# Patient Record
Sex: Female | Born: 1995 | Race: Black or African American | Hispanic: No | Marital: Married | Smoking: Never smoker
Health system: Southern US, Community
[De-identification: ages and names within clinical notes are randomized; demographics above are authoritative.]

## PROBLEM LIST (undated history)

## (undated) ENCOUNTER — Inpatient Hospital Stay (HOSPITAL_COMMUNITY): Payer: Self-pay

## (undated) ENCOUNTER — Inpatient Hospital Stay: Admission: EM | Payer: Self-pay | Source: Home / Self Care

## (undated) DIAGNOSIS — T7840XA Allergy, unspecified, initial encounter: Secondary | ICD-10-CM

## (undated) DIAGNOSIS — Z8613 Personal history of malaria: Secondary | ICD-10-CM

## (undated) HISTORY — DX: Allergy, unspecified, initial encounter: T78.40XA

## (undated) HISTORY — PX: NO PAST SURGERIES: SHX2092

## (undated) HISTORY — DX: Personal history of malaria: Z86.13

---
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
## 2015-04-29 ENCOUNTER — Encounter (HOSPITAL_COMMUNITY): Payer: Self-pay | Admitting: Emergency Medicine

## 2015-04-29 ENCOUNTER — Emergency Department (INDEPENDENT_AMBULATORY_CARE_PROVIDER_SITE_OTHER)
Admission: EM | Admit: 2015-04-29 | Discharge: 2015-04-29 | Disposition: A | Discharge status: Home / Self Care | Payer: Self-pay | Source: Home / Self Care | Attending: Emergency Medicine | Admitting: Emergency Medicine

## 2015-04-29 DIAGNOSIS — R110 Nausea: Secondary | ICD-10-CM

## 2015-04-29 DIAGNOSIS — Z3201 Encounter for pregnancy test, result positive: Secondary | ICD-10-CM

## 2015-04-29 DIAGNOSIS — M545 Low back pain, unspecified: Secondary | ICD-10-CM

## 2015-04-29 DIAGNOSIS — G44209 Tension-type headache, unspecified, not intractable: Secondary | ICD-10-CM

## 2015-04-29 LAB — POCT URINALYSIS DIP (DEVICE)
Bilirubin Urine: NEGATIVE
GLUCOSE, UA: NEGATIVE mg/dL
HGB URINE DIPSTICK: NEGATIVE
Ketones, ur: NEGATIVE mg/dL
LEUKOCYTES UA: NEGATIVE
NITRITE: NEGATIVE
Protein, ur: NEGATIVE mg/dL
Specific Gravity, Urine: 1.025 (ref 1.005–1.030)
UROBILINOGEN UA: 0.2 mg/dL (ref 0.0–1.0)
pH: 7 (ref 5.0–8.0)

## 2015-04-29 LAB — POCT PREGNANCY, URINE: PREG TEST UR: POSITIVE — AB

## 2015-04-29 MED ORDER — ONDANSETRON HCL 4 MG PO TABS: 4.0000 mg | ORAL_TABLET | Freq: Four times a day (QID) | ORAL | Status: DC

## 2015-04-29 NOTE — ED Notes (Signed)
Via [place] phone interpreter C/o HA onset 5 days associated w/chills, BA, weakness and intermittent fevers LMP = 12/2014 Arrived to [place] 5 days ago from [place] from [place]  Alert and oriented x4... No acute distress.

## 2015-04-29 NOTE — Discharge Instructions (Signed)
Back Pain, Adult Low back pain is very common. About 1 in 5 people have back pain.The cause of low back pain is rarely dangerous. The pain often gets better over time.About half of people with a sudden onset of back pain feel better in just 2 weeks. About 8 in 10 people feel better by 6 weeks.  CAUSES Some common causes of back pain include:  Strain of the muscles or ligaments supporting the spine.  Wear and tear (degeneration) of the spinal discs.  Arthritis.  Direct injury to the back. DIAGNOSIS Most of the time, the direct cause of low back pain is not known.However, back pain can be treated effectively even when the exact cause of the pain is unknown.Answering your caregiver's questions about your overall health and symptoms is one of the most accurate ways to make sure the cause of your pain is not dangerous. If your caregiver needs more information, he or she may order lab work or imaging tests (X-rays or MRIs).However, even if imaging tests show changes in your back, this usually does not require surgery. HOME CARE INSTRUCTIONS For many people, back pain returns.Since low back pain is rarely dangerous, it is often a condition that people can learn to [facility] their own.   Remain active. It is stressful on the back to sit or stand in one place. Do not sit, drive, or stand in one place for more than 30 minutes at a time. Take short walks on level surfaces as soon as pain allows.Try to increase the length of time you walk each day.  Do not stay in bed.Resting more than 1 or 2 days can delay your recovery.  Do not avoid exercise or work.Your body is made to move.It is not dangerous to be active, even though your back may hurt.Your back will likely heal faster if you return to being active before your pain is gone.  Pay attention to your body when you bend and lift. Many people have less discomfortwhen lifting if they bend their knees, keep the load close to their bodies,and  avoid twisting. Often, the most comfortable positions are those that put less stress on your recovering back.  Find a comfortable position to sleep. Use a firm mattress and lie on your side with your knees slightly bent. If you lie on your back, put a pillow under your knees.  Only take over-the-counter or prescription medicines as directed by your caregiver. Over-the-counter medicines to reduce pain and inflammation are often the most helpful.Your caregiver may prescribe muscle relaxant drugs.These medicines help dull your pain so you can more quickly return to your normal activities and healthy exercise.  Put ice on the injured area.  Put ice in a plastic bag.  Place a towel between your skin and the bag.  Leave the ice on for 15-20 minutes, 03-04 times a day for the first 2 to 3 days. After that, ice and heat may be alternated to reduce pain and spasms.  Ask your caregiver about trying back exercises and gentle massage. This may be of some benefit.  Avoid feeling anxious or stressed.Stress increases muscle tension and can worsen back pain.It is important to recognize when you are anxious or stressed and learn ways to manage it.Exercise is a great option. SEEK MEDICAL CARE IF:  You have pain that is not relieved with rest or medicine.  You have pain that does not improve in 1 week.  You have new symptoms.  You are generally not feeling well. SEEK  IMMEDIATE MEDICAL CARE IF:   You have pain that radiates from your back into your legs.  You develop new bowel or bladder control problems.  You have unusual weakness or numbness in your arms or legs.  You develop nausea or vomiting.  You develop abdominal pain.  You feel faint. Document Released: 07/19/2005 Document Revised: 01/18/2012 Document Reviewed: 11/20/2013 [facility] Patient Information 2015 [place], [place]. This information is not intended to replace advice given to you by your health care provider. Make sure you  discuss any questions you have with your health care provider.  Nausea, Adult zofran 4 mg every 6 h prn nausea Nausea means you feel sick to your stomach or need to throw up (vomit). It may be a sign of a more serious problem. If nausea gets worse, you may throw up. If you throw up a lot, you may lose too much body fluid (dehydration). HOME CARE   Get plenty of rest.  Ask your doctor how to replace body fluid losses (rehydrate).  Eat small amounts of food. Sip liquids more often.  Take all medicines as told by your doctor. GET HELP RIGHT AWAY IF:  You have a fever.  You pass out (faint).  You keep throwing up or have blood in your throw up.  You are very weak, have dry lips or a dry mouth, or you are very thirsty (dehydrated).  You have dark or bloody poop (stool).  You have very bad chest or belly (abdominal) pain.  You do not get better after 2 days, or you get worse.  You have a headache. MAKE SURE YOU:  Understand these instructions.  Will watch your condition.  Will get help right away if you are not doing well or get worse. Document Released: 07/08/2011 Document Revised: 10/11/2011 Document Reviewed: 07/08/2011 [facility] Patient Information 2015 [place], [place]. This information is not intended to replace advice given to you by your health care provider. Make sure you discuss any questions you have with your health care provider.  Prenatal Care  WHAT IS PRENATAL CARE?  Prenatal care means health care during your pregnancy, before your baby is born. It is very important to take care of yourself and your baby during your pregnancy by:   Getting early prenatal care. If you know you are pregnant, or think you might be pregnant, call your health care provider as soon as possible. Schedule a visit for a prenatal exam.  Getting regular prenatal care. Follow your health care provider's schedule for blood and other necessary tests. Do not miss appointments.  Doing  everything you can to keep yourself and your baby healthy during your pregnancy.  Getting complete care. Prenatal care should include evaluation of the medical, dietary, educational, psychological, and social needs of you and your significant other. The medical and genetic history of your family and the family of your baby's father should be discussed with your health care provider.  Discussing with your health care provider:  Prescription, over-the-counter, and herbal medicines that you take.  Any history of substance abuse, alcohol use, smoking, and illegal drug use.  Any history of domestic abuse and violence.  Immunizations you have received.  Your nutrition and diet.  The amount of exercise you do.  Any environmental and occupational hazards to which you are exposed.  History of sexually transmitted infections for both you and your partner.  Previous pregnancies you have had. WHY IS PRENATAL CARE SO IMPORTANT?  By regularly seeing your health care provider, you help  ensure that problems can be identified early so that they can be treated as soon as possible. Other problems might be prevented. Many studies have shown that early and regular prenatal care is important for the health of mothers and their babies.  HOW CAN I TAKE CARE OF MYSELF WHILE I AM PREGNANT?  Here are ways to take care of yourself and your baby:   Start or continue taking your multivitamin with 400 micrograms (mcg) of folic acid every day.  Get early and regular prenatal care. It is very important to see a health care provider during your pregnancy. Your health care provider will check at each visit to make sure that you and your baby are healthy. If there are any problems, action can be taken right away to help you and your baby.  Eat a healthy diet that includes:  Fruits.  Vegetables.  Foods low in saturated fat.  Whole grains.  Calcium-rich foods, such as milk, yogurt, and hard cheeses.  Drink 6-8  glasses of liquids a day.  Unless your health care provider tells you not to, try to be physically active for 30 minutes, most days of the week. If you are pressed for time, you can get your activity in through 10-minute segments, three times a day.  Do not smoke, drink alcohol, or use drugs. These can cause long-term damage to your baby. Talk with your health care provider about steps to take to stop smoking. Talk with a member of your faith community, a counselor, a trusted friend, or your health care provider if you are concerned about your alcohol or drug use.  Ask your health care provider before taking any medicine, even over-the-counter medicines. Some medicines are not safe to take during pregnancy.  Get plenty of rest and sleep.  Avoid hot tubs and saunas during pregnancy.  Do not have X-rays taken unless absolutely necessary and with the recommendation of your health care provider. A lead shield can be placed on your abdomen to protect your baby when X-rays are taken in other parts of your body.  Do not empty the cat litter when you are pregnant. It may contain a parasite that causes an infection called toxoplasmosis, which can cause birth defects. Also, use gloves when working in garden areas used by cats.  Do not eat uncooked or undercooked meats or fish.  Do not eat soft, mold-ripened cheeses (Brie, Camembert, and chevre) or soft, blue-veined cheese (Danish blue and Roquefort).  Stay away from toxic chemicals like:  Insecticides.  Solvents (some cleaners or paint thinners).  Lead.  Mercury.  Sexual intercourse may continue until the end of the pregnancy, unless you have a medical problem or there is a problem with the pregnancy and your health care provider tells you not to.  Do not wear high-heel shoes, especially during the second half of the pregnancy. You can lose your balance and fall.  Do not take long trips, unless absolutely necessary. Be sure to see your health  care provider before going on the trip.  Do not sit in one position for more than 2 hours when on a trip.  Take a copy of your medical records when going on a trip. Know where a hospital is located in the city you are visiting, in case of an emergency.  Most dangerous household products will have pregnancy warnings on their labels. Ask your health care provider about products if you are unsure.  Limit or eliminate your caffeine intake from coffee, tea,  sodas, medicines, and chocolate.  Many women continue working through pregnancy. Staying active might help you stay healthier. If you have a question about the safety or the hours you work at your particular job, talk with your health care provider.  Get informed:  Read books.  Watch videos.  Go to childbirth classes for you and your significant other.  Talk with experienced moms.  Ask your health care provider about childbirth education classes for you and your partner. Classes can help you and your partner prepare for the birth of your baby.  Ask about a baby doctor (pediatrician) and methods and pain medicine for labor, delivery, and possible cesarean delivery. HOW OFTEN SHOULD I SEE MY HEALTH CARE PROVIDER DURING PREGNANCY?  Your health care provider will give you a schedule for your prenatal visits. You will have visits more often as you get closer to the end of your pregnancy. An average pregnancy lasts about 40 weeks.  A typical schedule includes visiting your health care provider:   About once each month during your first 6 months of pregnancy.  Every 2 weeks during the next 2 months.  Weekly in the last month, until the delivery date. Your health care provider will probably want to see you more often if:  You are older than 35 years.  Your pregnancy is high risk because you have certain health problems or problems with the pregnancy, such as:  Diabetes.  High blood pressure.  The baby is not growing on schedule,  according to the dates of the pregnancy. Your health care provider will do special tests to make sure you and your baby are not having any serious problems. WHAT HAPPENS DURING PRENATAL VISITS?   At your first prenatal visit, your health care provider will do a physical exam and talk to you about your health history and the health history of your partner and your family. Your health care provider will be able to tell you what date to expect your baby to be born on.  Your first physical exam will include checks of your blood pressure, measurements of your height and weight, and an exam of your pelvic organs. Your health care provider will do a Pap test if you have not had one recently and will do cultures of your cervix to make sure there is no infection.  At each prenatal visit, there will be tests of your blood, urine, blood pressure, weight, and the progress of the baby will be checked.  At your later prenatal visits, your health care provider will check how you are doing and how your baby is developing. You may have a number of tests done as your pregnancy progresses.  Ultrasound exams are often used to check on your baby's growth and health.  You may have more urine and blood tests, as well as special tests, if needed. These may include amniocentesis to examine fluid in the pregnancy sac, stress tests to check how the baby responds to contractions, or a biophysical profile to measure your baby's well-being. Your health care provider will explain the tests and why they are necessary.  You should be tested for high blood sugar (gestational diabetes) between the 24th and 28th weeks of your pregnancy.  You should discuss with your health care provider your plans to breastfeed or bottle-feed your baby.  Each visit is also a chance for you to learn about staying healthy during pregnancy and to ask questions. Document Released: 07/22/2003 Document Revised: 07/24/2013 Document Reviewed:  10/03/2013 [employer]  Patient Information 2015 [place], [place]. This information is not intended to replace advice given to you by your health care provider. Make sure you discuss any questions you have with your health care provider.  Tension Headache Tylenol only for headache or pain A tension headache is a feeling of pain, pressure, or aching often felt over the front and sides of the head. The pain can be dull or can feel tight (constricting). It is the most common type of headache. Tension headaches are not normally associated with nausea or vomiting and do not get worse with physical activity. Tension headaches can last 30 minutes to several days.  CAUSES  The exact cause is not known, but it may be caused by chemicals and hormones in the brain that lead to pain. Tension headaches often begin after stress, anxiety, or depression. Other triggers may include:  Alcohol.  Caffeine (too much or withdrawal).  Respiratory infections (colds, flu, sinus infections).  Dental problems or teeth clenching.  Fatigue.  Holding your head and neck in one position too long while using a computer. SYMPTOMS   Pressure around the head.   Dull, aching head pain.   Pain felt over the front and sides of the head.   Tenderness in the muscles of the head, neck, and shoulders. DIAGNOSIS  A tension headache is often diagnosed based on:   Symptoms.   Physical examination.   A CT scan or MRI of your head. These tests may be ordered if symptoms are severe or unusual. TREATMENT  Medicines may be given to help relieve symptoms.  HOME CARE INSTRUCTIONS   Only take over-the-counter or prescription medicines for pain or discomfort as directed by your caregiver.   Lie down in a dark, quiet room when you have a headache.   Keep a journal to find out what may be triggering your headaches. For example, write down:  What you eat and drink.  How much sleep you get.  Any change to your diet or  medicines.  Try massage or other relaxation techniques.   Ice packs or heat applied to the head and neck can be used. Use these 3 to 4 times per day for 15 to 20 minutes each time, or as needed.   Limit stress.   Sit up straight, and do not tense your muscles.   Quit smoking if you smoke.  Limit alcohol use.  Decrease the amount of caffeine you drink, or stop drinking caffeine.  Eat and exercise regularly.  Get 7 to 9 hours of sleep, or as recommended by your caregiver.  Avoid excessive use of pain medicine as recurrent headaches can occur.  SEEK MEDICAL CARE IF:   You have problems with the medicines you were prescribed.  Your medicines do not work.  You have a change from the usual headache.  You have nausea or vomiting. SEEK IMMEDIATE MEDICAL CARE IF:   Your headache becomes severe.  You have a fever.  You have a stiff neck.  You have loss of vision.  You have muscular weakness or loss of muscle control.  You lose your balance or have trouble walking.  You feel faint or pass out.  You have severe symptoms that are different from your first symptoms. MAKE SURE YOU:   Understand these instructions.  Will watch your condition.  Will get help right away if you are not doing well or get worse. Document Released: 07/19/2005 Document Revised: 10/11/2011 Document Reviewed: 07/09/2011 [facility] Patient Information 2015 [place], [place]. This  information is not intended to replace advice given to you by your health care provider. Make sure you discuss any questions you have with your health care provider. ° °

## 2015-04-29 NOTE — ED Provider Notes (Signed)
CSN: [id number]     [id number] & time 04/29/15  1609 History   First MD Initiated Contact with Patient 04/29/15 1639     Chief Complaint  Patient presents with  . Headache   (Consider location/radiation/quality/duration/timing/severity/associated sxs/prior Treatment) HPI Comments: 19 year old female rolled [place] is brought to the urgent care by her caseworker for complaints of headache, chills and back pain for 5 days. She states the headache is located over her eyes and forehead. It is not associated with problems with vision speech, hearing or swallowing. Denies focal paresthesias or weakness. She states that she thinks she may have had fevers off and on for a while but does not have a thermometer. She is also complaining of back pain in the left para lumbar area. This pain is described as sharp and intermittent. It may come at any time at rest, while sitting or standing. It is not exacerbated or elicited  by specific movements. She also notes that her last menstrual period was 4 months ago. She is having early morning nausea. She denies vaginal bleeding or pelvic pain or discharge. She denies seeing vaginal bleeding or rectal bleeding. The history is provided by the patient. The history is limited by a language barrier. A language interpreter was used.    History reviewed. No pertinent past medical history. History reviewed. No pertinent past surgical history. No family history on file. Social History  Substance Use Topics  . Smoking status: Never Smoker   . Smokeless tobacco: None  . Alcohol Use: No   OB History    No data available     Review of Systems  Constitutional: Positive for fever. Negative for activity change and fatigue.  Eyes: Negative for visual disturbance.  Respiratory: Negative for cough, chest tightness and shortness of breath.   Cardiovascular: Negative for chest pain and leg swelling.  Gastrointestinal: Negative.    Genitourinary: Positive for menstrual problem. Negative for dysuria, urgency, frequency, vaginal bleeding, vaginal discharge and pelvic pain.  Skin: Negative for rash.  Neurological: Positive for headaches. Negative for speech difficulty.  All other systems reviewed and are negative.   Allergies  Review of patient's allergies indicates no known allergies.  Home Medications   Prior to Admission medications   Medication Sig Start Date End Date Taking? Authorizing Provider  ondansetron (ZOFRAN) 4 MG tablet Take 1 tablet (4 mg total) by mouth every 6 (six) hours. 04/29/15   Hayden Rasmussen, NP   Meds Ordered and Administered this Visit  Medications - No data to display  BP 102/65 mmHg  Pulse 72  Temp(Src) 98.3 F (36.8 C) (Oral)  Resp 16  SpO2 98%  LMP 12/27/2014 No data found.   Physical Exam  Constitutional: She is oriented to person, place, and time. She appears well-developed and well-nourished. No distress.  HENT:  Head: Normocephalic and atraumatic.  Mouth/Throat: Oropharynx is clear and moist. No oropharyngeal exudate.  Bilateral TMs are normal  Eyes: Conjunctivae and EOM are normal. Pupils are equal, round, and reactive to light.  Neck: Normal range of motion. Neck supple.  Cardiovascular: Normal rate, regular rhythm and normal heart sounds.   Pulmonary/Chest: Effort normal and breath sounds normal. No respiratory distress. She has no wheezes. She has no rales.  Abdominal: Soft. There is no tenderness.  Musculoskeletal: Normal range of motion. She exhibits no edema.  No tenderness is reproduced when palpating the area pain in the left low back. No spinal tenderness. No  deformity, swelling or discoloration.  Lymphadenopathy:    She has no cervical adenopathy.  Neurological: She is alert and oriented to person, place, and time. No cranial nerve deficit.  Skin: Skin is warm and dry.  Psychiatric: She has a normal mood and affect.  Nursing note and vitals reviewed.   ED  Course  Procedures (including critical care time)  Labs Review Labs Reviewed  POCT PREGNANCY, URINE - Abnormal; Notable for the following:    Preg Test, Ur POSITIVE (*)    All other components within normal limits  POCT URINALYSIS DIP (DEVICE)   Results for orders placed or performed during the hospital encounter of 04/29/15  POCT urinalysis dip (device)  Result Value Ref Range   Glucose, UA NEGATIVE NEGATIVE mg/dL   Bilirubin Urine NEGATIVE NEGATIVE   Ketones, ur NEGATIVE NEGATIVE mg/dL   Specific Gravity, Urine 1.025 1.005 - 1.030   Hgb urine dipstick NEGATIVE NEGATIVE   pH 7.0 5.0 - 8.0   Protein, ur NEGATIVE NEGATIVE mg/dL   Urobilinogen, UA 0.2 0.0 - 1.0 mg/dL   Nitrite NEGATIVE NEGATIVE   Leukocytes, UA NEGATIVE NEGATIVE  Pregnancy, urine POC  Result Value Ref Range   Preg Test, Ur POSITIVE (A) NEGATIVE     Imaging Review No results found.   Visual Acuity Review  Right Eye Distance:   Left Eye Distance:   Bilateral Distance:    Right Eye Near:   Left Eye Near:    Bilateral Near:         MDM   1. Pregnancy test positive   2. Nausea   3. Tension headache   4. Left-sided low back pain without sciatica    Patient given Zofran for her nausea. She is advised to take Tylenol as needed for headache but no other types of medication. She has no fever today and am unable to locate a source for history of fever. She is not advised to find a PCP as soon as possible and is given the name of an OB/GYN to follow-up with. She is to give this message to the caretaker and have her assist in obtaining these appointments. The time had bedside speaking to the patient, examination, history, physical and discharge instructions greater than 30 minutes due to language and cultural barriers.   Hayden Rasmussen, NP 04/29/15 2117

## 2015-05-07 ENCOUNTER — Encounter: Payer: Self-pay | Admitting: Obstetrics and Gynecology

## 2015-05-08 ENCOUNTER — Encounter (HOSPITAL_COMMUNITY): Payer: Self-pay | Admitting: *Deleted

## 2015-05-08 ENCOUNTER — Inpatient Hospital Stay (HOSPITAL_COMMUNITY)
Admission: AD | Admit: 2015-05-08 | Discharge: 2015-05-08 | Disposition: A | Discharge status: Home / Self Care | Payer: Self-pay | Source: Ambulatory Visit | Attending: Family Medicine | Admitting: Family Medicine

## 2015-05-08 DIAGNOSIS — O0932 Supervision of pregnancy with insufficient antenatal care, second trimester: Secondary | ICD-10-CM | POA: Insufficient documentation

## 2015-05-08 DIAGNOSIS — Z3A18 18 weeks gestation of pregnancy: Secondary | ICD-10-CM | POA: Insufficient documentation

## 2015-05-08 DIAGNOSIS — O210 Mild hyperemesis gravidarum: Secondary | ICD-10-CM | POA: Insufficient documentation

## 2015-05-08 DIAGNOSIS — O219 Vomiting of pregnancy, unspecified: Secondary | ICD-10-CM

## 2015-05-08 LAB — URINALYSIS, ROUTINE W REFLEX MICROSCOPIC
BILIRUBIN URINE: NEGATIVE
Glucose, UA: NEGATIVE mg/dL
HGB URINE DIPSTICK: NEGATIVE
KETONES UR: NEGATIVE mg/dL
Leukocytes, UA: NEGATIVE
NITRITE: NEGATIVE
PH: 7.5 (ref 5.0–8.0)
Protein, ur: NEGATIVE mg/dL
Specific Gravity, Urine: 1.015 (ref 1.005–1.030)
Urobilinogen, UA: 0.2 mg/dL (ref 0.0–1.0)

## 2015-05-08 MED ORDER — PROMETHAZINE HCL 25 MG PO TABS: 12.5000 mg | ORAL_TABLET | Freq: Four times a day (QID) | ORAL | Status: DC | PRN

## 2015-05-08 MED ORDER — PROMETHAZINE HCL 25 MG PO TABS
25.0000 mg | ORAL_TABLET | Freq: Once | ORAL | Status: AC
  Administered 2015-05-08: 25 mg via ORAL
  Filled 2015-05-08: qty 1

## 2015-05-08 MED ORDER — ACETAMINOPHEN 500 MG PO TABS
1000.0000 mg | ORAL_TABLET | Freq: Once | ORAL | Status: AC
  Administered 2015-05-08: 1000 mg via ORAL
  Filled 2015-05-08: qty 2

## 2015-05-08 NOTE — MAU Note (Signed)
Pt presents to MAU with complaints of nausea, vomiting, and dizziness that started today. Denies any vaginal bleeding or discharge

## 2015-05-08 NOTE — Discharge Instructions (Signed)

## 2015-05-08 NOTE — MAU Provider Note (Signed)
Chief Complaint: Headache; Emesis; and Dizziness   First Provider Initiated Contact with Patient 05/08/15 2110      SUBJECTIVE HPI: Sandra Sexton is a 19 y.o. G2P0 at [redacted]w[redacted]d by LMP who presents to maternity admissions reporting nausea, vomiting, h/a, and chills.  She reports some n/v throughout pregnancy but it became worse and was accompanied by chills and h/a in the last 4 days.  She was seen at [facility] on 9/27 for n/v and prescribed Zofran 4 mg tablets. These did help but she ran out of the medication.  She denies abdominal pain, vaginal bleeding, vaginal itching/burning, urinary symptoms, h/a, dizziness, n/v, or fever/chills.     Headache  This is a new problem. The current episode started in the past 7 days. The problem occurs constantly. The problem has been unchanged. The pain is located in the frontal region. The pain does not radiate. The pain quality is not similar to prior headaches. The quality of the pain is described as dull. The pain is mild. Pertinent negatives include no back pain, dizziness, fever, nausea, neck pain, photophobia, sinus pressure, vomiting or weakness. Nothing aggravates the symptoms. She has tried nothing for the symptoms.  Emesis  This is a recurrent problem. The current episode started 1 to 4 weeks ago. The problem occurs 5 to 10 times per day. The problem has been gradually worsening. The emesis has an appearance of stomach contents. There has been no fever. Pertinent negatives include no chest pain, chills, diarrhea, dizziness, fever, headaches or URI. She has tried nothing for the symptoms.    History reviewed. No pertinent past medical history. History reviewed. No pertinent past surgical history. Social History   Social History  . Marital Status: Single    Spouse Name: N/A  . Number of Children: N/A  . Years of Education: N/A   Occupational History  . Not on file.   Social History Main Topics  . Smoking status: Never Smoker   . Smokeless tobacco:  Not on file  . Alcohol Use: No  . Drug Use: No  . Sexual Activity: Not on file   Other Topics Concern  . Not on file   Social History Narrative   No current facility-administered medications on file prior to encounter.   Current Outpatient Prescriptions on File Prior to Encounter  Medication Sig Dispense Refill  . ondansetron (ZOFRAN) 4 MG tablet Take 1 tablet (4 mg total) by mouth every 6 (six) hours. 6 tablet 0   No Known Allergies  ROS:  Review of Systems  Constitutional: Negative for fever, chills and fatigue.  HENT: Negative for sinus pressure.   Eyes: Negative for photophobia.  Respiratory: Negative for shortness of breath.   Cardiovascular: Negative for chest pain.  Gastrointestinal: Negative for nausea, vomiting, diarrhea and constipation.  Genitourinary: Negative for dysuria, frequency, flank pain, vaginal bleeding, vaginal discharge, difficulty urinating, vaginal pain and pelvic pain.  Musculoskeletal: Negative for back pain and neck pain.  Neurological: Negative for dizziness, weakness and headaches.  Psychiatric/Behavioral: Negative.      I have reviewed patient's Past Medical Hx, Surgical Hx, Family Hx, Social Hx, medications and allergies.   Physical Exam   Patient Vitals for the past 24 hrs:  BP Temp Pulse Resp Height Weight  05/08/15 1811 - - - -  (1.549 m) 58.968 kg (130 lb)  05/08/15 1810 108/56 mmHg - 79 18 - -  05/08/15 1808 - 98.5 F (36.9 C) - - - -   Constitutional: Well-developed, well-nourished female  in no acute distress.  Cardiovascular: normal rate Respiratory: normal effort GI: Abd soft, non-tender. Pos BS x 4 MS: Extremities nontender, no edema, normal ROM Neurologic: Alert and oriented x 4.  GU: Neg CVAT.  FHT 158 by doppler  LAB RESULTS Results for orders placed or performed during the hospital encounter of 05/08/15 (from the past 24 hour(s))  Urinalysis, Routine w reflex microscopic (not at [facility])     Status: None    Collection Time: 05/08/15  6:10 PM  Result Value Ref Range   Color, Urine YELLOW YELLOW   APPearance CLEAR CLEAR   Specific Gravity, Urine 1.015 1.005 - 1.030   pH 7.5 5.0 - 8.0   Glucose, UA NEGATIVE NEGATIVE mg/dL   Hgb urine dipstick NEGATIVE NEGATIVE   Bilirubin Urine NEGATIVE NEGATIVE   Ketones, ur NEGATIVE NEGATIVE mg/dL   Protein, ur NEGATIVE NEGATIVE mg/dL   Urobilinogen, UA 0.2 0.0 - 1.0 mg/dL   Nitrite NEGATIVE NEGATIVE   Leukocytes, UA NEGATIVE NEGATIVE       IMAGING No results found.  MAU Management/MDM: Ordered labs and reviewed results.  Treatments in MAU included Phenergan 25 mg PO and Tylenol 1000 mg PO with pt report of symptom relief. Pt stable at time of discharge.  ASSESSMENT 1. Nausea and vomiting during pregnancy prior to [redacted] weeks gestation   2. Late prenatal care, second trimester     PLAN Discharge home Message sent to [facility] to start care r/t late to care Outpatient [place] ordered Phenergan Rx printed for pt   Follow-up Information    Follow up with [facility]   Specialty:  Obstetrics and Gynecology   Why:  The clinic will call you with appointment.   Contact information:   [address] [place] [address] [phone]      Follow up with [facility] [place] MATERNITY ADMISSIONS.   Why:  As needed for emergencies   Contact information:   [address] [id number] mc [place] [address] [phone]      Sharen Counter Certified Nurse-Midwife 05/08/2015  10:13 PM

## 2015-05-13 ENCOUNTER — Encounter: Payer: Self-pay | Admitting: Obstetrics and Gynecology

## 2015-05-20 ENCOUNTER — Encounter: Payer: Self-pay | Admitting: Family

## 2015-05-20 ENCOUNTER — Ambulatory Visit (INDEPENDENT_AMBULATORY_CARE_PROVIDER_SITE_OTHER): Payer: Self-pay | Admitting: Family

## 2015-05-20 VITALS — BP 104/58 | HR 87 | Temp 99.0°F | Wt 131.1 lb

## 2015-05-20 DIAGNOSIS — Z113 Encounter for screening for infections with a predominantly sexual mode of transmission: Secondary | ICD-10-CM

## 2015-05-20 DIAGNOSIS — Z3490 Encounter for supervision of normal pregnancy, unspecified, unspecified trimester: Secondary | ICD-10-CM | POA: Insufficient documentation

## 2015-05-20 DIAGNOSIS — Z789 Other specified health status: Secondary | ICD-10-CM

## 2015-05-20 DIAGNOSIS — Z3492 Encounter for supervision of normal pregnancy, unspecified, second trimester: Secondary | ICD-10-CM

## 2015-05-20 DIAGNOSIS — Z23 Encounter for immunization: Secondary | ICD-10-CM

## 2015-05-20 LAB — POCT URINALYSIS DIP (DEVICE)
Bilirubin Urine: NEGATIVE
GLUCOSE, UA: NEGATIVE mg/dL
Hgb urine dipstick: NEGATIVE
KETONES UR: NEGATIVE mg/dL
LEUKOCYTES UA: NEGATIVE
Nitrite: NEGATIVE
Protein, ur: NEGATIVE mg/dL
SPECIFIC GRAVITY, URINE: 1.02 (ref 1.005–1.030)
UROBILINOGEN UA: 0.2 mg/dL (ref 0.0–1.0)
pH: 7.5 (ref 5.0–8.0)

## 2015-05-20 NOTE — Progress Notes (Signed)
Breastfeeding tip of the week reviewed Initial prenatal labs today [employer] interpreter [phone]

## 2015-05-20 NOTE — Progress Notes (Signed)
    Subjective:    Sandra Sexton is a G2P1001 7755w4d being seen today for her first obstetrical visit.  Her obstetrical history is significant for current immigrant/refuge[place] from [place].  Entire family still in [place].  Pt states dropped off and gets home via bus - pt concerned ability to get home.   States was shown the bus stop to go to.  Patient does intend to breast feed. Pregnancy history fully reviewed.  Patient reports no complaints.  Filed Vitals:   05/20/15 0907  BP: 104/58  Pulse: 87  Temp: 99 F (37.2 C)  Weight: 131 lb 1.6 oz (59.467 kg)    HISTORY: OB History  Gravida Para Term Preterm AB SAB TAB Ectopic Multiple Living  2 1 1       1     # Outcome Date GA Lbr Len/2nd Weight Sex Delivery Anes PTL Lv  2 Current           1 Term 11/03/13    F Vag-Spont  N Y     Past Medical History  Diagnosis Date  . H/O malaria 2015    [place] in [place]   Past Surgical History  Procedure Laterality Date  . No past surgeries     History reviewed. No pertinent family history.   Exam   BP 104/58 mmHg  Pulse 87  Temp(Src) 99 F (37.2 C)  Wt 131 lb 1.6 oz (59.467 kg)  LMP 12/27/2014 (Approximate) Uterine Size: size equals dates  Pelvic Exam:    Perineum: No Hemorrhoids, Normal Perineum   Vulva: normal   Vagina:  normal mucosa, normal discharge, no palpable nodules   pH: Not done   Cervix: no bleeding, no cervical motion tenderness and no lesions   Adnexa: normal adnexa and no mass, fullness, tenderness   Bony Pelvis: Adequate  System: Breast:  No nipple retraction or dimpling, No nipple discharge or bleeding, No axillary or supraclavicular adenopathy, Normal to palpation without dominant masses   Skin: normal coloration and turgor, no rashes    Neurologic: negative   Extremities: normal strength, tone, and muscle mass   HEENT neck supple with midline trachea and thyroid without masses   Mouth/Teeth mucous membranes moist, pharynx normal without lesions    Neck supple and no masses   Cardiovascular: regular rate and rhythm, no murmurs or gallops   Respiratory:  appears well, vitals normal, no respiratory distress, acyanotic, normal RR, neck free of mass or lymphadenopathy, chest clear, no wheezing, crepitations, rhonchi, normal symmetric air entry   Abdomen: soft, non-tender; bowel sounds normal; no masses,  no organomegaly   Urinary: urethral meatus normal     Assessment:    Pregnancy: G2P1001 Patient Active Problem List   Diagnosis Date Noted  . Supervision of normal pregnancy 05/20/2015  Refugee      Plan:     Initial labs drawn. Prenatal vitamins. Given written information to give bus driver with address to assist with transportation.   Problem list reviewed and updated. Genetic Screening discussed Quad Screen: ordered.  Ultrasound discussed; fetal survey: ordered.  Follow up in 4 weeks.  Elenora FenderKARIM, [facility] Regional Medical CenterWALIDAH N 05/20/2015  *Utilized language line to receive and convey all information.

## 2015-05-21 ENCOUNTER — Ambulatory Visit (HOSPITAL_COMMUNITY): Admission: RE | Admit: 2015-05-21 | Payer: Self-pay | Source: Ambulatory Visit

## 2015-05-21 ENCOUNTER — Other Ambulatory Visit (HOSPITAL_COMMUNITY): Payer: Self-pay | Admitting: Advanced Practice Midwife

## 2015-05-21 DIAGNOSIS — O0932 Supervision of pregnancy with insufficient antenatal care, second trimester: Secondary | ICD-10-CM

## 2015-05-21 DIAGNOSIS — Z3689 Encounter for other specified antenatal screening: Secondary | ICD-10-CM

## 2015-05-21 DIAGNOSIS — Z3A20 20 weeks gestation of pregnancy: Secondary | ICD-10-CM

## 2015-05-21 LAB — PRESCRIPTION MONITORING PROFILE (SOLSTAS)
Amphetamine/Meth: NEGATIVE ng/mL
BARBITURATE SCREEN, URINE: NEGATIVE ng/mL
BENZODIAZEPINE SCREEN, URINE: NEGATIVE ng/mL
BUPRENORPHINE, URINE: NEGATIVE ng/mL
COCAINE METABOLITES: NEGATIVE ng/mL
CREATININE, URINE: 164.16 mg/dL (ref >20.0)
Cannabinoid Scrn, Ur: NEGATIVE ng/mL
Carisoprodol, Urine: NEGATIVE ng/mL
ECSTASY: NEGATIVE ng/mL
FENTANYL URINE: NEGATIVE ng/mL
Meperidine, Ur: NEGATIVE ng/mL
Methadone Screen, Urine: NEGATIVE ng/mL
Nitrites, Initial: NEGATIVE ug/mL
OPIATE SCREEN, URINE: NEGATIVE ng/mL
Oxycodone Screen, Ur: NEGATIVE ng/mL
PROPOXYPHENE: NEGATIVE ng/mL
TAPENTADOLUR: NEGATIVE ng/mL
Tramadol Scrn, Ur: NEGATIVE ng/mL
Zolpidem, Urine: NEGATIVE ng/mL
pH, Initial: 7.5 pH (ref 4.5–8.9)

## 2015-05-21 LAB — OBSTETRIC PANEL
Antibody Screen: NEGATIVE
BASOS ABS: 0 10*3/uL (ref 0.0–0.1)
BASOS PCT: 0 % (ref 0–1)
EOS ABS: 0.2 10*3/uL (ref 0.0–0.7)
Eosinophils Relative: 4 % (ref 0–5)
HEMATOCRIT: 38.2 % (ref 36.0–46.0)
HEMOGLOBIN: 12.7 g/dL (ref 12.0–15.0)
Hepatitis B Surface Ag: NEGATIVE
LYMPHS ABS: 1.8 10*3/uL (ref 0.7–4.0)
Lymphocytes Relative: 42 % (ref 12–46)
MCH: 29.7 pg (ref 26.0–34.0)
MCHC: 33.2 g/dL (ref 30.0–36.0)
MCV: 89.5 fL (ref 78.0–100.0)
MPV: 12.4 fL (ref 8.6–12.4)
Monocytes Absolute: 0.2 10*3/uL (ref 0.1–1.0)
Monocytes Relative: 5 % (ref 3–12)
NEUTROS PCT: 49 % (ref 43–77)
Neutro Abs: 2.1 10*3/uL (ref 1.7–7.7)
Platelets: 169 10*3/uL (ref 150–400)
RBC: 4.27 MIL/uL (ref 3.87–5.11)
RDW: 12.6 % (ref 11.5–15.5)
RUBELLA: 23 {index} — AB (ref <0.90)
Rh Type: POSITIVE
WBC: 4.2 10*3/uL (ref 4.0–10.5)

## 2015-05-21 LAB — GC/CHLAMYDIA PROBE AMP (~~LOC~~) NOT AT ARMC
Chlamydia: NEGATIVE
NEISSERIA GONORRHEA: NEGATIVE

## 2015-05-21 LAB — CULTURE, OB URINE
Colony Count: NO GROWTH
Organism ID, Bacteria: NO GROWTH

## 2015-05-22 LAB — HEMOGLOBINOPATHY EVALUATION
HEMOGLOBIN OTHER: 0 %
Hgb A2 Quant: 3 % (ref 2.2–3.2)
Hgb A: 96.5 % — ABNORMAL LOW (ref 96.8–97.8)
Hgb F Quant: 0.5 % (ref 0.0–2.0)
Hgb S Quant: 0 %

## 2015-05-26 ENCOUNTER — Ambulatory Visit (HOSPITAL_COMMUNITY)
Admission: RE | Admit: 2015-05-26 | Discharge: 2015-05-26 | Disposition: A | Discharge status: Home / Self Care | Payer: Self-pay | Source: Ambulatory Visit | Attending: Advanced Practice Midwife | Admitting: Advanced Practice Midwife

## 2015-05-26 DIAGNOSIS — Z3A20 20 weeks gestation of pregnancy: Secondary | ICD-10-CM | POA: Insufficient documentation

## 2015-05-26 DIAGNOSIS — Z3492 Encounter for supervision of normal pregnancy, unspecified, second trimester: Secondary | ICD-10-CM

## 2015-05-26 DIAGNOSIS — Z36 Encounter for antenatal screening of mother: Secondary | ICD-10-CM | POA: Insufficient documentation

## 2015-05-26 DIAGNOSIS — O0932 Supervision of pregnancy with insufficient antenatal care, second trimester: Secondary | ICD-10-CM | POA: Insufficient documentation

## 2015-05-26 DIAGNOSIS — Z3689 Encounter for other specified antenatal screening: Secondary | ICD-10-CM

## 2015-05-27 ENCOUNTER — Encounter: Payer: Self-pay | Admitting: Family

## 2015-06-04 ENCOUNTER — Telehealth: Payer: Self-pay | Admitting: Family

## 2015-06-04 LAB — AFP, QUAD SCREEN
AFP: 34.3 ng/mL
Age Alone: 1:1170 {titer}
CURR GEST AGE: 14.5 wks.days
HCG, Total: 61.19 IU/mL
INH: 97.4 pg/mL
INTERPRETATION-AFP: NEGATIVE
MOM FOR AFP: 1.19
MoM for INH: 0.48
MoM for hCG: 1.01
Open Spina bifida: NEGATIVE
TRI 18 SCR RISK EST: NEGATIVE
UE3 MOM: 0.95
uE3 Value: 0.56 ng/mL

## 2015-06-04 NOTE — Telephone Encounter (Signed)
Pt called to inform regarding needing to redraw or recalculate QUAD screen.  Results based on incorrect EGA by ultrasound.  EDC changed.  Will redraw at appt on 06/17/15.

## 2015-06-17 ENCOUNTER — Ambulatory Visit (INDEPENDENT_AMBULATORY_CARE_PROVIDER_SITE_OTHER): Payer: Medicaid Other | Admitting: Advanced Practice Midwife

## 2015-06-17 ENCOUNTER — Encounter: Payer: Self-pay | Admitting: Advanced Practice Midwife

## 2015-06-17 VITALS — BP 106/52 | HR 74 | Temp 98.2°F | Wt 139.0 lb

## 2015-06-17 DIAGNOSIS — Z3492 Encounter for supervision of normal pregnancy, unspecified, second trimester: Secondary | ICD-10-CM

## 2015-06-17 LAB — POCT URINALYSIS DIP (DEVICE)
BILIRUBIN URINE: NEGATIVE
Glucose, UA: NEGATIVE mg/dL
HGB URINE DIPSTICK: NEGATIVE
Ketones, ur: NEGATIVE mg/dL
LEUKOCYTES UA: NEGATIVE
NITRITE: NEGATIVE
Protein, ur: NEGATIVE mg/dL
Specific Gravity, Urine: 1.02 (ref 1.005–1.030)
UROBILINOGEN UA: 0.2 mg/dL (ref 0.0–1.0)
pH: 7 (ref 5.0–8.0)

## 2015-06-17 MED ORDER — PRENATAL VITAMINS 0.8 MG PO TABS: 1.0000 | ORAL_TABLET | Freq: Every day | ORAL | Status: DC

## 2015-06-17 NOTE — Progress Notes (Signed)
Subjective:  Sandra Sexton is a 19 y.o. G2P1001 at 6665w5d being seen today for ongoing prenatal care.  Patient reports no complaints.  Contractions: Not present.  Vag. Bleeding: None. Movement: Present. Denies leaking of fluid.   The following portions of the patient's history were reviewed and updated as appropriate: allergies, current medications, past family history, past medical history, past social history, past surgical history and problem list. Problem list updated.  Objective:   Filed Vitals:   06/17/15 1011  BP: 106/52  Pulse: 74  Temp: 98.2 F (36.8 C)  Weight: 139 lb (63.05 kg)    Fetal Status: Fetal Heart Rate (bpm): 151   Movement: Present     General:  Alert, oriented and cooperative. Patient is in no acute distress.  Skin: Skin is warm and dry. No rash noted.   Cardiovascular: Normal heart rate noted  Respiratory: Normal respiratory effort, no problems with respiration noted  Abdomen: Soft, gravid, appropriate for gestational age. Pain/Pressure: Absent     Pelvic: Vag. Bleeding: None     Cervical exam deferred        Extremities: Normal range of motion.  Edema: None  Mental Status: Normal mood and affect. Normal behavior. Normal judgment and thought content.   Urinalysis: Urine Protein: Negative Urine Glucose: Negative  Assessment and Plan:  Pregnancy: G2P1001 at 3665w5d  1. Supervision of normal pregnancy, second trimester       - US MFM OB FOLLOW UP; Future  Preterm labor symptoms and general obstetric precautions including but not limited to vaginal bleeding, contractions, leaking of fluid and fetal movement were reviewed in detail with the patient. Please refer to After Visit Summary for other counseling recommendations.   RTC in 4 weeks   Aviva SignsMarie L Wilma Wuthrich, CNM

## 2015-06-17 NOTE — Progress Notes (Signed)
Patient reports headache for past 3 days; denies dizziness or blurry vision; discussed using tylenol as needed [place] interpreter [phone] Discussed new edd with patient Informed patient of u/s appt.

## 2015-06-17 NOTE — Patient Instructions (Signed)

## 2015-06-20 ENCOUNTER — Ambulatory Visit (HOSPITAL_COMMUNITY)
Admission: RE | Admit: 2015-06-20 | Discharge: 2015-06-20 | Disposition: A | Discharge status: Home / Self Care | Payer: Medicaid Other | Source: Ambulatory Visit | Attending: Advanced Practice Midwife | Admitting: Advanced Practice Midwife

## 2015-06-20 ENCOUNTER — Other Ambulatory Visit: Payer: Self-pay | Admitting: General Practice

## 2015-06-20 DIAGNOSIS — Z1389 Encounter for screening for other disorder: Secondary | ICD-10-CM

## 2015-06-20 DIAGNOSIS — Z3A19 19 weeks gestation of pregnancy: Secondary | ICD-10-CM | POA: Diagnosis not present

## 2015-06-20 DIAGNOSIS — Z36 Encounter for antenatal screening of mother: Secondary | ICD-10-CM | POA: Diagnosis not present

## 2015-06-20 DIAGNOSIS — Z3492 Encounter for supervision of normal pregnancy, unspecified, second trimester: Secondary | ICD-10-CM

## 2015-07-15 ENCOUNTER — Encounter: Payer: Self-pay | Admitting: Certified Nurse Midwife

## 2015-07-15 ENCOUNTER — Encounter: Payer: Medicaid Other | Admitting: Certified Nurse Midwife

## 2015-07-22 ENCOUNTER — Encounter: Payer: Medicaid Other | Admitting: Student

## 2015-07-23 ENCOUNTER — Ambulatory Visit (INDEPENDENT_AMBULATORY_CARE_PROVIDER_SITE_OTHER): Payer: Medicaid Other | Admitting: Advanced Practice Midwife

## 2015-07-23 VITALS — BP 103/55 | HR 79 | Temp 98.4°F | Wt 145.1 lb

## 2015-07-23 DIAGNOSIS — Z3492 Encounter for supervision of normal pregnancy, unspecified, second trimester: Secondary | ICD-10-CM | POA: Diagnosis not present

## 2015-07-23 LAB — POCT URINALYSIS DIP (DEVICE)
BILIRUBIN URINE: NEGATIVE
Glucose, UA: NEGATIVE mg/dL
Hgb urine dipstick: NEGATIVE
Ketones, ur: NEGATIVE mg/dL
LEUKOCYTES UA: NEGATIVE
NITRITE: NEGATIVE
Protein, ur: NEGATIVE mg/dL
Specific Gravity, Urine: 1.02 (ref 1.005–1.030)
Urobilinogen, UA: 0.2 mg/dL (ref 0.0–1.0)
pH: 7.5 (ref 5.0–8.0)

## 2015-07-23 NOTE — Progress Notes (Signed)
Subjective:  Sandra Sexton is a 19 y.o. G2P1001 at 6455w6d being seen today for ongoing prenatal care.  She is currently monitored for the following issues for this low-risk pregnancy and has Supervision of normal pregnancy and Language barrier affecting health care on her problem list.  Patient reports no complaints.  Contractions: Not present. Vag. Bleeding: None.  Movement: Present. Denies leaking of fluid.   The following portions of the patient's history were reviewed and updated as appropriate: allergies, current medications, past family history, past medical history, past social history, past surgical history and problem list. Problem list updated.  Objective:   Filed Vitals:   07/23/15 1009  BP: 103/55  Pulse: 79  Temp: 98.4 F (36.9 C)  Weight: 145 lb 1.6 oz (65.817 kg)    Fetal Status: Fetal Heart Rate (bpm): 143   Movement: Present     General:  Alert, oriented and cooperative. Patient is in no acute distress.  Skin: Skin is warm and dry. No rash noted.   Cardiovascular: Normal heart rate noted  Respiratory: Normal respiratory effort, no problems with respiration noted  Abdomen: Soft, gravid, appropriate for gestational age. Pain/Pressure: Absent     Pelvic: Vag. Bleeding: None     Cervical exam deferred        Extremities: Normal range of motion.  Edema: None  Mental Status: Normal mood and affect. Normal behavior. Normal judgment and thought content.   Urinalysis: Urine Protein: Negative Urine Glucose: Negative  Assessment and Plan:  Pregnancy: G2P1001 at 4955w6d  1. Supervision of normal pregnancy, second trimester   Preterm labor symptoms and general obstetric precautions including but not limited to vaginal bleeding, contractions, leaking of fluid and fetal movement were reviewed in detail with the patient. Please refer to After Visit Summary for other counseling recommendations.  Return in about 4 weeks (around 08/20/2015).   Hurshel PartyLisa A Leftwich-Kirby, CNM

## 2015-07-23 NOTE — Progress Notes (Signed)
[employer] Interpreter # [phone]

## 2015-08-03 NOTE — L&D Delivery Note (Signed)
Delivery Note At 2:45 PM a viable and healthy female was delivered via Vaginal, Spontaneous Delivery (Presentation: Right Occiput Anterior).  Baby slow to transition, but good tone and HR. APGAR: 8, 9; weight 7 lb 6 oz (3345 g).   Placenta status: Intact, Spontaneous.  Cord: 3 vessels with the following complications: None.  Cord pH: NA  Anesthesia: Nitrous  Episiotomy: None Lacerations: None Suture Repair: NA Est. Blood Loss (mL): 50  Mom to postpartum.  Baby to Couplet care / Skin to Skin. Placenta to: BS Feeding: Breast Circ: NA Contraception: depo  Sandra Sexton 11/15/2015, 4:16 PM

## 2015-08-25 ENCOUNTER — Ambulatory Visit (INDEPENDENT_AMBULATORY_CARE_PROVIDER_SITE_OTHER): Payer: Medicaid Other | Admitting: Advanced Practice Midwife

## 2015-08-25 VITALS — BP 104/59 | HR 71 | Temp 98.3°F | Wt 150.5 lb

## 2015-08-25 DIAGNOSIS — Z3492 Encounter for supervision of normal pregnancy, unspecified, second trimester: Secondary | ICD-10-CM

## 2015-08-25 DIAGNOSIS — Z789 Other specified health status: Secondary | ICD-10-CM

## 2015-08-25 NOTE — Progress Notes (Signed)
Breastfeeding tip of the week reviewed Needs 28 week labs scheduled

## 2015-08-25 NOTE — Progress Notes (Signed)
Subjective:  Sandra Sexton is a 20 y.o. G2P1001 at [redacted]w[redacted]d being seen today for ongoing prenatal care.  She is currently monitored for the following issues for this low-risk pregnancy and has Supervision of normal pregnancy and Language barrier affecting health care on her problem list.  Patient reports no complaints.  Contractions: Not present. Vag. Bleeding: None.  Movement: Present. Denies leaking of fluid.   The following portions of the patient's history were reviewed and updated as appropriate: allergies, current medications, past family history, past medical history, past social history, past surgical history and problem list. Problem list updated.  Objective:   Filed Vitals:   08/25/15 1539  BP: 104/59  Pulse: 71  Temp: 98.3 F (36.8 C)  Weight: 150 lb 8 oz (68.266 kg)    Fetal Status: Fetal Heart Rate (bpm): 145   Movement: Present     General:  Alert, oriented and cooperative. Patient is in no acute distress.  Skin: Skin is warm and dry. No rash noted.   Cardiovascular: Normal heart rate noted  Respiratory: Normal respiratory effort, no problems with respiration noted  Abdomen: Soft, gravid, appropriate for gestational age. Pain/Pressure: Present     Pelvic: Vag. Bleeding: None Vag D/C Character: Watery   Cervical exam deferred        Extremities: Normal range of motion.  Edema: None  Mental Status: Normal mood and affect. Normal behavior. Normal judgment and thought content.   Urinalysis: Urine Protein: Negative Urine Glucose: Negative  Assessment and Plan:  Pregnancy: G2P1001 at [redacted]w[redacted]d  1. Supervision of normal pregnancy, second trimester --28 week labs not drawn, pt called back to room too late to draw during office hours.  Pt was here for appt on time so for her convenience, pt rescheduled for labs and OB visit next week.  Then can return to Q 2 week visits.   2. Language barrier affecting health care  Preterm labor symptoms and general obstetric precautions  including but not limited to vaginal bleeding, contractions, leaking of fluid and fetal movement were reviewed in detail with the patient. Please refer to After Visit Summary for other counseling recommendations.  Return in about 1 week (around 09/01/2015).   Hurshel Party, CNM

## 2015-09-01 ENCOUNTER — Ambulatory Visit (INDEPENDENT_AMBULATORY_CARE_PROVIDER_SITE_OTHER): Payer: Medicaid Other | Admitting: Obstetrics and Gynecology

## 2015-09-01 VITALS — BP 106/58 | HR 79 | Temp 98.3°F | Wt 151.7 lb

## 2015-09-01 DIAGNOSIS — Z3493 Encounter for supervision of normal pregnancy, unspecified, third trimester: Secondary | ICD-10-CM | POA: Diagnosis present

## 2015-09-01 DIAGNOSIS — Z3490 Encounter for supervision of normal pregnancy, unspecified, unspecified trimester: Secondary | ICD-10-CM

## 2015-09-01 DIAGNOSIS — Z23 Encounter for immunization: Secondary | ICD-10-CM

## 2015-09-01 LAB — POCT URINALYSIS DIP (DEVICE)
BILIRUBIN URINE: NEGATIVE
GLUCOSE, UA: NEGATIVE mg/dL
Hgb urine dipstick: NEGATIVE
KETONES UR: NEGATIVE mg/dL
LEUKOCYTES UA: NEGATIVE
Nitrite: NEGATIVE
PROTEIN: NEGATIVE mg/dL
Specific Gravity, Urine: 1.02 (ref 1.005–1.030)
Urobilinogen, UA: 0.2 mg/dL (ref 0.0–1.0)
pH: 7 (ref 5.0–8.0)

## 2015-09-01 LAB — CBC
HCT: 34.1 % — ABNORMAL LOW (ref 36.0–46.0)
Hemoglobin: 11.4 g/dL — ABNORMAL LOW (ref 12.0–15.0)
MCH: 31.1 pg (ref 26.0–34.0)
MCHC: 33.4 g/dL (ref 30.0–36.0)
MCV: 92.9 fL (ref 78.0–100.0)
MPV: 10.8 fL (ref 8.6–12.4)
PLATELETS: 151 10*3/uL (ref 150–400)
RBC: 3.67 MIL/uL — ABNORMAL LOW (ref 3.87–5.11)
RDW: 12.3 % (ref 11.5–15.5)
WBC: 5.1 10*3/uL (ref 4.0–10.5)

## 2015-09-01 MED ORDER — TETANUS-DIPHTH-ACELL PERTUSSIS 5-2.5-18.5 LF-MCG/0.5 IM SUSP
0.5000 mL | Freq: Once | INTRAMUSCULAR | Status: AC
  Administered 2015-09-01: 0.5 mL via INTRAMUSCULAR

## 2015-09-01 NOTE — Progress Notes (Signed)
Subjective:  Sandra Sexton is a 20 y.o. G2P1001 at [redacted]w[redacted]d being seen today for ongoing prenatal care.  She is currently monitored for the following issues for this low-risk pregnancy and has Supervision of normal pregnancy and Language barrier affecting health care on her problem list.  Patient reports no complaints.  Contractions: Not present. Vag. Bleeding: None.  Movement: Present. Denies leaking of fluid.   The following portions of the patient's history were reviewed and updated as appropriate: allergies, current medications, past family history, past medical history, past social history, past surgical history and problem list. Problem list updated.  Objective:   Filed Vitals:   09/01/15 1202  BP: 106/58  Pulse: 79  Temp: 98.3 F (36.8 C)  Weight: 151 lb 11.2 oz (68.811 kg)    Fetal Status: Fetal Heart Rate (bpm): 140   Movement: Present     General:  Alert, oriented and cooperative. Patient is in no acute distress.  Skin: Skin is warm and dry. No rash noted.   Cardiovascular: Normal heart rate noted  Respiratory: Normal respiratory effort, no problems with respiration noted  Abdomen: Soft, gravid, appropriate for gestational age. Pain/Pressure: Absent     Pelvic: Vag. Bleeding: None     Cervical exam deferred        Extremities: Normal range of motion.  Edema: None  Mental Status: Normal mood and affect. Normal behavior. Normal judgment and thought content.   Urinalysis: Urine Protein: Negative Urine Glucose: Negative  Assessment and Plan:  Pregnancy: G2P1001 at [redacted]w[redacted]d  1. Supervision of normal pregnancy, unspecified trimester - Glucose Tolerance, 1 HR (50g) w/o Fasting - HIV antibody (with reflex) - RPR - CBC - larc discussin today  Preterm labor symptoms and general obstetric precautions including but not limited to vaginal bleeding, contractions, leaking of fluid and fetal movement were reviewed in detail with the patient. Please refer to After Visit Summary for  other counseling recommendations.  F/u 4 weeks  Kathrynn Running, MD

## 2015-09-01 NOTE — Progress Notes (Signed)
kinyar[place] interpreter [phone] 1hr gtt, 28 week labs today Tdap today

## 2015-09-02 LAB — RPR

## 2015-09-02 LAB — HIV ANTIBODY (ROUTINE TESTING W REFLEX): HIV: NONREACTIVE

## 2015-09-02 LAB — GLUCOSE TOLERANCE, 1 HOUR (50G) W/O FASTING: GLUCOSE 1 HOUR GTT: 55 mg/dL — AB (ref 70–140)

## 2015-09-16 ENCOUNTER — Ambulatory Visit (INDEPENDENT_AMBULATORY_CARE_PROVIDER_SITE_OTHER): Payer: Medicaid Other | Admitting: Advanced Practice Midwife

## 2015-09-16 VITALS — BP 97/57 | HR 85 | Temp 98.6°F | Wt 151.9 lb

## 2015-09-16 DIAGNOSIS — Z3490 Encounter for supervision of normal pregnancy, unspecified, unspecified trimester: Secondary | ICD-10-CM

## 2015-09-16 DIAGNOSIS — Z3493 Encounter for supervision of normal pregnancy, unspecified, third trimester: Secondary | ICD-10-CM | POA: Diagnosis not present

## 2015-09-16 DIAGNOSIS — J069 Acute upper respiratory infection, unspecified: Secondary | ICD-10-CM | POA: Diagnosis not present

## 2015-09-16 LAB — POCT URINALYSIS DIP (DEVICE)
Bilirubin Urine: NEGATIVE
GLUCOSE, UA: NEGATIVE mg/dL
Hgb urine dipstick: NEGATIVE
Ketones, ur: NEGATIVE mg/dL
Leukocytes, UA: NEGATIVE
NITRITE: NEGATIVE
PROTEIN: NEGATIVE mg/dL
Specific Gravity, Urine: 1.025 (ref 1.005–1.030)
Urobilinogen, UA: 0.2 mg/dL (ref 0.0–1.0)
pH: 7 (ref 5.0–8.0)

## 2015-09-16 MED ORDER — PRENATAL VITAMINS 0.8 MG PO TABS: 1.0000 | ORAL_TABLET | Freq: Every day | ORAL | Status: DC

## 2015-09-16 NOTE — Progress Notes (Signed)
[employer] [phone].

## 2015-09-16 NOTE — Progress Notes (Signed)
[employer] [phone].   Subjective:  Sandra Sexton is a 20 y.o. G2P1001 at [redacted]w[redacted]d being seen today for ongoing prenatal care.  She is currently monitored for the following issues for this low-risk pregnancy and has Supervision of normal pregnancy and Language barrier affecting health care on her problem list.  Patient reports nasal congestion.  Contractions: Not present. Vag. Bleeding: None.  Movement: Present. Denies leaking of fluid.   The following portions of the patient's history were reviewed and updated as appropriate: allergies, current medications, past family history, past medical history, past social history, past surgical history and problem list. Problem list updated.  Objective:   Filed Vitals:   09/16/15 1007  BP: 97/57  Pulse: 85  Temp: 98.6 F (37 C)  Weight: 151 lb 14.4 oz (68.901 kg)    Fetal Status: Fetal Heart Rate (bpm): 152 Fundal Height: 31 cm Movement: Present     General:  Alert, oriented and cooperative. Patient is in no acute distress.  Skin: Skin is warm and dry. No rash noted.   Cardiovascular: Normal heart rate noted  Respiratory: Normal respiratory effort, no problems with respiration noted  Abdomen: Soft, gravid, appropriate for gestational age. Pain/Pressure: Present     Pelvic: Vag. Bleeding: None     Cervical exam deferred        Extremities: Normal range of motion.  Edema: None  Mental Status: Normal mood and affect. Normal behavior. Normal judgment and thought content.   Urinalysis: Urine Protein: Negative Urine Glucose: Negative  Assessment and Plan:  Pregnancy: G2P1001 at [redacted]w[redacted]d  1. Supervision of normal pregnancy, unspecified trimester   2. Upper respiratory infection, viral --Recommend increase PO fluids, use saline nasal spray, list of safe meds in pregnancy given.  Preterm labor symptoms and general obstetric precautions including but not limited to vaginal bleeding, contractions, leaking of fluid and fetal movement  were reviewed in detail with the patient. Please refer to After Visit Summary for other counseling recommendations.  Return in about 2 weeks (around 09/30/2015).   Hurshel Party, CNM

## 2015-10-01 ENCOUNTER — Ambulatory Visit (INDEPENDENT_AMBULATORY_CARE_PROVIDER_SITE_OTHER): Payer: Medicaid Other | Admitting: Certified Nurse Midwife

## 2015-10-01 VITALS — BP 110/64 | HR 82 | Temp 98.2°F | Wt 155.0 lb

## 2015-10-01 DIAGNOSIS — Z789 Other specified health status: Secondary | ICD-10-CM | POA: Diagnosis not present

## 2015-10-01 DIAGNOSIS — Z3493 Encounter for supervision of normal pregnancy, unspecified, third trimester: Secondary | ICD-10-CM | POA: Diagnosis present

## 2015-10-01 LAB — POCT URINALYSIS DIP (DEVICE)
Bilirubin Urine: NEGATIVE
Glucose, UA: NEGATIVE mg/dL
Hgb urine dipstick: NEGATIVE
Ketones, ur: NEGATIVE mg/dL
Nitrite: NEGATIVE
Protein, ur: NEGATIVE mg/dL
Specific Gravity, Urine: 1.015 (ref 1.005–1.030)
Urobilinogen, UA: 0.2 mg/dL (ref 0.0–1.0)
pH: 7 (ref 5.0–8.0)

## 2015-10-01 NOTE — Progress Notes (Signed)
Breastfeeding tip of the week reviewed Patient c/o frequent nosebleeds [place] interpreter via language line

## 2015-10-01 NOTE — Patient Instructions (Signed)

## 2015-10-01 NOTE — Progress Notes (Signed)
Subjective:  Sandra Sexton is a 20 y.o. G2P1001 at [redacted]w[redacted]d being seen today for ongoing prenatal care.  She is currently monitored for the following issues for this low-risk pregnancy and has Supervision of normal pregnancy and Language barrier affecting health care on her problem list.  Patient reports no complaints.  Contractions: Not present. Vag. Bleeding: None.  Movement: Present. Denies leaking of fluid.   The following portions of the patient's history were reviewed and updated as appropriate: allergies, current medications, past family history, past medical history, past social history, past surgical history and problem list. Problem list updated.  Objective:   Filed Vitals:   10/01/15 1017  BP: 110/64  Pulse: 82  Temp: 98.2 F (36.8 C)  Weight: 155 lb (70.308 kg)    Fetal Status: Fetal Heart Rate (bpm): 145   Movement: Present     General:  Alert, oriented and cooperative. Patient is in no acute distress.  Skin: Skin is warm and dry. No rash noted.   Cardiovascular: Normal heart rate noted  Respiratory: Normal respiratory effort, no problems with respiration noted  Abdomen: Soft, gravid, appropriate for gestational age. Pain/Pressure: Present     Pelvic: Vag. Bleeding: None     Cervical exam deferred        Extremities: Normal range of motion.  Edema: Trace  Mental Status: Normal mood and affect. Normal behavior. Normal judgment and thought content.   Urinalysis: Urine Protein: Negative Urine Glucose: Negative  Assessment and Plan:  Pregnancy: G2P1001 at [redacted]w[redacted]d  1. Language barrier affecting health care [facility] office info given   2. Supervision of normal pregnancy, third trimester  Preterm labor symptoms and general obstetric precautions including but not limited to vaginal bleeding, contractions, leaking of fluid and fetal movement were reviewed in detail with the patient. Please refer to After Visit Summary for other counseling recommendations.  No Follow-up on  file.   Rhea Pink, CNM

## 2015-10-15 ENCOUNTER — Other Ambulatory Visit (HOSPITAL_COMMUNITY)
Admission: RE | Admit: 2015-10-15 | Discharge: 2015-10-15 | Disposition: A | Discharge status: Home / Self Care | Payer: Medicaid Other | Source: Ambulatory Visit | Attending: Advanced Practice Midwife | Admitting: Advanced Practice Midwife

## 2015-10-15 ENCOUNTER — Ambulatory Visit (INDEPENDENT_AMBULATORY_CARE_PROVIDER_SITE_OTHER): Payer: Medicaid Other | Admitting: Advanced Practice Midwife

## 2015-10-15 VITALS — BP 95/66 | HR 81 | Temp 98.4°F | Wt 158.2 lb

## 2015-10-15 DIAGNOSIS — Z113 Encounter for screening for infections with a predominantly sexual mode of transmission: Secondary | ICD-10-CM | POA: Diagnosis present

## 2015-10-15 DIAGNOSIS — Z3493 Encounter for supervision of normal pregnancy, unspecified, third trimester: Secondary | ICD-10-CM

## 2015-10-15 LAB — POCT URINALYSIS DIP (DEVICE)
BILIRUBIN URINE: NEGATIVE
Glucose, UA: NEGATIVE mg/dL
HGB URINE DIPSTICK: NEGATIVE
KETONES UR: NEGATIVE mg/dL
Leukocytes, UA: NEGATIVE
Nitrite: NEGATIVE
PH: 7 (ref 5.0–8.0)
PROTEIN: NEGATIVE mg/dL
SPECIFIC GRAVITY, URINE: 1.02 (ref 1.005–1.030)
Urobilinogen, UA: 0.2 mg/dL (ref 0.0–1.0)

## 2015-10-15 LAB — OB RESULTS CONSOLE GC/CHLAMYDIA: Gonorrhea: NEGATIVE

## 2015-10-15 LAB — OB RESULTS CONSOLE GBS: GBS: NEGATIVE

## 2015-10-15 NOTE — Progress Notes (Signed)
Breastfeeding tip of the week reviewed Cultures today [place] interpreter [phone] used

## 2015-10-15 NOTE — Patient Instructions (Signed)
Braxton Hicks Contractions °Contractions of the uterus can occur throughout pregnancy. Contractions are not always a sign that you are in labor.  °WHAT ARE BRAXTON HICKS CONTRACTIONS?  °Contractions that occur before labor are called Braxton Hicks contractions, or false labor. Toward the end of pregnancy (32-34 weeks), these contractions can develop more often and may become more forceful. This is not true labor because these contractions do not result in opening (dilatation) and thinning of the cervix. They are sometimes difficult to tell apart from true labor because these contractions can be forceful and people have different pain tolerances. You should not feel embarrassed if you go to the hospital with false labor. Sometimes, the only way to tell if you are in true labor is for your health care provider to look for changes in the cervix. °If there are no prenatal problems or other health problems associated with the pregnancy, it is completely safe to be sent home with false labor and await the onset of true labor. °HOW CAN YOU TELL THE DIFFERENCE BETWEEN TRUE AND FALSE LABOR? °False Labor °· The contractions of false labor are usually shorter and not as hard as those of true labor.   °· The contractions are usually irregular.   °· The contractions are often felt in the front of the lower abdomen and in the groin.   °· The contractions may go away when you walk around or change positions while lying down.   °· The contractions get weaker and are shorter lasting as time goes on.   °· The contractions do not usually become progressively stronger, regular, and closer together as with true labor.   °True Labor °· Contractions in true labor last 30-70 seconds, become very regular, usually become more intense, and increase in frequency.   °· The contractions do not go away with walking.   °· The discomfort is usually felt in the top of the uterus and spreads to the lower abdomen and low back.   °· True labor can be  determined by your health care provider with an exam. This will show that the cervix is dilating and getting thinner.   °WHAT TO REMEMBER °· Keep up with your usual exercises and follow other instructions given by your health care provider.   °· Take medicines as directed by your health care provider.   °· Keep your regular prenatal appointments.   °· Eat and drink lightly if you think you are going into labor.   °· If Braxton Hicks contractions are making you uncomfortable:   °¨ Change your position from lying down or resting to walking, or from walking to resting.   °¨ Sit and rest in a tub of warm water.   °¨ Drink 2-3 glasses of water. Dehydration may cause these contractions.   °¨ Do slow and deep breathing several times an hour.   °WHEN SHOULD I SEEK IMMEDIATE MEDICAL CARE? °Seek immediate medical care if: °· Your contractions become stronger, more regular, and closer together.   °· You have fluid leaking or gushing from your vagina.   °· You have a fever.   °· You pass blood-tinged mucus.   °· You have vaginal bleeding.   °· You have continuous abdominal pain.   °· You have low back pain that you never had before.   °· You feel your baby's head pushing down and causing pelvic pressure.   °· Your baby is not moving as much as it used to.   °  °This information is not intended to replace advice given to you by your health care provider. Make sure you discuss any questions you have with your health care   provider. °  °Document Released: 07/19/2005 Document Revised: 07/24/2013 Document Reviewed: 04/30/2013 °Elsevier Interactive Patient Education ©2016 [employer] ° °

## 2015-10-15 NOTE — Progress Notes (Signed)
Subjective:  Bosie HelperChantal Holway is a 20 y.o. G2P1001 at 3667w6d being seen today for ongoing prenatal care.  She is currently monitored for the following issues for this low-risk pregnancy and has Supervision of normal pregnancy and Language barrier affecting health care on her problem list.  Patient reports no complaints.  Contractions: Not present. Vag. Bleeding: None.  Movement: Present. Denies leaking of fluid.   The following portions of the patient's history were reviewed and updated as appropriate: allergies, current medications, past family history, past medical history, past social history, past surgical history and problem list. Problem list updated.  Objective:   Filed Vitals:   10/15/15 1120  BP: 95/66  Pulse: 81  Temp: 98.4 F (36.9 C)  Weight: 158 lb 3.2 oz (71.759 kg)    Fetal Status: Fetal Heart Rate (bpm): 155 Fundal Height: 35 cm Movement: Present  Presentation: Vertex  General:  Alert, oriented and cooperative. Patient is in no acute distress.  Skin: Skin is warm and dry. No rash noted.   Cardiovascular: Normal heart rate noted  Respiratory: Normal respiratory effort, no problems with respiration noted  Abdomen: Soft, gravid, appropriate for gestational age. Pain/Pressure: Present     Pelvic: Vag. Bleeding: None Vag D/C Character: White   Cervical exam performed Dilation: Closed Effacement (%): 0 Station: -2  Extremities: Normal range of motion.  Edema: Trace  Mental Status: Normal mood and affect. Normal behavior. Normal judgment and thought content.   Urinalysis: Urine Protein: Negative Urine Glucose: Negative  Assessment and Plan:  Pregnancy: G2P1001 at 4867w6d  1. Supervision of normal pregnancy, third trimester - GBS -GC/Chlamydia  Preterm labor symptoms and general obstetric precautions including but not limited to vaginal bleeding, contractions, leaking of fluid and fetal movement were reviewed in detail with the patient. Please refer to After Visit  Summary for other counseling recommendations.  Return in about 1 week (around 10/22/2015).   Dorathy KinsmanVirginia Ikesha Siller, CNM

## 2015-10-16 LAB — GC/CHLAMYDIA PROBE AMP (~~LOC~~) NOT AT ARMC
CHLAMYDIA, DNA PROBE: NEGATIVE
Neisseria Gonorrhea: NEGATIVE

## 2015-10-17 LAB — CULTURE, BETA STREP (GROUP B ONLY)

## 2015-10-22 ENCOUNTER — Ambulatory Visit (INDEPENDENT_AMBULATORY_CARE_PROVIDER_SITE_OTHER): Payer: Medicaid Other | Admitting: Family Medicine

## 2015-10-22 VITALS — BP 114/66 | HR 68 | Temp 98.6°F | Wt 159.9 lb

## 2015-10-22 DIAGNOSIS — O26893 Other specified pregnancy related conditions, third trimester: Secondary | ICD-10-CM

## 2015-10-22 DIAGNOSIS — N898 Other specified noninflammatory disorders of vagina: Secondary | ICD-10-CM

## 2015-10-22 DIAGNOSIS — Z789 Other specified health status: Secondary | ICD-10-CM

## 2015-10-22 DIAGNOSIS — Z3493 Encounter for supervision of normal pregnancy, unspecified, third trimester: Secondary | ICD-10-CM

## 2015-10-22 LAB — POCT URINALYSIS DIP (DEVICE)
Bilirubin Urine: NEGATIVE
Glucose, UA: NEGATIVE mg/dL
Hgb urine dipstick: NEGATIVE
Ketones, ur: NEGATIVE mg/dL
Leukocytes, UA: NEGATIVE
NITRITE: NEGATIVE
PH: 6.5 (ref 5.0–8.0)
PROTEIN: NEGATIVE mg/dL
Specific Gravity, Urine: 1.01 (ref 1.005–1.030)
Urobilinogen, UA: 0.2 mg/dL (ref 0.0–1.0)

## 2015-10-22 NOTE — Progress Notes (Signed)
Breastfeeding tip of the week reviewed Pt c/o watery leakage x2 days which makes her have to change her underwear frequently Also has vaginal pain with urination [employer] interpreter [phone] used

## 2015-10-22 NOTE — Patient Instructions (Signed)
Come to the MAU (maternity admission unit) for °1) Strong contractions every 2-3 minutes for at least 1 hour that do no go away when you drink water or take a warm shower. These contractions will be so strong all you can do is breath through them °2) Vaginal bleeding- anything more than spotting °3) Loss of fluid like you broke your water °4) Decreased movement of your baby ° °

## 2015-10-22 NOTE — Progress Notes (Signed)
Subjective:  Sandra Sexton is a 20 y.o. G2P1001 at [redacted]w[redacted]d being seen today for ongoing prenatal care.  She is currently monitored for the following issues for this low-risk pregnancy and has Supervision of normal pregnancy and Language barrier affecting health care on her problem list.  Patient reports leaking.  Contractions: Not present. Vag. Bleeding: None.  Movement: Present.   Vaginal Fluid: Reports leaking fluid for 2 days. Reports she is changing her underwear several times per days. Denies pain in abdomen. Reports heaviness in vagina. Increased fluid with coughing and sneezing. First time was only a little bit.  The following portions of the patient's history were reviewed and updated as appropriate: allergies, current medications, past family history, past medical history, past social history, past surgical history and problem list. Problem list updated.  Objective:   Filed Vitals:   10/22/15 1102  BP: 114/66  Pulse: 68  Temp: 98.6 F (37 C)  Weight: 159 lb 14.4 oz (72.53 kg)    Fetal Status: Fetal Heart Rate (bpm): 130   Movement: Present     General:  Alert, oriented and cooperative. Patient is in no acute distress.  Skin: Skin is warm and dry. No rash noted.   Cardiovascular: Normal heart rate noted  Respiratory: Normal respiratory effort, no problems with respiration noted  Abdomen: Soft, gravid, appropriate for gestational age. Pain/Pressure: Present     Pelvic: Vag. Bleeding: None Vag D/C Character: Watery   Cervical exam performed        Extremities: Normal range of motion.  Edema: Trace  Mental Status: Normal mood and affect. Normal behavior. Normal judgment and thought content.   Urinalysis: Urine Protein: Negative Urine Glucose: Negative  Assessment and Plan:  Pregnancy: G2P1001 at 814w6d  1. Language barrier affecting health care Used interpreter  2. Supervision of normal pregnancy, third trimester Updated box  3. LOF - No pooling, Fern neg - Wet prep  collected  Term labor symptoms and general obstetric precautions including but not limited to vaginal bleeding, contractions, leaking of fluid and fetal movement were reviewed in detail with the patient. Please refer to After Visit Summary for other counseling recommendations.  Return in about 1 week (around 10/29/2015) for Routine prenatal care.   Federico FlakeKimberly Niles Nydia Ytuarte, MD

## 2015-10-22 NOTE — Addendum Note (Signed)
Addended by: Garret ReddishBARNES, Jesenya Bowditch M on: 10/22/2015 11:51 AM   Modules accepted: Orders

## 2015-10-23 LAB — WET PREP, GENITAL
TRICH WET PREP: NONE SEEN
YEAST WET PREP: NONE SEEN

## 2015-10-28 ENCOUNTER — Telehealth: Payer: Self-pay

## 2015-10-28 NOTE — Telephone Encounter (Signed)
I called patient but there was no answer or voicemail to leave a message/dg  Call patient and let her know her discharge is likely from bacterial vaginosis. If her sx are still presenting please sent in metronidazole 500 BID X7.

## 2015-10-30 ENCOUNTER — Ambulatory Visit (INDEPENDENT_AMBULATORY_CARE_PROVIDER_SITE_OTHER): Payer: Medicaid Other | Admitting: Obstetrics and Gynecology

## 2015-10-30 VITALS — BP 114/66 | HR 77 | Wt 164.0 lb

## 2015-10-30 DIAGNOSIS — Z3490 Encounter for supervision of normal pregnancy, unspecified, unspecified trimester: Secondary | ICD-10-CM

## 2015-10-30 DIAGNOSIS — Z3493 Encounter for supervision of normal pregnancy, unspecified, third trimester: Secondary | ICD-10-CM

## 2015-10-30 LAB — POCT URINALYSIS DIP (DEVICE)
BILIRUBIN URINE: NEGATIVE
Glucose, UA: NEGATIVE mg/dL
HGB URINE DIPSTICK: NEGATIVE
KETONES UR: NEGATIVE mg/dL
NITRITE: NEGATIVE
PH: 7 (ref 5.0–8.0)
PROTEIN: NEGATIVE mg/dL
SPECIFIC GRAVITY, URINE: 1.02 (ref 1.005–1.030)
Urobilinogen, UA: 0.2 mg/dL (ref 0.0–1.0)

## 2015-10-30 NOTE — Progress Notes (Signed)
Used [employer]pre[employer] [phone]

## 2015-10-30 NOTE — Progress Notes (Signed)
Subjective:  Bosie HelperChantal Dinan is a 20 y.o. G2P1001 at 4947w0d being seen today for ongoing prenatal care.  She is currently monitored for the following issues for this low-risk pregnancy and has Supervision of normal pregnancy and Language barrier affecting health care on her problem list.  Patient reports no complaints.  Contractions: Not present. Vag. Bleeding: None.  Movement: Present. Denies leaking of fluid.   The following portions of the patient's history were reviewed and updated as appropriate: allergies, current medications, past family history, past medical history, past social history, past surgical history and problem list. Problem list updated.  Objective:   Filed Vitals:   10/30/15 1029  BP: 114/66  Pulse: 77  Weight: 164 lb (74.39 kg)    Fetal Status: Fetal Heart Rate (bpm): 140   Movement: Present     General:  Alert, oriented and cooperative. Patient is in no acute distress.  Skin: Skin is warm and dry. No rash noted.   Cardiovascular: Normal heart rate noted  Respiratory: Normal respiratory effort, no problems with respiration noted  Abdomen: Soft, gravid, appropriate for gestational age. Pain/Pressure: Absent     Pelvic: Vag. Bleeding: None     Cervical exam deferred        Extremities: Normal range of motion.  Edema: Trace  Mental Status: Normal mood and affect. Normal behavior. Normal judgment and thought content.   Urinalysis:      Assessment and Plan:  Pregnancy: G2P1001 at 7247w0d  # Pregnancy - gbs, g/c wnl - measures 36 cm, same as last week, will follow closely at next appt  Term labor symptoms and general obstetric precautions including but not limited to vaginal bleeding, contractions, leaking of fluid and fetal movement were reviewed in detail with the patient. Please refer to After Visit Summary for other counseling recommendations.  F/u one week  Kathrynn RunningNoah Bedford Damary Doland, MD

## 2015-11-04 NOTE — Telephone Encounter (Signed)
No answer or voicemail to leave a message. 

## 2015-11-06 ENCOUNTER — Ambulatory Visit (INDEPENDENT_AMBULATORY_CARE_PROVIDER_SITE_OTHER): Payer: Medicaid Other | Admitting: Obstetrics & Gynecology

## 2015-11-06 VITALS — BP 108/67 | HR 89 | Temp 99.2°F | Wt 160.7 lb

## 2015-11-06 DIAGNOSIS — Z3492 Encounter for supervision of normal pregnancy, unspecified, second trimester: Secondary | ICD-10-CM

## 2015-11-06 MED ORDER — ACETAMINOPHEN 500 MG PO TABS: 500.0000 mg | ORAL_TABLET | Freq: Four times a day (QID) | ORAL | Status: DC | PRN

## 2015-11-06 NOTE — Addendum Note (Signed)
Addended by: Jaynie CollinsANYANWU, Caileb Rhue A on: 11/06/2015 11:02 AM   Modules accepted: Orders

## 2015-11-06 NOTE — Patient Instructions (Signed)
Return to clinic for any scheduled appointments or obstetric concerns, or go to MAU for evaluation  

## 2015-11-06 NOTE — Progress Notes (Signed)
Subjective:  Sandra Sexton is a 20 y.o. G2P1001 at 7458w0d being seen today for ongoing prenatal care.  She is currently monitored for the following issues for this low-risk pregnancy and has Supervision of normal pregnancy and Language barrier affecting health care on her problem list.  [employer] interpreter used.  Patient reports no complaints.  Contractions: Not present. Vag. Bleeding: None.  Movement: Present. Denies leaking of fluid.   The following portions of the patient's history were reviewed and updated as appropriate: allergies, current medications, past family history, past medical history, past social history, past surgical history and problem list. Problem list updated.  Objective:   Filed Vitals:   11/06/15 1043  BP: 108/67  Pulse: 89  Temp: 99.2 F (37.3 C)  Weight: 160 lb 11.2 oz (72.893 kg)    Fetal Status: Fetal Heart Rate (bpm): 152 Fundal Height: 37 cm Movement: Present     General:  Alert, oriented and cooperative. Patient is in no acute distress.  Skin: Skin is warm and dry. No rash noted.   Cardiovascular: Normal heart rate noted  Respiratory: Normal respiratory effort, no problems with respiration noted  Abdomen: Soft, gravid, appropriate for gestational age. Pain/Pressure: Present     Pelvic: Vag. Bleeding: None    Cervical exam deferred        Extremities: Normal range of motion.  Edema: Trace  Mental Status: Normal mood and affect. Normal behavior. Normal judgment and thought content.   Urinalysis: Urine Protein: Negative Urine Glucose: Negative  Assessment and Plan:  Pregnancy: G2P1001 at 10158w0d  1. Supervision of normal pregnancy, second trimester Postdates testing to start next week if still pregnant. Term labor symptoms and general obstetric precautions including but not limited to vaginal bleeding, contractions, leaking of fluid and fetal movement were reviewed in detail with the patient. Please refer to After Visit Summary for other counseling  recommendations.  Return in about 1 week (around 11/13/2015) for Postdates NST, OB Visit.   Tereso NewcomerUgonna A Allanah Mcfarland, MD

## 2015-11-06 NOTE — Progress Notes (Signed)
[employer]preter # [phone]  Edema- feet

## 2015-11-11 NOTE — Telephone Encounter (Signed)
Pt has appt on 11/13/2015. Please follow up with her regarding discharge.

## 2015-11-13 ENCOUNTER — Ambulatory Visit (INDEPENDENT_AMBULATORY_CARE_PROVIDER_SITE_OTHER): Payer: Medicaid Other | Admitting: Family Medicine

## 2015-11-13 VITALS — BP 114/72 | HR 81 | Wt 163.0 lb

## 2015-11-13 DIAGNOSIS — Z3492 Encounter for supervision of normal pregnancy, unspecified, second trimester: Secondary | ICD-10-CM

## 2015-11-13 DIAGNOSIS — Z3493 Encounter for supervision of normal pregnancy, unspecified, third trimester: Secondary | ICD-10-CM

## 2015-11-13 LAB — POCT URINALYSIS DIP (DEVICE)
BILIRUBIN URINE: NEGATIVE
Glucose, UA: NEGATIVE mg/dL
HGB URINE DIPSTICK: NEGATIVE
KETONES UR: NEGATIVE mg/dL
LEUKOCYTES UA: NEGATIVE
NITRITE: NEGATIVE
PH: 7.5 (ref 5.0–8.0)
Protein, ur: NEGATIVE mg/dL
Specific Gravity, Urine: 1.02 (ref 1.005–1.030)
Urobilinogen, UA: 1 mg/dL (ref 0.0–1.0)

## 2015-11-13 NOTE — Progress Notes (Signed)
Subjective:  Sandra Sexton is a 20 y.o. G2P1001 at 4071w0d being seen today for ongoing prenatal care.  She is currently monitored for the following issues for this low-risk pregnancy and has Supervision of normal pregnancy and Language barrier affecting health care on her problem list.  Patient reports no complaints.  Contractions: Not present. Vag. Bleeding: None.  Movement: Present. Denies leaking of fluid.   The following portions of the patient's history were reviewed and updated as appropriate: allergies, current medications, past family history, past medical history, past social history, past surgical history and problem list. Problem list updated.  Objective:   Filed Vitals:   11/13/15 1028  BP: 114/72  Pulse: 81  Weight: 163 lb (73.936 kg)    Fetal Status: Fetal Heart Rate (bpm): 136 Fundal Height: 39 cm Movement: Present  Presentation: Vertex  General:  Alert, oriented and cooperative. Patient is in no acute distress.  Skin: Skin is warm and dry. No rash noted.   Cardiovascular: Normal heart rate noted  Respiratory: Normal respiratory effort, no problems with respiration noted  Abdomen: Soft, gravid, appropriate for gestational age. Pain/Pressure: Present     Pelvic: Vag. Bleeding: None     Cervical exam deferred        Extremities: Normal range of motion.  Edema: Trace  Mental Status: Normal mood and affect. Normal behavior. Normal judgment and thought content.   Urinalysis: Urine Protein: Negative Urine Glucose: Negative  Assessment and Plan:  Pregnancy: G2P1001 at 6671w0d  1. Supervision of normal pregnancy, second trimester Continue routine prenatal care. Begin 2/wk testing for post dates, Monday IOL at 41 1/2 wks.  Term labor symptoms and general obstetric precautions including but not limited to vaginal bleeding, contractions, leaking of fluid and fetal movement were reviewed in detail with the patient. Please refer to After Visit Summary for other counseling  recommendations.  Return in 1 week (on 11/20/2015) for OB visit and NST, NST only in 4 days.   Reva Boresanya S Marjory Meints, MD

## 2015-11-13 NOTE — Patient Instructions (Signed)
Breastfeeding Deciding to breastfeed is one of the best choices you can make for you and your baby. A change in hormones during pregnancy causes your breast tissue to grow and increases the number and size of your milk ducts. These hormones also allow proteins, sugars, and fats from your blood supply to make breast milk in your milk-producing glands. Hormones prevent breast milk from being released before your baby is born as well as prompt milk flow after birth. Once breastfeeding has begun, thoughts of your baby, as well as his or her sucking or crying, can stimulate the release of milk from your milk-producing glands.  BENEFITS OF BREASTFEEDING For Your Baby  Your first milk (colostrum) helps your baby's digestive system function better.  There are antibodies in your milk that help your baby fight off infections.  Your baby has a lower incidence of asthma, allergies, and sudden infant death syndrome.  The nutrients in breast milk are better for your baby than infant formulas and are designed uniquely for your baby's needs.  Breast milk improves your baby's brain development.  Your baby is less likely to develop other conditions, such as childhood obesity, asthma, or type 2 diabetes mellitus. For You  Breastfeeding helps to create a very special bond between you and your baby.  Breastfeeding is convenient. Breast milk is always available at the correct temperature and costs nothing.  Breastfeeding helps to burn calories and helps you lose the weight gained during pregnancy.  Breastfeeding makes your uterus contract to its prepregnancy size faster and slows bleeding (lochia) after you give birth.   Breastfeeding helps to lower your risk of developing type 2 diabetes mellitus, osteoporosis, and breast or ovarian cancer later in life. SIGNS THAT YOUR BABY IS HUNGRY Early Signs of Hunger  Increased alertness or activity.  Stretching.  Movement of the head from side to  side.  Movement of the head and opening of the mouth when the corner of the mouth or cheek is stroked (rooting).  Increased sucking sounds, smacking lips, cooing, sighing, or squeaking.  Hand-to-mouth movements.  Increased sucking of fingers or hands. Late Signs of Hunger  Fussing.  Intermittent crying. Extreme Signs of Hunger Signs of extreme hunger will require calming and consoling before your baby will be able to breastfeed successfully. Do not wait for the following signs of extreme hunger to occur before you initiate breastfeeding:  Restlessness.  A loud, strong cry.  Screaming. BREASTFEEDING BASICS Breastfeeding Initiation  Find a comfortable place to sit or lie down, with your neck and back well supported.  Place a pillow or rolled up blanket under your baby to bring him or her to the level of your breast (if you are seated). Nursing pillows are specially designed to help support your arms and your baby while you breastfeed.  Make sure that your baby's abdomen is facing your abdomen.  Gently massage your breast. With your fingertips, massage from your chest wall toward your nipple in a circular motion. This encourages milk flow. You may need to continue this action during the feeding if your milk flows slowly.  Support your breast with 4 fingers underneath and your thumb above your nipple. Make sure your fingers are well away from your nipple and your baby's mouth.  Stroke your baby's lips gently with your finger or nipple.  When your baby's mouth is open wide enough, quickly bring your baby to your breast, placing your entire nipple and as much of the colored area around your nipple (  areola) as possible into your baby's mouth.  More areola should be visible above your baby's upper lip than below the lower lip.  Your baby's tongue should be between his or her lower gum and your breast.  Ensure that your baby's mouth is correctly positioned around your nipple  (latched). Your baby's lips should create a seal on your breast and be turned out (everted).  It is common for your baby to suck about 2-3 minutes in order to start the flow of breast milk. Latching Teaching your baby how to latch on to your breast properly is very important. An improper latch can cause nipple pain and decreased milk supply for you and poor weight gain in your baby. Also, if your baby is not latched onto your nipple properly, he or she may swallow some air during feeding. This can make your baby fussy. Burping your baby when you switch breasts during the feeding can help to get rid of the air. However, teaching your baby to latch on properly is still the best way to prevent fussiness from swallowing air while breastfeeding. Signs that your baby has successfully latched on to your nipple:  Silent tugging or silent sucking, without causing you pain.  Swallowing heard between every 3-4 sucks.  Muscle movement above and in front of his or her ears while sucking. Signs that your baby has not successfully latched on to nipple:  Sucking sounds or smacking sounds from your baby while breastfeeding.  Nipple pain. If you think your baby has not latched on correctly, slip your finger into the corner of your baby's mouth to break the suction and place it between your baby's gums. Attempt breastfeeding initiation again. Signs of Successful Breastfeeding Signs from your baby:  A gradual decrease in the number of sucks or complete cessation of sucking.  Falling asleep.  Relaxation of his or her body.  Retention of a small amount of milk in his or her mouth.  Letting go of your breast by himself or herself. Signs from you:  Breasts that have increased in firmness, weight, and size 1-3 hours after feeding.  Breasts that are softer immediately after breastfeeding.  Increased milk volume, as well as a change in milk consistency and color by the fifth day of breastfeeding.  Nipples  that are not sore, cracked, or bleeding. Signs That Your Baby is Getting Enough Milk  Wetting at least 3 diapers in a 24-hour period. The urine should be clear and pale yellow by age 5 days.  At least 3 stools in a 24-hour period by age 5 days. The stool should be soft and yellow.  At least 3 stools in a 24-hour period by age 7 days. The stool should be seedy and yellow.  No loss of weight greater than 10% of birth weight during the first 3 days of age.  Average weight gain of 4-7 ounces (113-198 g) per week after age 4 days.  Consistent daily weight gain by age 5 days, without weight loss after the age of 2 weeks. After a feeding, your baby may spit up a small amount. This is common. BREASTFEEDING FREQUENCY AND DURATION Frequent feeding will help you make more milk and can prevent sore nipples and breast engorgement. Breastfeed when you feel the need to reduce the fullness of your breasts or when your baby shows signs of hunger. This is called "breastfeeding on demand." Avoid introducing a pacifier to your baby while you are working to establish breastfeeding (the first 4-6 weeks   after your baby is born). After this time you may choose to use a pacifier. Research has shown that pacifier use during the first year of a baby's life decreases the risk of sudden infant death syndrome (SIDS). Allow your baby to feed on each breast as long as he or she wants. Breastfeed until your baby is finished feeding. When your baby unlatches or falls asleep while feeding from the first breast, offer the second breast. Because newborns are often sleepy in the first few weeks of life, you may need to awaken your baby to get him or her to feed. Breastfeeding times will vary from baby to baby. However, the following rules can serve as a guide to help you ensure that your baby is properly fed:  Newborns (babies 4 weeks of age or younger) may breastfeed every 1-3 hours.  Newborns should not go longer than 3 hours  during the day or 5 hours during the night without breastfeeding.  You should breastfeed your baby a minimum of 8 times in a 24-hour period until you begin to introduce solid foods to your baby at around 6 months of age. BREAST MILK PUMPING Pumping and storing breast milk allows you to ensure that your baby is exclusively fed your breast milk, even at times when you are unable to breastfeed. This is especially important if you are going back to work while you are still breastfeeding or when you are not able to be present during feedings. Your lactation consultant can give you guidelines on how long it is safe to store breast milk. A breast pump is a machine that allows you to pump milk from your breast into a sterile bottle. The pumped breast milk can then be stored in a refrigerator or freezer. Some breast pumps are operated by hand, while others use electricity. Ask your lactation consultant which type will work best for you. Breast pumps can be purchased, but some hospitals and breastfeeding support groups lease breast pumps on a monthly basis. A lactation consultant can teach you how to hand express breast milk, if you prefer not to use a pump. CARING FOR YOUR BREASTS WHILE YOU BREASTFEED Nipples can become dry, cracked, and sore while breastfeeding. The following recommendations can help keep your breasts moisturized and healthy:  Avoid using soap on your nipples.  Wear a supportive bra. Although not required, special nursing bras and tank tops are designed to allow access to your breasts for breastfeeding without taking off your entire bra or top. Avoid wearing underwire-style bras or extremely tight bras.  Air dry your nipples for 3-4minutes after each feeding.  Use only cotton bra pads to absorb leaked breast milk. Leaking of breast milk between feedings is normal.  Use lanolin on your nipples after breastfeeding. Lanolin helps to maintain your skin's normal moisture barrier. If you use  pure lanolin, you do not need to wash it off before feeding your baby again. Pure lanolin is not toxic to your baby. You may also hand express a few drops of breast milk and gently massage that milk into your nipples and allow the milk to air dry. In the first few weeks after giving birth, some women experience extremely full breasts (engorgement). Engorgement can make your breasts feel heavy, warm, and tender to the touch. Engorgement peaks within 3-5 days after you give birth. The following recommendations can help ease engorgement:  Completely empty your breasts while breastfeeding or pumping. You may want to start by applying warm, moist heat (in   the shower or with warm water-soaked hand towels) just before feeding or pumping. This increases circulation and helps the milk flow. If your baby does not completely empty your breasts while breastfeeding, pump any extra milk after he or she is finished.  Wear a snug bra (nursing or regular) or tank top for 1-2 days to signal your body to slightly decrease milk production.  Apply ice packs to your breasts, unless this is too uncomfortable for you.  Make sure that your baby is latched on and positioned properly while breastfeeding. If engorgement persists after 48 hours of following these recommendations, contact your health care provider or a lactation consultant. OVERALL HEALTH CARE RECOMMENDATIONS WHILE BREASTFEEDING  Eat healthy foods. Alternate between meals and snacks, eating 3 of each per day. Because what you eat affects your breast milk, some of the foods may make your baby more irritable than usual. Avoid eating these foods if you are sure that they are negatively affecting your baby.  Drink milk, fruit juice, and water to satisfy your thirst (about 10 glasses a day).  Rest often, relax, and continue to take your prenatal vitamins to prevent fatigue, stress, and anemia.  Continue breast self-awareness checks.  Avoid chewing and smoking  tobacco. Chemicals from cigarettes that pass into breast milk and exposure to secondhand smoke may harm your baby.  Avoid alcohol and drug use, including marijuana. Some medicines that may be harmful to your baby can pass through breast milk. It is important to ask your health care provider before taking any medicine, including all over-the-counter and prescription medicine as well as vitamin and herbal supplements. It is possible to become pregnant while breastfeeding. If birth control is desired, ask your health care provider about options that will be safe for your baby. SEEK MEDICAL CARE IF:  You feel like you want to stop breastfeeding or have become frustrated with breastfeeding.  You have painful breasts or nipples.  Your nipples are cracked or bleeding.  Your breasts are red, tender, or warm.  You have a swollen area on either breast.  You have a fever or chills.  You have nausea or vomiting.  You have drainage other than breast milk from your nipples.  Your breasts do not become full before feedings by the fifth day after you give birth.  You feel sad and depressed.  Your baby is too sleepy to eat well.  Your baby is having trouble sleeping.   Your baby is wetting less than 3 diapers in a 24-hour period.  Your baby has less than 3 stools in a 24-hour period.  Your baby's skin or the white part of his or her eyes becomes yellow.   Your baby is not gaining weight by 5 days of age. SEEK IMMEDIATE MEDICAL CARE IF:  Your baby is overly tired (lethargic) and does not want to wake up and feed.  Your baby develops an unexplained fever.   This information is not intended to replace advice given to you by your health care provider. Make sure you discuss any questions you have with your health care provider.   Document Released: 07/19/2005 Document Revised: 04/09/2015 Document Reviewed: 01/10/2013 Elsevier Interactive Patient Education 2016 Elsevier Inc.  

## 2015-11-13 NOTE — Progress Notes (Signed)
[place] interpeter [phone]

## 2015-11-15 ENCOUNTER — Inpatient Hospital Stay (HOSPITAL_COMMUNITY)
Admission: AD | Admit: 2015-11-15 | Discharge: 2015-11-16 | DRG: 775 | Disposition: A | Discharge status: Home / Self Care | Payer: Medicaid Other | Source: Ambulatory Visit | Attending: Family Medicine | Admitting: Family Medicine

## 2015-11-15 ENCOUNTER — Encounter (HOSPITAL_COMMUNITY): Payer: Self-pay | Admitting: *Deleted

## 2015-11-15 DIAGNOSIS — Z3A40 40 weeks gestation of pregnancy: Secondary | ICD-10-CM | POA: Diagnosis not present

## 2015-11-15 DIAGNOSIS — Z3492 Encounter for supervision of normal pregnancy, unspecified, second trimester: Secondary | ICD-10-CM

## 2015-11-15 DIAGNOSIS — IMO0001 Reserved for inherently not codable concepts without codable children: Secondary | ICD-10-CM

## 2015-11-15 DIAGNOSIS — Z880 Allergy status to penicillin: Secondary | ICD-10-CM

## 2015-11-15 LAB — CBC
HEMATOCRIT: 33.3 % — AB (ref 36.0–46.0)
HEMOGLOBIN: 10.8 g/dL — AB (ref 12.0–15.0)
MCH: 28.6 pg (ref 26.0–34.0)
MCHC: 32.4 g/dL (ref 30.0–36.0)
MCV: 88.3 fL (ref 78.0–100.0)
Platelets: 160 10*3/uL (ref 150–400)
RBC: 3.77 MIL/uL — AB (ref 3.87–5.11)
RDW: 13.8 % (ref 11.5–15.5)
WBC: 6 10*3/uL (ref 4.0–10.5)

## 2015-11-15 LAB — ABO/RH: ABO/RH(D): O POS

## 2015-11-15 LAB — TYPE AND SCREEN
ABO/RH(D): O POS
Antibody Screen: NEGATIVE

## 2015-11-15 LAB — RPR: RPR: NONREACTIVE

## 2015-11-15 MED ORDER — MEASLES, MUMPS & RUBELLA VAC ~~LOC~~ INJ
0.5000 mL | INJECTION | Freq: Once | SUBCUTANEOUS | Status: DC
  Filled 2015-11-15: qty 0.5

## 2015-11-15 MED ORDER — TERBUTALINE SULFATE 1 MG/ML IJ SOLN
0.2500 mg | Freq: Once | INTRAMUSCULAR | Status: DC | PRN
Start: 2015-11-15 — End: 2015-11-15
  Filled 2015-11-15: qty 1

## 2015-11-15 MED ORDER — LACTATED RINGERS IV SOLN
INTRAVENOUS | Status: DC
  Administered 2015-11-15 (×2): via INTRAVENOUS

## 2015-11-15 MED ORDER — FENTANYL CITRATE (PF) 100 MCG/2ML IJ SOLN: 50.0000 ug | INTRAMUSCULAR | Status: DC | PRN

## 2015-11-15 MED ORDER — OXYTOCIN BOLUS FROM INFUSION: 500.0000 mL | INTRAVENOUS | Status: DC

## 2015-11-15 MED ORDER — ACETAMINOPHEN 325 MG PO TABS: 650.0000 mg | ORAL_TABLET | ORAL | Status: DC | PRN

## 2015-11-15 MED ORDER — DIBUCAINE 1 % RE OINT: 1.0000 "application " | TOPICAL_OINTMENT | RECTAL | Status: DC | PRN

## 2015-11-15 MED ORDER — BENZOCAINE-MENTHOL 20-0.5 % EX AERO: 1.0000 "application " | INHALATION_SPRAY | CUTANEOUS | Status: DC | PRN

## 2015-11-15 MED ORDER — IBUPROFEN 600 MG PO TABS
600.0000 mg | ORAL_TABLET | Freq: Four times a day (QID) | ORAL | Status: DC
  Administered 2015-11-16 (×3): 600 mg via ORAL
  Filled 2015-11-15 (×3): qty 1

## 2015-11-15 MED ORDER — LIDOCAINE HCL (PF) 1 % IJ SOLN
30.0000 mL | INTRAMUSCULAR | Status: DC | PRN
  Filled 2015-11-15: qty 30

## 2015-11-15 MED ORDER — MAGNESIUM HYDROXIDE 400 MG/5ML PO SUSP: 30.0000 mL | ORAL | Status: DC | PRN

## 2015-11-15 MED ORDER — SENNOSIDES-DOCUSATE SODIUM 8.6-50 MG PO TABS
2.0000 | ORAL_TABLET | ORAL | Status: DC
  Administered 2015-11-16: 2 via ORAL
  Filled 2015-11-15: qty 2

## 2015-11-15 MED ORDER — FLEET ENEMA 7-19 GM/118ML RE ENEM: 1.0000 | ENEMA | RECTAL | Status: DC | PRN

## 2015-11-15 MED ORDER — PRENATAL MULTIVITAMIN CH
1.0000 | ORAL_TABLET | Freq: Every day | ORAL | Status: DC
  Administered 2015-11-16: 1 via ORAL
  Filled 2015-11-15: qty 1

## 2015-11-15 MED ORDER — DIPHENHYDRAMINE HCL 25 MG PO CAPS: 25.0000 mg | ORAL_CAPSULE | Freq: Four times a day (QID) | ORAL | Status: DC | PRN

## 2015-11-15 MED ORDER — FERROUS SULFATE 325 (65 FE) MG PO TABS
325.0000 mg | ORAL_TABLET | Freq: Two times a day (BID) | ORAL | Status: DC
  Filled 2015-11-15: qty 1

## 2015-11-15 MED ORDER — ZOLPIDEM TARTRATE 5 MG PO TABS: 5.0000 mg | ORAL_TABLET | Freq: Every evening | ORAL | Status: DC | PRN

## 2015-11-15 MED ORDER — OXYTOCIN 10 UNIT/ML IJ SOLN: 2.5000 [IU]/h | INTRAMUSCULAR | Status: DC

## 2015-11-15 MED ORDER — COCONUT OIL OIL: 1.0000 "application " | TOPICAL_OIL | Status: DC | PRN

## 2015-11-15 MED ORDER — IBUPROFEN 600 MG PO TABS
600.0000 mg | ORAL_TABLET | Freq: Four times a day (QID) | ORAL | Status: DC | PRN
  Administered 2015-11-15: 600 mg via ORAL
  Filled 2015-11-15: qty 1

## 2015-11-15 MED ORDER — ACETAMINOPHEN 325 MG PO TABS
650.0000 mg | ORAL_TABLET | ORAL | Status: DC | PRN
  Administered 2015-11-15: 650 mg via ORAL
  Filled 2015-11-15: qty 2

## 2015-11-15 MED ORDER — WITCH HAZEL-GLYCERIN EX PADS: 1.0000 "application " | MEDICATED_PAD | CUTANEOUS | Status: DC | PRN

## 2015-11-15 MED ORDER — TETANUS-DIPHTH-ACELL PERTUSSIS 5-2.5-18.5 LF-MCG/0.5 IM SUSP: 0.5000 mL | Freq: Once | INTRAMUSCULAR | Status: DC

## 2015-11-15 MED ORDER — LACTATED RINGERS IV SOLN: 500.0000 mL | INTRAVENOUS | Status: DC | PRN

## 2015-11-15 MED ORDER — SIMETHICONE 80 MG PO CHEW: 80.0000 mg | CHEWABLE_TABLET | ORAL | Status: DC | PRN

## 2015-11-15 MED ORDER — OXYCODONE-ACETAMINOPHEN 5-325 MG PO TABS: 1.0000 | ORAL_TABLET | ORAL | Status: DC | PRN

## 2015-11-15 MED ORDER — ONDANSETRON HCL 4 MG PO TABS: 4.0000 mg | ORAL_TABLET | ORAL | Status: DC | PRN

## 2015-11-15 MED ORDER — CITRIC ACID-SODIUM CITRATE 334-500 MG/5ML PO SOLN: 30.0000 mL | ORAL | Status: DC | PRN

## 2015-11-15 MED ORDER — ONDANSETRON HCL 4 MG/2ML IJ SOLN: 4.0000 mg | INTRAMUSCULAR | Status: DC | PRN

## 2015-11-15 MED ORDER — ONDANSETRON HCL 4 MG/2ML IJ SOLN: 4.0000 mg | Freq: Four times a day (QID) | INTRAMUSCULAR | Status: DC | PRN

## 2015-11-15 MED ORDER — OXYCODONE-ACETAMINOPHEN 5-325 MG PO TABS: 2.0000 | ORAL_TABLET | ORAL | Status: DC | PRN

## 2015-11-15 MED ORDER — OXYTOCIN 10 UNIT/ML IJ SOLN
1.0000 m[IU]/min | INTRAVENOUS | Status: DC
  Administered 2015-11-15: 2 m[IU]/min via INTRAVENOUS
  Filled 2015-11-15: qty 4

## 2015-11-15 NOTE — Lactation Note (Addendum)
This note was copied from a baby's chart. Lactation Consultation Note Initial visit at 5 hours of age.  Mom requests [place] interpreter and Ipad online services use with ID # [id number].  Mom breastfed an older child and denies concerns.  Mom reports last feeding about 1 hours ago only for a few minutes.  Encouraged mom to stimulate baby during feedings to keep baby awake.  [facility] resources given and discussed.  Encouraged to feed with early cues on demand. Encouraged 8-12 feedings daily after 1st day of life.   Early newborn behavior discussed.  Hand expression demonstrated by mom with colostrum visible. Attempted to discuss feeding log, but mom didn't seem to understand well, encouraged mom to keep record of feedings and diaper changes.LC reported of off going RN who reports using a swahili interpreter earlier today and giving lengthy education on basics of breastfeeding.  Mom to call for assist as needed.    [facility], [facility] and reported this to RN.   Patient Name: Sandra Sexton LKGMW'NToday's Date: 11/15/2015 Reason for consult: Initial assessment   Maternal Data Has patient been taught Hand Expression?: Yes Does the patient have breastfeeding experience prior to this delivery?: Yes  Feeding Feeding Type: Breast Fed Length of feed: 0 min  LATCH Score/Interventions                Intervention(s): Breastfeeding basics reviewed     Lactation Tools Discussed/Used WIC Program: No   Consult Status Consult Status: Follow-up Date: 11/16/15 Follow-up type: In-patient    Sandra Sexton, Arvella MerlesJana Sexton 11/15/2015, 8:29 PM

## 2015-11-15 NOTE — Progress Notes (Signed)
Interpreter on line

## 2015-11-15 NOTE — MAU Note (Addendum)
Received pt from EMS.  Contractions since 10pm.  No bleeding. Mucus sticky clear not watery .  Baby moving well.

## 2015-11-15 NOTE — Progress Notes (Signed)
Dr Nadine CountsGottschalk notified of pt's admission and status. Aware of ctx pattern, sve, FHR. Will see pt

## 2015-11-15 NOTE — Progress Notes (Signed)
Patient ID: Sandra Sexton, female   DOB: 08/29/1995, 20 y.o.   MRN: [id number] Sandra Sexton is a 20 y.o. G2P1001 at 6757w2d.  Subjective: Very uncomfortable w/ UC's  Objective: BP 107/67 mmHg  Pulse 87  Temp(Src) 98.4 F (36.9 C) (Oral)  Resp 18  Ht 5\' 1"  (1.549 m)  Wt 163 lb (73.936 kg)  BMI 30.81 kg/m2  SpO2 99%  LMP 12/27/2014 (Approximate)   FHT:  FHR: 125 bpm, variability: mod,  accelerations:  15x15,  decelerations:  none UC:   Q 2-10 minutes, moderate  Dilation: 5.5 Effacement (%): 60 Station: -2 Presentation: Vertex Exam by:: Sandra Sexton cnm  Labs: Results for orders placed or performed during the hospital encounter of 11/15/15 (from the past 24 hour(s))  CBC     Status: Abnormal   Collection Time: 11/15/15  6:35 AM  Result Value Ref Range   WBC 6.0 4.0 - 10.5 K/uL   RBC 3.77 (L) 3.87 - 5.11 MIL/uL   Hemoglobin 10.8 (L) 12.0 - 15.0 g/dL   HCT 08.633.3 (L) 57.836.0 - 46.946.0 %   MCV 88.3 78.0 - 100.0 fL   MCH 28.6 26.0 - 34.0 pg   MCHC 32.4 30.0 - 36.0 g/dL   RDW 62.913.8 52.811.5 - 41.315.5 %   Platelets 160 150 - 400 K/uL  Type and screen [facility]     Status: None   Collection Time: 11/15/15  6:35 AM  Result Value Ref Range   ABO/RH(D) O POS    Antibody Screen NEG    Sample Expiration 11/18/2015     Assessment / Plan: 9557w2d week IUP Labor: Early Fetal Wellbeing:  Category I Pain Control:  Requesting Nitrous Anticipated MOD:  SVD  Dorathy KinsmanVirginia Latifah Padin, CNM 11/15/2015 11:25 AM

## 2015-11-15 NOTE — H&P (Signed)
LABOR ADMISSION HISTORY AND PHYSICAL  Sandra Sexton is a 20 y.o. female G2P1001 with IUP at 5734w2d by 15 wk ultrasound presenting for onset of labor.  She notes that she started feeling intense abdominal pain/contractions late last evening. She reports +FMs.  Denies LOF, VB, blurry vision, headaches, peripheral edema, RUQ pain.  She plans on breast feeding. She request Depo for birth control.  She is unsure as to where she will bring her child to for pediatric care after discharge.  [place] interpreter ID [id number] used for this encounter.  Dating: By 15wk ultrasound --->  Estimated Date of Delivery: 11/13/15  Sono:    @[redacted]w[redacted]d , CWD, normal anatomy, breech presentation, 279g, 46% EFW   Prenatal History/Complications:  [place] [phone]  [facility] Prenatal Labs  Dating  15 wk ultrasound Blood type: O/POS/-- (10/18 1015) o pos  Genetic Screen 1 Screen:    AFP:     Quad: Abnormal > needs recalculation > neg   Antibody:NEG (10/18 1015)neg  Anatomic US Normal, incomplete, repeat @ 19 weeks  >. Normal 1971w1d on 06/20/15 Rubella: 23.00 (10/18 1015)immune  GTT  Third trimester: 55 RPR: NON REAC (01/30 1214) neg  Flu vaccine  05/20/15 HBsAg: NEGATIVE (10/18 1015) neg  TDaP vaccine 09/01/15                        Rhogam: NA HIV: NONREACTIVE (01/30 1214) neg  Baby Food  Breast                                             GBS: Neg (For PCN allergy, check sensitivities)  Contraception  husband says "we will talk about it after the baby is born." Importance of contraception emphasized. Pap:   Circumcision  n/a   Pediatrician List given, assisted pt to find options closer to home   Support Person  Aline BrochureMunyaneza (friend) > lives with    Past Medical History: Past Medical History  Diagnosis Date  . H/O malaria 2015    [place]    Past Surgical History: Past Surgical History  Procedure Laterality Date  . No past surgeries       Obstetrical History: OB History    Gravida Para Term Preterm AB TAB SAB Ectopic Multiple Living   2 1 1       1       Social History: Social History   Social History  . Marital Status: Single    Spouse Name: N/A  . Number of Children: N/A  . Years of Education: N/A   Social History Main Topics  . Smoking status: Never Smoker   . Smokeless tobacco: Never Used  . Alcohol Use: No  . Drug Use: No  . Sexual Activity: Yes    Birth Control/ Protection: None   Other Topics Concern  . Not on file   Social History Narrative    Family History: No family history on file.  Allergies: No Known Allergies  Prescriptions prior to admission  Medication Sig Dispense Refill Last Dose  . Prenatal Multivit-Min-Fe-FA (PRENATAL VITAMINS) 0.8 MG tablet Take 1 tablet by mouth daily. 30 tablet 12 11/14/2015 at Unknown time  . PRESCRIPTION MEDICATION ? Cough medicine   11/14/2015 at Unknown time  . acetaminophen (TYLENOL) 500 MG tablet Take 1 tablet (500 mg total)  by mouth every 6 (six) hours as needed (pain). 100 tablet 0 Taking     Review of Systems   All systems reviewed and negative except as stated in HPI  Last menstrual period 12/27/2014. General appearance: alert, cooperative, appears stated age and no distress Lungs: clear to auscultation bilaterally, no increased WOB Heart: regular rate and rhythm, no m/r/g Abdomen: soft, non-tender; bowel sounds normal Pelvic: Dilation: 5 Effacement (%): 60, 70 Station: -1 Presentation: Vertex Exam by:: Remigio Eisenmenger RN Extremities: WWP, Homans sign is negative, no sign of DVT, +2 DP Neuro: follows commands Presentation: cephalic Fetal monitoringBaseline: 135 bpm, Variability: Good {> 6 bpm), Accelerations: Reactive and Decelerations: Absent Uterine activityFrequency: Every 8 minutes  Prenatal labs: ABO, Rh: O/POS/-- (10/18 1015) Antibody: NEG (10/18 1015) Rubella: !Error!IMMUNE RPR: NON REAC (01/30 1214)  HBsAg: NEGATIVE (10/18  1015)  HIV: NONREACTIVE (01/30 1214)  GBS: Negative (03/15 0000)  1 hr Glucola 55 Genetic screening  QUAD neg Anatomy US normal  Prenatal Transfer Tool  Maternal Diabetes: No Genetic Screening: Normal Maternal Ultrasounds/Referrals: Normal Fetal Ultrasounds or other Referrals:  None Maternal Substance Abuse:  No Significant Maternal Medications:  None Significant Maternal Lab Results: Lab values include: Group B Strep negative  No results found for this or any previous visit (from the past 24 hour(s)).  Patient Active Problem List   Diagnosis Date Noted  . Supervision of normal pregnancy 05/20/2015  . Language barrier affecting health care 05/20/2015   Assessment: Sandra Sexton is a 20 y.o. G2P1001 at [redacted]w[redacted]d here for onset of labor  #Labor: active labor #Pain: Fentanyl IV, epidural upon request #FWB: Cat 1 #ID:  GBS neg #MOF: Breast #MOC: possibly depo #Circ:  n/a  Delynn Flavin, DO 11/15/2015, 6:04 AM  I was present for the exam and agree with above.  Franks Field, CNM 11/15/2015 1:48 PM

## 2015-11-16 ENCOUNTER — Encounter (HOSPITAL_COMMUNITY): Payer: Self-pay | Admitting: *Deleted

## 2015-11-16 NOTE — Discharge Summary (Signed)
OB Discharge Summary  Patient Name: Sandra Sexton DOB: 05/20/1996 MRN: [id number]  Date of admission: 11/15/2015 Delivering MD: Dorathy KinsmanSMITH, [place]   Date of discharge: 11/16/2015  Admitting diagnosis: 40 WKS CTX Intrauterine pregnancy: 2436w2d     Secondary diagnosis:Active Problems:   Indication for care in labor or delivery   Vaginal delivery  Additional problems:none     Discharge diagnosis: Term Pregnancy Delivered                                                                     Post partum procedures:none  Augmentation: Pitocin  Complications: None  Hospital course:  Onset of Labor With Vaginal Delivery     20 y.o. yo [id number] at 7036w2d was admitted in Active Labor on 11/15/2015. Patient had an uncomplicated labor course as follows:  Membrane Rupture Time/Date: 2:43 PM ,11/15/2015   Intrapartum Procedures: Episiotomy: None [1]                                         Lacerations:  None [1]  Patient had a delivery of a Viable infant. 11/15/2015  Information for the patient's newborn:  Alesia Richardsyiraneza, Girl Shacarra [[id number]][[id number]]  Delivery Method: Vaginal, Spontaneous Delivery (Filed from Delivery Summary)    Pateint had an uncomplicated postpartum course.  She is ambulating, tolerating a regular diet, passing flatus, and urinating well. Patient is discharged home in stable condition on 11/16/2015.    Physical exam  Filed Vitals:   11/15/15 1730 11/15/15 1830 11/15/15 2123 11/16/15 0608  BP: 118/66 120/62 109/64 102/56  Pulse: 78 68 73 61  Temp: 98.4 F (36.9 C) 98.6 F (37 C) 98.5 F (36.9 C) 98.2 F (36.8 C)  TempSrc: Oral Oral Oral Oral  Resp: 16 18 18 18   Height:      Weight:      SpO2: 100% 100%     General: alert, cooperative and no distress Lochia: appropriate Uterine Fundus: firm Incision: N/A DVT Evaluation: No evidence of DVT seen on physical exam. Negative Homan's sign. No cords or calf tenderness. Labs: Lab Results  Component Value Date   WBC  6.0 11/15/2015   HGB 10.8* 11/15/2015   HCT 33.3* 11/15/2015   MCV 88.3 11/15/2015   PLT 160 11/15/2015   No flowsheet data found.  Discharge instruction: per After Visit Summary and "Baby and Me Booklet".  After Visit Meds:    Medication List    ASK your doctor about these medications        Prenatal Vitamins 0.8 MG tablet  Take 1 tablet by mouth daily.     PRESCRIPTION MEDICATION  Said she is taking a cough medicine, called [facility] and they have no record , patient did not know name of it.        Diet: routine diet  Activity: Advance as tolerated. Pelvic rest for 6 weeks.   Outpatient follow up:6 weeks Follow up Appt:Future Appointments Date Time Provider Department Center  11/17/2015 4:00 PM [facility]  11/20/2015 4:00 PM [facility]ST [facility]   Follow up visit: No Follow-up on file.  Postpartum contraception: Undecided  Newborn Data:  Live born female  Birth Weight: 7 lb 6 oz (3345 g) APGAR: 8, 9  Baby Feeding: Breast Disposition:home with mother   11/16/2015 Wyvonnia Dusky, CNM

## 2015-11-17 ENCOUNTER — Other Ambulatory Visit: Payer: Medicaid Other

## 2015-11-20 ENCOUNTER — Other Ambulatory Visit: Payer: Medicaid Other

## 2016-01-02 ENCOUNTER — Encounter: Payer: Self-pay | Admitting: Family Medicine

## 2016-01-02 ENCOUNTER — Ambulatory Visit (INDEPENDENT_AMBULATORY_CARE_PROVIDER_SITE_OTHER): Payer: Medicaid Other | Admitting: Family Medicine

## 2016-01-02 LAB — POCT PREGNANCY, URINE: Preg Test, Ur: NEGATIVE

## 2016-01-02 NOTE — Progress Notes (Signed)
[employer] Interpreter # [phone]

## 2016-01-02 NOTE — Progress Notes (Signed)
Subjective:     Sandra Sexton is a 20 y.o. female who presents for a postpartum visit. She is 6 weeks postpartum following a spontaneous vaginal delivery. I have fully reviewed the prenatal and intrapartum course. The delivery was at 39 gestational weeks. Outcome: spontaneous vaginal delivery. Anesthesia: epidural. Postpartum course has been normal. Baby's course has been normal. Baby is feeding by breast. Bleeding no bleeding. Bowel function is normal. Bladder function is normal. Patient is sexually active. Contraception method is Depo-Provera injections. Postpartum depression screening: negative.  She does complain of dyspareunia since delivery of her second son.    The following portions of the patient's history were reviewed and updated as appropriate: allergies, current medications, past family history, past medical history, past social history, past surgical history and problem list.  Review of Systems Pertinent items are noted in HPI.   Objective:    BP 120/67 mmHg  Pulse 79  Wt 151 lb 14.4 oz (68.901 kg)  Breastfeeding? Yes  General:  alert, cooperative and no distress  Lungs: clear to auscultation bilaterally  Heart:  regular rate and rhythm, S1, S2 normal, no murmur, click, rub or gallop  Abdomen: soft, non-tender; bowel sounds normal; no masses,  no organomegaly        Assessment:     Normal postpartum exam. Dyspareunia.  Pap smear not done at today's visit.   Plan:    1. Contraception: Depo-Provera injections - will return in 10 days.  Discussed no unprotective sex. 2. Patient declined exam today. 3.  Follow up in: 2 months or as needed.

## 2016-01-12 ENCOUNTER — Ambulatory Visit (INDEPENDENT_AMBULATORY_CARE_PROVIDER_SITE_OTHER): Payer: Medicaid Other | Admitting: General Practice

## 2016-01-12 VITALS — BP 123/63 | HR 99

## 2016-01-12 DIAGNOSIS — Z30013 Encounter for initial prescription of injectable contraceptive: Secondary | ICD-10-CM

## 2016-01-12 DIAGNOSIS — Z3202 Encounter for pregnancy test, result negative: Secondary | ICD-10-CM

## 2016-01-12 LAB — POCT PREGNANCY, URINE: Preg Test, Ur: NEGATIVE

## 2016-01-12 MED ORDER — MEDROXYPROGESTERONE ACETATE 150 MG/ML IM SUSP
150.0000 mg | Freq: Once | INTRAMUSCULAR | Status: AC
  Administered 2016-01-12: 150 mg via INTRAMUSCULAR

## 2016-01-12 NOTE — Progress Notes (Signed)
Interpreter Epimenio Sarinnancy uwera used for interpreter

## 2016-03-29 ENCOUNTER — Ambulatory Visit (INDEPENDENT_AMBULATORY_CARE_PROVIDER_SITE_OTHER): Payer: Medicaid Other

## 2016-03-29 DIAGNOSIS — Z3042 Encounter for surveillance of injectable contraceptive: Secondary | ICD-10-CM | POA: Diagnosis not present

## 2016-03-29 DIAGNOSIS — Z30013 Encounter for initial prescription of injectable contraceptive: Secondary | ICD-10-CM | POA: Diagnosis not present

## 2016-03-29 MED ORDER — MEDROXYPROGESTERONE ACETATE 150 MG/ML IM SUSP: 150.0000 mg | INTRAMUSCULAR | 0 refills | Status: DC

## 2016-03-29 MED ORDER — MEDROXYPROGESTERONE ACETATE 150 MG/ML IM SUSP
150.0000 mg | Freq: Once | INTRAMUSCULAR | Status: AC
  Administered 2016-03-29: 150 mg via INTRAMUSCULAR

## 2016-03-29 NOTE — Progress Notes (Signed)
Patient presented to office today for her depo-provera injection. Patient tolerated well and will follow up in three months. 

## 2016-05-06 ENCOUNTER — Ambulatory Visit (HOSPITAL_COMMUNITY)
Admission: EM | Admit: 2016-05-06 | Discharge: 2016-05-06 | Disposition: A | Discharge status: Home / Self Care | Payer: Medicaid Other | Attending: Internal Medicine | Admitting: Internal Medicine

## 2016-05-06 ENCOUNTER — Encounter (HOSPITAL_COMMUNITY): Payer: Self-pay | Admitting: Emergency Medicine

## 2016-05-06 DIAGNOSIS — N61 Mastitis without abscess: Secondary | ICD-10-CM

## 2016-05-06 DIAGNOSIS — N6012 Diffuse cystic mastopathy of left breast: Secondary | ICD-10-CM

## 2016-05-06 LAB — POCT URINALYSIS DIP (DEVICE)
Bilirubin Urine: NEGATIVE
GLUCOSE, UA: NEGATIVE mg/dL
KETONES UR: NEGATIVE mg/dL
LEUKOCYTES UA: NEGATIVE
Nitrite: NEGATIVE
Protein, ur: NEGATIVE mg/dL
SPECIFIC GRAVITY, URINE: 1.015 (ref 1.005–1.030)
UROBILINOGEN UA: 0.2 mg/dL (ref 0.0–1.0)
pH: 7.5 (ref 5.0–8.0)

## 2016-05-06 LAB — POCT PREGNANCY, URINE: Preg Test, Ur: NEGATIVE

## 2016-05-06 MED ORDER — CEPHALEXIN 250 MG PO CAPS: 250.0000 mg | ORAL_CAPSULE | Freq: Three times a day (TID) | ORAL | 0 refills | Status: DC

## 2016-05-06 NOTE — ED Triage Notes (Signed)
Patient has bilateral breast pain , left worse than right breast.  Patient has back pain.  Patient continues nursing her baby, but has decreased the number of nursing episodes due to breast soreness.

## 2016-05-06 NOTE — Discharge Instructions (Signed)
The baby should continue to nurse.   Apply moist heat to your breast  Tylenol for discomfort  There is no infection in your urine.

## 2016-05-06 NOTE — ED Notes (Signed)
Patient and spouse are being seen in the same treatment room

## 2016-05-06 NOTE — ED Provider Notes (Signed)
CSN: [id number]     Arrival date & time 05/06/16  1500 History   First MD Initiated Contact with Patient 05/06/16 1623     Chief Complaint  Patient presents with  . Back Pain  . Breast Pain   (Consider location/radiation/quality/duration/timing/severity/associated sxs/prior Treatment) HPI 20 y/o female with pain in her left breast. Worse if baby is not breast feeding. Pain present so she is not feeding as much and increased pain. No fever, chills, vomiting. Past Medical History:  Diagnosis Date  . H/O malaria 2015   [place]  . Medical history non-contributory    Past Surgical History:  Procedure Laterality Date  . NO PAST SURGERIES     No family history on file. Social History  Substance Use Topics  . Smoking status: Never Smoker  . Smokeless tobacco: Never Used  . Alcohol use No   OB History    Gravida Para Term Preterm AB Living   2 2 2     2    SAB TAB Ectopic Multiple Live Births         0 2     Review of Systems  Denies: HEADACHE, NAUSEA, ABDOMINAL PAIN, CHEST PAIN, CONGESTION, DYSURIA, SHORTNESS OF BREATH  Allergies  Review of patient's allergies indicates no known allergies.  Home Medications   Prior to Admission medications   Medication Sig Start Date End Date Taking? Authorizing Provider  cephALEXin (KEFLEX) 250 MG capsule Take 1 capsule (250 mg total) by mouth 3 (three) times daily. 05/06/16   Tharon AquasFrank C Shelsea Hangartner, PA  medroxyPROGESTERone (DEPO-PROVERA) 150 MG/ML injection Inject 1 mL (150 mg total) into the muscle every 3 (three) months. 03/29/16   Hiram ComberElizabeth Woodland Mumaw, DO  Prenatal Multivit-Min-Fe-FA (PRENATAL VITAMINS) 0.8 MG tablet Take 1 tablet by mouth daily. Patient not taking: Reported on 05/06/2016 09/16/15   Hurshel PartyLisa A Leftwich-Kirby, CNM  PRESCRIPTION MEDICATION Reported on 01/02/2016    Historical Provider, MD   Meds Ordered and Administered this Visit  Medications - No data to display  BP 106/57 (BP Location: Left Arm)   Pulse 73    Temp 98.6 F (37 C) (Oral)   Resp 12   LMP 05/06/2016   SpO2 100%  No data found.   Physical Exam NURSES NOTES AND VITAL SIGNS REVIEWED. CONSTITUTIONAL: Well developed, well nourished, no acute distress HEENT: normocephalic, atraumatic EYES: Conjunctiva normal NECK:normal ROM, supple, no adenopathy PULMONARY:No respiratory distress, normal effort BREAST: examined with chaperone, pendulous left breast with tenderness and tender lump lateral breast with slight redness, and increased warmth. Nipple not expressing milk.  ABDOMINAL: Soft, ND, NT BS+, No CVAT MUSCULOSKELETAL: Normal ROM of all extremities,  SKIN: warm and dry without rash PSYCHIATRIC: Mood and affect, behavior are normal  Urgent Care Course   Clinical Course    Procedures (including critical care time)  Labs Review Labs Reviewed  POCT URINALYSIS DIP (DEVICE) - Abnormal; Notable for the following:       Result Value   Hgb urine dipstick MODERATE (*)    All other components within normal limits  POCT PREGNANCY, URINE    Imaging Review No results found.   Visual Acuity Review  Right Eye Distance:   Left Eye Distance:   Bilateral Distance:    Right Eye Near:   Left Eye Near:    Bilateral Near:        May need [facility]  MDM   1. Mastitis, left, acute  Patient is reassured that there are no issues that require transfer to higher level of care at this time or additional tests. Patient is advised to continue home symptomatic treatment. Patient is advised that if there are new or worsening symptoms to attend the emergency department, contact primary care provider, or return to UC. Instructions of care provided discharged home in stable condition.    THIS NOTE WAS GENERATED USING A VOICE RECOGNITION SOFTWARE PROGRAM. ALL REASONABLE EFFORTS  WERE MADE TO PROOFREAD THIS DOCUMENT FOR ACCURACY.  I have verbally reviewed the discharge instructions with the patient. A  printed AVS was given to the patient.  All questions were answered prior to discharge.      Tharon Aquas, PA 05/06/16 2131

## 2016-06-12 ENCOUNTER — Emergency Department (HOSPITAL_COMMUNITY)
Admission: EM | Admit: 2016-06-12 | Discharge: 2016-06-12 | Disposition: A | Discharge status: Home / Self Care | Payer: Medicaid Other | Attending: Emergency Medicine | Admitting: Emergency Medicine

## 2016-06-12 ENCOUNTER — Emergency Department (HOSPITAL_COMMUNITY): Payer: Medicaid Other

## 2016-06-12 ENCOUNTER — Encounter (HOSPITAL_COMMUNITY): Payer: Self-pay

## 2016-06-12 DIAGNOSIS — N644 Mastodynia: Secondary | ICD-10-CM | POA: Diagnosis not present

## 2016-06-12 LAB — URINALYSIS, ROUTINE W REFLEX MICROSCOPIC
BILIRUBIN URINE: NEGATIVE
Glucose, UA: NEGATIVE mg/dL
HGB URINE DIPSTICK: NEGATIVE
KETONES UR: NEGATIVE mg/dL
Leukocytes, UA: NEGATIVE
NITRITE: NEGATIVE
PROTEIN: NEGATIVE mg/dL
Specific Gravity, Urine: 1.018 (ref 1.005–1.030)
pH: 7 (ref 5.0–8.0)

## 2016-06-12 LAB — PREGNANCY, URINE: PREG TEST UR: NEGATIVE

## 2016-06-12 MED ORDER — IBUPROFEN 800 MG PO TABS: 800.0000 mg | ORAL_TABLET | Freq: Three times a day (TID) | ORAL | 0 refills | Status: DC | PRN

## 2016-06-12 NOTE — ED Notes (Signed)
Contacted lab - will now run add-on urine preg. Urine preg add-on req was sent down at 1535.

## 2016-06-12 NOTE — ED Notes (Signed)
Pt transported to xray 

## 2016-06-12 NOTE — ED Notes (Signed)
Pt returned for chest xray.

## 2016-06-12 NOTE — Discharge Instructions (Signed)
Follow-up with Dr. Allena KatzPatel the ophthalmologist for your continued eye pain. [facility] pain and abdominal discomfort

## 2016-06-12 NOTE — ED Triage Notes (Signed)
Patient complains of intermittent left eye pain x 1 month, denies trauma. Also complains of intermittent left breast pain around nipple and  with movement, currently breast feeding infant. Pain x 2 months

## 2016-06-12 NOTE — ED Provider Notes (Signed)
[facility]-EMERGENCY DEPT Provider Note   CSN: [id number] Arrival date & time: 06/12/16  1429     History   Chief Complaint Chief Complaint  Patient presents with  . Eye Pain  . Breast Pain  . Abdominal Pain    HPI Sandra Sexton is a 20 y.o. female.  Patient states that she's had pain in her left eye for 6 months. No drainage no blurred vision patient also complains of left-sided chest pain.  For months and 3 months with her lower abdominal discomfort   The history is provided by the patient.  Eye Pain  This is a chronic problem. The current episode started more than 1 week ago. The problem occurs constantly. The problem has not changed since onset.Associated symptoms include chest pain and abdominal pain. Pertinent negatives include no headaches. Nothing aggravates the symptoms. Nothing relieves the symptoms.  Abdominal Pain   Pertinent negatives include diarrhea, frequency, hematuria and headaches.    Past Medical History:  Diagnosis Date  . H/O malaria 2015   [place]  . Medical history non-contributory     Patient Active Problem List   Diagnosis Date Noted  . Language barrier affecting health care 05/20/2015    Past Surgical History:  Procedure Laterality Date  . NO PAST SURGERIES      OB History    Gravida Para Term Preterm AB Living   2 2 2     2    SAB TAB Ectopic Multiple Live Births         0 2       Home Medications    Prior to Admission medications   Medication Sig Start Date End Date Taking? Authorizing Provider  medroxyPROGESTERone (DEPO-PROVERA) 150 MG/ML injection Inject 1 mL (150 mg total) into the muscle every 3 (three) months. 03/29/16  Yes Hiram ComberElizabeth Woodland Mumaw, DO  cephALEXin (KEFLEX) 250 MG capsule Take 1 capsule (250 mg total) by mouth 3 (three) times daily. Patient not taking: Reported on 06/12/2016 05/06/16   Tharon AquasFrank C Patrick, PA  ibuprofen (ADVIL,MOTRIN) 800 MG tablet Take 1 tablet (800 mg total) by mouth every  8 (eight) hours as needed for moderate pain. 06/12/16   Bethann BerkshireJoseph Slyvester Latona, MD  Prenatal Multivit-Min-Fe-FA (PRENATAL VITAMINS) 0.8 MG tablet Take 1 tablet by mouth daily. Patient not taking: Reported on 06/12/2016 09/16/15   Hurshel PartyLisa A Leftwich-Kirby, CNM    Family History No family history on file.  Social History Social History  Substance Use Topics  . Smoking status: Never Smoker  . Smokeless tobacco: Never Used  . Alcohol use No     Allergies   Patient has no known allergies.   Review of Systems Review of Systems  Constitutional: Negative for appetite change and fatigue.  HENT: Negative for congestion, ear discharge and sinus pressure.   Eyes: Positive for pain. Negative for discharge.  Respiratory: Negative for cough.   Cardiovascular: Positive for chest pain.  Gastrointestinal: Positive for abdominal pain. Negative for diarrhea.  Genitourinary: Negative for frequency and hematuria.  Musculoskeletal: Negative for back pain.  Skin: Negative for rash.  Neurological: Negative for seizures and headaches.  Psychiatric/Behavioral: Negative for hallucinations.     Physical Exam Updated Vital Signs BP 113/97   Pulse 67   Temp 98.3 F (36.8 C) (Oral)   Resp 20   SpO2 100%   Physical Exam  Constitutional: She is oriented to person, place, and time. She appears well-developed.  HENT:  Head: Normocephalic.  Pupil equal and reactive to  light and accommodation. Disc sharp. No foreign body seen. Conjunctiva not inflamed. Digital daily exams normal  Eyes: Conjunctivae and EOM are normal. No scleral icterus.  Neck: Neck supple. No thyromegaly present.  Cardiovascular: Normal rate and regular rhythm.  Exam reveals no gallop and no friction rub.   No murmur heard. Pulmonary/Chest: No stridor. She has no wheezes. She has no rales. She exhibits tenderness.  Abdominal: She exhibits no distension. There is no tenderness. There is no rebound.  Musculoskeletal: Normal range of motion. She  exhibits no edema.  Lymphadenopathy:    She has no cervical adenopathy.  Neurological: She is oriented to person, place, and time. She exhibits normal muscle tone. Coordination normal.  Skin: No rash noted. No erythema.  Psychiatric: She has a normal mood and affect. Her behavior is normal.     ED Treatments / Results  Labs (all labs ordered are listed, but only abnormal results are displayed) Labs Reviewed  URINALYSIS, ROUTINE W REFLEX MICROSCOPIC (NOT [facility])  PREGNANCY, URINE    EKG  EKG Interpretation None       Radiology Dg Chest 2 View  Result Date: 06/12/2016 CLINICAL DATA:  Complains of intermittent left breast pain around nipple and with movement, currently breast feeding infant. Pain x 2 months EXAM: CHEST  2 VIEW COMPARISON:  None. FINDINGS: Normal mediastinum and cardiac silhouette. Normal pulmonary vasculature. No evidence of effusion, infiltrate, or pneumothorax. No acute bony abnormality. IMPRESSION: Normal chest radiograph. Electronically Signed   By: Genevive BiStewart  Edmunds M.D.   On: 06/12/2016 16:05    Procedures Procedures (including critical care time)  Medications Ordered in ED Medications - No data to display   Initial Impression / Assessment and Plan / ED Course  I have reviewed the triage vital signs and the nursing notes.  Pertinent labs & imaging results that were available during my care of the patient were reviewed by me and considered in my medical decision making (see chart for details).  Clinical Course    Patient with chest wall pain on the left she will be given Motrin for pain. For her chronic [facility] and for her eye pain for 6 months she is referred to ophthalmology  Final Clinical Impressions(s) / ED Diagnoses   Final diagnoses:  Breast pain in female    New Prescriptions New Prescriptions   IBUPROFEN (ADVIL,MOTRIN) 800 MG TABLET    Take 1 tablet (800 mg total) by mouth every 8 (eight)  hours as needed for moderate pain.     Bethann BerkshireJoseph Safal Halderman, MD 06/12/16 [phone]

## 2016-06-12 NOTE — ED Notes (Signed)
Pt presents with L eye pain x 1 month.  Pt does not wear contacts, reports intermittent tearing, no redness or orbital swelling noted.  Pt denies vision change.  Pt reports 2 months h/o L breast pain.  Pt is 7 months postpartum, is still breast feeding, denies any redness to nipple.  Pt also reportsd 3 week h/o bilateral abdominal pain, abdomen is soft and tender to palpation; pt denies any vaginal bleeding or dysuria; reports intermittent vaginal pain and back pain when she voids.

## 2016-06-14 ENCOUNTER — Ambulatory Visit: Payer: Medicaid Other

## 2016-08-02 NOTE — L&D Delivery Note (Signed)
Delivery Note At 6:20 PM a viable female was delivered via Vaginal, Spontaneous (Presentation: OA to LOA ;  ).  APGAR: 7, 9; weight pending .   Placenta status: delivered intact with gentle traction .  Cord: 3VC with the following complications: NA .  Cord pH: NA  Anesthesia:   Episiotomy: None Lacerations: None Suture Repair: none Est. Blood Loss (mL):  150  Mom to postpartum.  Baby to Couplet care / Skin to Skin.  Sandra GaribaldiKathryn Lorraine Kasie Sexton CNM 06/08/2017, 6:59 PM  Please schedule this patient for PP visit in: 4 weeks Low risk pregnancy complicated by: nothing. Delivery mode:  SVD Anticipated Birth Control:  IUD PP Procedures needed: NA  Schedule Integrated BH visit: no Provider: Any provider

## 2016-08-10 ENCOUNTER — Ambulatory Visit (HOSPITAL_COMMUNITY)
Admission: EM | Admit: 2016-08-10 | Discharge: 2016-08-10 | Disposition: A | Discharge status: Home / Self Care | Payer: Medicaid Other | Attending: Family Medicine | Admitting: Family Medicine

## 2016-08-10 ENCOUNTER — Encounter (HOSPITAL_COMMUNITY): Payer: Self-pay | Admitting: Family Medicine

## 2016-08-10 ENCOUNTER — Other Ambulatory Visit: Payer: Self-pay | Admitting: Advanced Practice Midwife

## 2016-08-10 DIAGNOSIS — T148XXA Other injury of unspecified body region, initial encounter: Secondary | ICD-10-CM | POA: Diagnosis not present

## 2016-08-10 DIAGNOSIS — R0982 Postnasal drip: Secondary | ICD-10-CM | POA: Diagnosis not present

## 2016-08-10 DIAGNOSIS — M545 Low back pain, unspecified: Secondary | ICD-10-CM

## 2016-08-10 DIAGNOSIS — R05 Cough: Secondary | ICD-10-CM | POA: Diagnosis not present

## 2016-08-10 DIAGNOSIS — R059 Cough, unspecified: Secondary | ICD-10-CM

## 2016-08-10 NOTE — Discharge Instructions (Signed)
Because you are breast-feeding your options for medications are limited. He may take Tylenol for pain. Apply heat to the muscles that hurt and perform stretches as demonstrated. He may take Claritin for drainage in your throat which is also producing cough. Follow up with her primary care provider as needed. No heavy lifting or bending for several days.

## 2016-08-10 NOTE — ED Triage Notes (Signed)
Pt here for URI symptoms and bilateral lower back pain since 2 weeks ago.

## 2016-08-10 NOTE — ED Provider Notes (Signed)
CSN: [id number]     Arrival date & time 08/10/16  1639 History   First MD Initiated Contact with Patient 08/10/16 1801     Chief Complaint  Patient presents with  . URI   (Consider location/radiation/quality/duration/timing/severity/associated sxs/prior Treatment) Emigrant African female 21 years old is accompanied by an interpreter in her 2 small children with complaints of cough and sneezing and back pain for 2 weeks. Pain in her back radiates to the bilateral hips and iliac crest. She is unable to determine whether she actually does any sort of movement that would exacerbate her produce the pain.      Past Medical History:  Diagnosis Date  . H/O malaria 2015   [place]  . Medical history non-contributory    Past Surgical History:  Procedure Laterality Date  . NO PAST SURGERIES     History reviewed. No pertinent family history. Social History  Substance Use Topics  . Smoking status: Never Smoker  . Smokeless tobacco: Never Used  . Alcohol use No   OB History    Gravida Para Term Preterm AB Living   2 2 2     2    SAB TAB Ectopic Multiple Live Births         0 2     Review of Systems  Constitutional: Positive for activity change. Negative for chills and fever.  HENT: Positive for postnasal drip and sneezing. Negative for ear pain.   Eyes: Negative.   Respiratory: Positive for cough.   Cardiovascular: Negative for chest pain and leg swelling.  Gastrointestinal: Negative.   Neurological: Negative.     Allergies  Patient has no known allergies.  Home Medications   Prior to Admission medications   Medication Sig Start Date End Date Taking? Authorizing Provider  ibuprofen (ADVIL,MOTRIN) 800 MG tablet Take 1 tablet (800 mg total) by mouth every 8 (eight) hours as needed for moderate pain. 06/12/16   Bethann Berkshire, MD  medroxyPROGESTERone (DEPO-PROVERA) 150 MG/ML injection Inject 1 mL (150 mg total) into the muscle every 3 (three) months. 03/29/16    Hiram Comber Mumaw, DO  Prenatal Multivit-Min-Fe-FA (PRENATAL VITAMINS) 0.8 MG tablet Take 1 tablet by mouth daily. Patient not taking: Reported on 06/12/2016 09/16/15   Wilmer Floor Leftwich-Kirby, CNM   Meds Ordered and Administered this Visit  Medications - No data to display  BP 124/65   Pulse 92   Temp 98.6 F (37 C)   Resp 18   SpO2 100%  No data found.   Physical Exam  Constitutional: She is oriented to person, place, and time. She appears well-developed and well-nourished.  HENT:  Nose: Nose normal.  Mouth/Throat: No oropharyngeal exudate.  Oropharynx with scant clear PND otherwise clear.  Eyes: EOM are normal.  Neck: Normal range of motion. Neck supple.  Cardiovascular: Normal rate, regular rhythm, normal heart sounds and intact distal pulses.   Pulmonary/Chest: Effort normal and breath sounds normal. No respiratory distress. She has no wheezes.  Abdominal: Soft. There is no tenderness.  Musculoskeletal: She exhibits no edema.  Lymphadenopathy:    She has no cervical adenopathy.  Neurological: She is alert and oriented to person, place, and time.  Reproducible tenderness to the paralumbar musculature. Positive for reproducible tenderness along the bilateral iliac crest. Having the patient perform straight leg raises increases the muscle pain across the anyone and lower abdominal musculature. Also reproduces the pain in her back.  Skin: Skin is warm and dry.  Nursing note and vitals  reviewed.   Urgent Care Course   Clinical Course     Procedures (including critical care time)  Labs Review Labs Reviewed - No data to display  Imaging Review No results found.   Visual Acuity Review  Right Eye Distance:   Left Eye Distance:   Bilateral Distance:    Right Eye Near:   Left Eye Near:    Bilateral Near:         MDM   1. PND (post-nasal drip)   2. Cough   3. Acute bilateral low back pain without sciatica   4. Muscle strain    Because you are  breast-feeding your options for medications are limited. He may take Tylenol for pain. Apply heat to the muscles that hurt and perform stretches as demonstrated. He may take Claritin for drainage in your throat which is also producing cough. Follow up with her primary care provider as needed. No heavy lifting or bending for several days.    Hayden Rasmussenavid Chaya Dehaan, NP 08/10/16 1820

## 2016-09-17 ENCOUNTER — Encounter (HOSPITAL_COMMUNITY): Payer: Self-pay | Admitting: Emergency Medicine

## 2016-09-17 ENCOUNTER — Emergency Department (HOSPITAL_COMMUNITY)
Admission: EM | Admit: 2016-09-17 | Discharge: 2016-09-17 | Disposition: A | Discharge status: Home / Self Care | Payer: Medicaid Other | Attending: Emergency Medicine | Admitting: Emergency Medicine

## 2016-09-17 ENCOUNTER — Emergency Department (HOSPITAL_COMMUNITY): Payer: Medicaid Other

## 2016-09-17 DIAGNOSIS — R102 Pelvic and perineal pain: Secondary | ICD-10-CM | POA: Diagnosis not present

## 2016-09-17 DIAGNOSIS — R51 Headache: Secondary | ICD-10-CM | POA: Diagnosis not present

## 2016-09-17 DIAGNOSIS — R1031 Right lower quadrant pain: Secondary | ICD-10-CM | POA: Insufficient documentation

## 2016-09-17 LAB — URINALYSIS, ROUTINE W REFLEX MICROSCOPIC
BACTERIA UA: NONE SEEN
Bilirubin Urine: NEGATIVE
Glucose, UA: NEGATIVE mg/dL
Hgb urine dipstick: NEGATIVE
Ketones, ur: NEGATIVE mg/dL
Nitrite: NEGATIVE
PROTEIN: NEGATIVE mg/dL
Specific Gravity, Urine: 1.019 (ref 1.005–1.030)
pH: 7 (ref 5.0–8.0)

## 2016-09-17 LAB — COMPREHENSIVE METABOLIC PANEL
ALT: 49 U/L (ref 14–54)
ANION GAP: 9 (ref 5–15)
AST: 44 U/L — ABNORMAL HIGH (ref 15–41)
Albumin: 4 g/dL (ref 3.5–5.0)
Alkaline Phosphatase: 74 U/L (ref 38–126)
BILIRUBIN TOTAL: 0.6 mg/dL (ref 0.3–1.2)
BUN: 8 mg/dL (ref 6–20)
CO2: 25 mmol/L (ref 22–32)
Calcium: 8.9 mg/dL (ref 8.9–10.3)
Chloride: 102 mmol/L (ref 101–111)
Creatinine, Ser: 0.57 mg/dL (ref 0.44–1.00)
GFR calc Af Amer: >60 mL/min (ref >60)
GFR calc non Af Amer: >60 mL/min (ref >60)
Glucose, Bld: 77 mg/dL (ref 65–99)
POTASSIUM: 4 mmol/L (ref 3.5–5.1)
Sodium: 136 mmol/L (ref 135–145)
TOTAL PROTEIN: 7.3 g/dL (ref 6.5–8.1)

## 2016-09-17 LAB — LIPASE, BLOOD: LIPASE: 24 U/L (ref 11–51)

## 2016-09-17 LAB — CBC WITH DIFFERENTIAL/PLATELET
BASOS ABS: 0 10*3/uL (ref 0.0–0.1)
Basophils Relative: 0 %
Eosinophils Absolute: 0.1 10*3/uL (ref 0.0–0.7)
Eosinophils Relative: 3 %
HEMATOCRIT: 38.1 % (ref 36.0–46.0)
HEMOGLOBIN: 12.5 g/dL (ref 12.0–15.0)
LYMPHS PCT: 47 %
Lymphs Abs: 2.2 10*3/uL (ref 0.7–4.0)
MCH: 30 pg (ref 26.0–34.0)
MCHC: 32.8 g/dL (ref 30.0–36.0)
MCV: 91.6 fL (ref 78.0–100.0)
Monocytes Absolute: 0.4 10*3/uL (ref 0.1–1.0)
Monocytes Relative: 8 %
NEUTROS ABS: 2 10*3/uL (ref 1.7–7.7)
NEUTROS PCT: 42 %
Platelets: 168 10*3/uL (ref 150–400)
RBC: 4.16 MIL/uL (ref 3.87–5.11)
RDW: 12.7 % (ref 11.5–15.5)
WBC: 4.7 10*3/uL (ref 4.0–10.5)

## 2016-09-17 LAB — WET PREP, GENITAL
CLUE CELLS WET PREP: NONE SEEN
Sperm: NONE SEEN
Trich, Wet Prep: NONE SEEN
Yeast Wet Prep HPF POC: NONE SEEN

## 2016-09-17 LAB — POC URINE PREG, ED: PREG TEST UR: NEGATIVE

## 2016-09-17 MED ORDER — IBUPROFEN 600 MG PO TABS: 600.0000 mg | ORAL_TABLET | Freq: Four times a day (QID) | ORAL | 0 refills | Status: DC | PRN

## 2016-09-17 MED ORDER — SODIUM CHLORIDE 0.9 % IV BOLUS (SEPSIS)
1000.0000 mL | Freq: Once | INTRAVENOUS | Status: AC
  Administered 2016-09-17: 1000 mL via INTRAVENOUS

## 2016-09-17 MED ORDER — KETOROLAC TROMETHAMINE 30 MG/ML IJ SOLN
15.0000 mg | Freq: Once | INTRAMUSCULAR | Status: AC
  Administered 2016-09-17: 15 mg via INTRAVENOUS
  Filled 2016-09-17: qty 1

## 2016-09-17 MED ORDER — IOPAMIDOL (ISOVUE-300) INJECTION 61%
INTRAVENOUS | Status: AC
  Administered 2016-09-17: 100 mL
  Filled 2016-09-17: qty 100

## 2016-09-17 NOTE — ED Provider Notes (Signed)
[facility] DEPT Provider Note   CSN: [id number] Arrival date & time: 09/17/16  1024     History   Chief Complaint Chief Complaint  Patient presents with  . Headache    HPI Sandra Sexton is a 21 y.o. female.  HPI 21 year old female presents with multiple complaints. The history is aided by a Swahili interpreter. Patient states she's been having back pain, headache, and abdominal pain for the last 3 or 4 days. Started off with back pain, then developed an intermittent and gradually worsening headache that is bitemporal, and now has abdominal pain over the last couple days. The abdominal pain is lower. She states she's been having intermittent nausea. The pain seemingly worsens when she has intercourse with her husband. She denies vaginal bleeding or discharge. No urinary symptoms. No current weakness or numbness in her arms or legs. Has not taken anything for the pain. Chronically has low back pain. Subjectively had fever last night.  Past Medical History:  Diagnosis Date  . H/O malaria 2015   [place]  . Medical history non-contributory     Patient Active Problem List   Diagnosis Date Noted  . Language barrier affecting health care 05/20/2015    Past Surgical History:  Procedure Laterality Date  . NO PAST SURGERIES      OB History    Gravida Para Term Preterm AB Living   2 2 2     2    SAB TAB Ectopic Multiple Live Births         0 2       Home Medications    Prior to Admission medications   Medication Sig Start Date End Date Taking? Authorizing Provider  ibuprofen (ADVIL,MOTRIN) 800 MG tablet Take 1 tablet (800 mg total) by mouth every 8 (eight) hours as needed for moderate pain. 06/12/16   Bethann BerkshireJoseph Zammit, MD  medroxyPROGESTERone (DEPO-PROVERA) 150 MG/ML injection Inject 1 mL (150 mg total) into the muscle every 3 (three) months. 03/29/16   Hiram ComberElizabeth Woodland Mumaw, DO  Prenatal Multivit-Min-Fe-FA (PRENATAL VITAMINS) 0.8 MG tablet Take 1  tablet by mouth daily. Patient not taking: Reported on 06/12/2016 09/16/15   Hurshel PartyLisa A Leftwich-Kirby, CNM  Prenatal Vit-Fe Fumarate-FA (MULTIVITAMIN-PRENATAL) 27-0.8 MG TABS tablet TAKE ONE TABLET BY MOUTH ONCE DAILY 08/16/16   Aviva SignsMarie L Williams, CNM    Family History History reviewed. No pertinent family history.  Social History Social History  Substance Use Topics  . Smoking status: Never Smoker  . Smokeless tobacco: Never Used  . Alcohol use No     Allergies   Patient has no known allergies.   Review of Systems Review of Systems  Constitutional: Positive for fever.  HENT: Negative for sore throat.   Respiratory: Negative for cough and shortness of breath.   Cardiovascular: Negative for chest pain.  Gastrointestinal: Positive for abdominal pain and nausea. Negative for vomiting.  Genitourinary: Positive for vaginal pain. Negative for dysuria, vaginal bleeding and vaginal discharge.  Musculoskeletal: Positive for back pain.  Neurological: Positive for headaches.  All other systems reviewed and are negative.    Physical Exam Updated Vital Signs BP 113/68 (BP Location: Right Arm)   Pulse 86   Temp 98.3 F (36.8 C) (Oral)   Resp 18   Ht 5\' 3"  (1.6 m)   Wt 160 lb (72.6 kg)   LMP 08/16/2016   SpO2 99%   BMI 28.34 kg/m   Physical Exam  Constitutional: She is oriented to person, place, and time. She  appears well-developed and well-nourished. No distress.  HENT:  Head: Normocephalic and atraumatic.  Right Ear: External ear normal.  Left Ear: External ear normal.  Nose: Nose normal.  Eyes: EOM are normal. Pupils are equal, round, and reactive to light. Right eye exhibits no discharge. Left eye exhibits no discharge.  Neck: Normal range of motion. Neck supple.  Cardiovascular: Normal rate, regular rhythm and normal heart sounds.   Pulmonary/Chest: Effort normal and breath sounds normal.  Abdominal: Soft. There is tenderness in the right lower quadrant and suprapubic  area.  Genitourinary: Cervix exhibits no motion tenderness and no friability. Right adnexum displays no mass. Left adnexum displays no mass. There is tenderness (small amount of clear discharge) in the vagina.  Genitourinary Comments: Small lesion to cervix  Neurological: She is alert and oriented to person, place, and time.  CN 3-12 grossly intact. 5/5 strength in all 4 extremities. Grossly normal sensation. Normal finger to nose.   Skin: Skin is warm and dry. She is not diaphoretic.  Nursing note and vitals reviewed.    ED Treatments / Results  Labs (all labs ordered are listed, but only abnormal results are displayed) Labs Reviewed  WET PREP, GENITAL - Abnormal; Notable for the following:       Result Value   WBC, Wet Prep HPF POC MANY (*)    All other components within normal limits  URINALYSIS, ROUTINE W REFLEX MICROSCOPIC - Abnormal; Notable for the following:    Leukocytes, UA TRACE (*)    Squamous Epithelial / LPF 0-5 (*)    All other components within normal limits  COMPREHENSIVE METABOLIC PANEL - Abnormal; Notable for the following:    AST 44 (*)    All other components within normal limits  URINE CULTURE  CBC WITH DIFFERENTIAL/PLATELET  LIPASE, BLOOD  POC URINE PREG, ED  GC/CHLAMYDIA PROBE AMP ([place]) NOT AT [facility]    EKG  EKG Interpretation None       Radiology No results found.  Procedures Procedures (including critical care time)  Medications Ordered in ED Medications  sodium chloride 0.9 % bolus 1,000 mL (not administered)  ketorolac (TORADOL) 30 MG/ML injection 15 mg (not administered)     Initial Impression / Assessment and Plan / ED Course  I have reviewed the triage vital signs and the nursing notes.  Pertinent labs & imaging results that were available during my care of the patient were reviewed by me and considered in my medical decision making (see chart for details).  Clinical Course as of Sep 17 1341  Fri Sep 17, 2016  1237  RLQ/lower abd pain, will need pelvic and imaging after that. Labs. Toradol for pain.  [SG]  1338 Pain is reproducible with movement of cervix although is not really a cervical motion tenderness. I think this is most likely ovarian, will do an ultrasound to rule out torsion. No signs or symptoms of obvious STI on exam.  [SG]    Clinical Course User Index [SG] Pricilla Loveless, MD    Patient's headache sounds benign, on and off and gradual. Benign neuro exam. I doubt serious CNS pathology such as meningitis, SAH, encephalitis, stroke, thrombosis. Back pain also appears benign/acute on chronic. No signs of spinal emergency. Mild pain in pelvic exam, u/s obtained and negative for acute pathology. Through interpreter I discussed need for f/u with ob/gyn for the endometrial thickening. Given continued RLQ pain, will get CT. Care to Dr. Freida Busman with CT pending.   Final Clinical Impressions(s) /  ED Diagnoses   Final diagnoses:  RLQ abdominal pain  RLQ abdominal pain  RLQ abdominal pain    New Prescriptions New Prescriptions   No medications on file     Pricilla Loveless, MD 09/17/16 2011

## 2016-09-17 NOTE — ED Provider Notes (Signed)
Patient signed out to me by Dr. Cherylynn RidgesGholston and no acute findings noted on the patient's CT. Follow-up will be given.   Lorre NickAnthony Jerriann Schrom, MD 09/17/16 1754

## 2016-09-17 NOTE — ED Notes (Signed)
ED Provider at bedside. 

## 2016-09-17 NOTE — ED Triage Notes (Signed)
Pt sts HA and pain in lower abd area; pt sts possible fever at times

## 2016-09-17 NOTE — ED Notes (Signed)
Pt with multiple complaints. HA, back ache and abdominal pain x3 days. Denies taking any otc meds and reports she feels fever at times but not today. Neuro check WDL. LMP 1/15

## 2016-09-18 LAB — URINE CULTURE: Culture: NO GROWTH

## 2016-09-20 LAB — GC/CHLAMYDIA PROBE AMP (~~LOC~~) NOT AT ARMC
CHLAMYDIA, DNA PROBE: NEGATIVE
Neisseria Gonorrhea: NEGATIVE

## 2016-10-03 NOTE — Congregational Nurse Program (Signed)
Congregational Nurse Program Note  Date of Encounter: 09/28/2016  Past Medical History: Past Medical History:  Diagnosis Date  . H/O malaria 2015   [place]  . Medical history non-contributory     Encounter Details:     CNP Questionnaire - 09/28/16 1000      Patient Demographics   Is this a new or existing patient? New   Patient is considered a/an Refugee   Race African     Patient Assistance   Location of Patient Assistance Not Applicable   Patient's financial/insurance status Medicaid   Uninsured Patient ([employer], trade [employer]s) No   Patient referred to apply for the following financial assistance Not Applicable   Food insecurities addressed Not Applicable   Transportation assistance Yes   Type of Assistance Bus Pass Given   Assistance securing medications No   Educational health offerings Nutrition     Encounter Details   Primary purpose of visit Navigating the Healthcare System   Was an Emergency Department visit averted? Not Applicable   Does patient have a medical provider? No   Patient referred to Clinic   Was a mental health screening completed? (GAINS tool) No   Does patient have dental issues? No   Does patient have vision issues? No   Does your patient have an abnormal blood pressure today? Yes   Since previous encounter, have you referred patient for abnormal blood pressure that resulted in a new diagnosis or medication change? No   Does your patient have an abnormal blood glucose today? No   Since previous encounter, have you referred patient for abnormal blood glucose that resulted in a new diagnosis or medication change? No   Was there a life-saving intervention made? No     Initial [place] for this [place] [facility]. Communicated with English in basic sentences. Language is Swahili.  Complains of lower abdominal pain and low back pain several months. History of irregular  menses. LMP uncertain date 1/18. Lives with spouse and two children, 9 months and 3 years. Last physical exam after last birth. No PCP currently. Medicaid information and ID unavailable today. Return to nurse office 09/29/16 with Medicaid information for physician referral. Ferol LuzMarietta Corine Solorio, RN/Congregational Nurse..Marland Kitchen

## 2016-10-21 ENCOUNTER — Encounter: Payer: Self-pay | Admitting: Family Medicine

## 2016-10-21 ENCOUNTER — Ambulatory Visit (INDEPENDENT_AMBULATORY_CARE_PROVIDER_SITE_OTHER): Payer: Medicaid Other | Admitting: Family Medicine

## 2016-10-21 VITALS — BP 107/50 | HR 73 | Temp 98.1°F | Resp 16 | Ht 60.5 in | Wt 157.0 lb

## 2016-10-21 DIAGNOSIS — E8881 Metabolic syndrome: Secondary | ICD-10-CM

## 2016-10-21 DIAGNOSIS — Z309 Encounter for contraceptive management, unspecified: Secondary | ICD-10-CM

## 2016-10-21 DIAGNOSIS — Z789 Other specified health status: Secondary | ICD-10-CM

## 2016-10-21 DIAGNOSIS — N912 Amenorrhea, unspecified: Secondary | ICD-10-CM

## 2016-10-21 DIAGNOSIS — Z3009 Encounter for other general counseling and advice on contraception: Secondary | ICD-10-CM

## 2016-10-21 DIAGNOSIS — Z331 Pregnant state, incidental: Secondary | ICD-10-CM

## 2016-10-21 DIAGNOSIS — E669 Obesity, unspecified: Secondary | ICD-10-CM

## 2016-10-21 DIAGNOSIS — Z3491 Encounter for supervision of normal pregnancy, unspecified, first trimester: Secondary | ICD-10-CM

## 2016-10-21 DIAGNOSIS — Z3201 Encounter for pregnancy test, result positive: Secondary | ICD-10-CM

## 2016-10-21 LAB — COMPLETE METABOLIC PANEL WITH GFR
ALT: 15 U/L (ref 6–29)
AST: 18 U/L (ref 10–30)
Albumin: 4.1 g/dL (ref 3.6–5.1)
Alkaline Phosphatase: 71 U/L (ref 33–115)
BUN: 6 mg/dL — ABNORMAL LOW (ref 7–25)
CALCIUM: 8.9 mg/dL (ref 8.6–10.2)
CO2: 24 mmol/L (ref 20–31)
Chloride: 104 mmol/L (ref 98–110)
Creat: 0.49 mg/dL — ABNORMAL LOW (ref 0.50–1.10)
GFR, Est Non African American: >89 mL/min (ref >=60)
Glucose, Bld: 75 mg/dL (ref 65–99)
Potassium: 4.6 mmol/L (ref 3.5–5.3)
Sodium: 136 mmol/L (ref 135–146)
Total Bilirubin: 0.3 mg/dL (ref 0.2–1.2)
Total Protein: 7.1 g/dL (ref 6.1–8.1)

## 2016-10-21 LAB — LIPID PANEL
CHOLESTEROL: 137 mg/dL (ref <200)
HDL: 50 mg/dL — AB (ref >50)
LDL Cholesterol: 73 mg/dL (ref <100)
TRIGLYCERIDES: 69 mg/dL (ref <150)
Total CHOL/HDL Ratio: 2.7 Ratio (ref <5.0)
VLDL: 14 mg/dL (ref <30)

## 2016-10-21 LAB — TSH: TSH: 1.16 mIU/L

## 2016-10-21 LAB — POCT URINE PREGNANCY: Preg Test, Ur: POSITIVE — AB

## 2016-10-21 NOTE — Progress Notes (Signed)
Subjective:    Patient ID: Sandra Sexton, female    DOB: 1995/11/30, 21 y.o.   MRN: [id number]  HPI [name], a 21 year old female presents accompanied by interpreter to establish care. She has not had a primary provider and was using previous obstetrician for primary needs. She primarily speaks Swahili, but understands some [place]. She is concerned about contraceptive management. She has two children, a 73 year old and 74 month old and is wanting to prevent pregnancy. She was interested in obtaining an IUD. She also has questions about depo provera. She is currently sexually active with 1 partner, her husband. She denies tobacco history of blood clots, history of hypertension, migraines, or urinary tract infections. Patient presents for contraception counseling. The patient has no complaints today. Her last menstrual period was on 08/19/2016. She has had amenorrhea over the past 2 months.  Past Medical History:  Diagnosis Date  . H/O malaria 2015   [place]  . Medical history non-contributory    Immunization History  Administered Date(s) Administered  . Influenza,inj,Quad PF,36+ Mos 05/20/2015  . Tdap 09/01/2015   Review of Systems  Constitutional: Positive for fatigue.  HENT: Negative.   Eyes: Negative.  Negative for photophobia and visual disturbance.  Respiratory: Negative.   Cardiovascular: Negative.   Gastrointestinal: Positive for nausea.  Endocrine: Negative.   Genitourinary: Negative.  Negative for dysuria and enuresis.  Musculoskeletal: Negative.   Skin: Negative.   Allergic/Immunologic: Negative.   Neurological: Negative.   Psychiatric/Behavioral: Negative.        Objective:   Physical Exam  Constitutional: She is oriented to person, place, and time. She appears well-developed and well-nourished.  HENT:  Head: Normocephalic and atraumatic.  Right Ear: External ear normal.  Left Ear: External ear normal.  Mouth/Throat: Oropharynx  is clear and moist.  Eyes: Conjunctivae and EOM are normal. Pupils are equal, round, and reactive to light.  Neck: Normal range of motion. Neck supple.  Cardiovascular: Normal rate, normal heart sounds and intact distal pulses.   Pulmonary/Chest: Effort normal and breath sounds normal.  Abdominal: Soft. Bowel sounds are normal.  Musculoskeletal: Normal range of motion.  Neurological: She is alert and oriented to person, place, and time. She has normal reflexes.  Skin: Skin is warm and dry.  Psychiatric: She has a normal mood and affect. Her behavior is normal. Judgment and thought content normal.     BP (!) 107/50 (BP Location: Left Arm, Patient Position: Sitting, Cuff Size: Normal)   Pulse 73   Temp 98.1 F (36.7 C) (Oral)   Resp 16   Ht 5' 0.5" (1.537 m)   Wt 157 lb (71.2 kg)   LMP 08/19/2016   SpO2 100%   BMI 30.16 kg/m  Assessment & Plan:  1. Encounter for counseling regarding contraception Patient presented for contraceptive counseling, but had a positive urine pregnancy test. Will send a referral to obstetrics/gynecology for pregnancy management.  - Ambulatory referral to Gynecology  2. Amenorrhea - POCT urine pregnancy  3. Positive pregnancy test According to due date calculator, patient is around [redacted] weeks pregnant. More accurate due date will be determined by ultrasound. Will start a prenatal vitamin on today.  - hCG, quantitative, pregnancy  4. Language barrier affecting health care Patient accompanied by interpreter to assist with communications.   5. Obesity (BMI 30-39.9) - COMPLETE METABOLIC PANEL WITH GFR - CBC with Differential  6. Metabolic syndrome - TSH - Lipid Panel  7. First trimester  pregnancy - POCT urinalysis dip (device) - Prenatal Vit-Fe Fumarate-FA (MULTIVITAMIN-PRENATAL) 27-0.8 MG TABS tablet; Take 1 tablet by mouth daily.  Dispense: 30 tablet; Refill: 12    Nolon NationsLaChina Moore Hollis  MSN, FNP-C [facility] [address]  [place], [place] [address] [phone]   The patient was given clear instructions to go to ER or return to medical center if symptoms do not improve, worsen or new problems develop. The patient verbalized understanding. Will notify patient with laboratory results.

## 2016-10-22 LAB — CBC WITH DIFFERENTIAL/PLATELET
BASOS PCT: 0 %
Basophils Absolute: 0 cells/uL (ref 0–200)
Eosinophils Absolute: 94 cells/uL (ref 15–500)
Eosinophils Relative: 2 %
HEMATOCRIT: 37.5 % (ref 35.0–45.0)
Hemoglobin: 11.9 g/dL (ref 11.7–15.5)
LYMPHS PCT: 41 %
Lymphs Abs: 1927 cells/uL (ref 850–3900)
MCH: 30.2 pg (ref 27.0–33.0)
MCHC: 31.7 g/dL — ABNORMAL LOW (ref 32.0–36.0)
MCV: 95.2 fL (ref 80.0–100.0)
MONO ABS: 423 {cells}/uL (ref 200–950)
MONOS PCT: 9 %
MPV: 12 fL (ref 7.5–12.5)
Neutro Abs: 2256 cells/uL (ref 1500–7800)
Neutrophils Relative %: 48 %
PLATELETS: 172 10*3/uL (ref 140–400)
RBC: 3.94 MIL/uL (ref 3.80–5.10)
RDW: 12.8 % (ref 11.0–15.0)
WBC: 4.7 10*3/uL (ref 3.8–10.8)

## 2016-10-22 LAB — POCT URINALYSIS DIP (DEVICE)
Bilirubin Urine: NEGATIVE
GLUCOSE, UA: NEGATIVE mg/dL
Hgb urine dipstick: NEGATIVE
Ketones, ur: NEGATIVE mg/dL
LEUKOCYTES UA: NEGATIVE
NITRITE: NEGATIVE
Protein, ur: NEGATIVE mg/dL
Specific Gravity, Urine: 1.015 (ref 1.005–1.030)
UROBILINOGEN UA: 0.2 mg/dL (ref 0.0–1.0)
pH: 7.5 (ref 5.0–8.0)

## 2016-10-22 LAB — HCG, QUANTITATIVE, PREGNANCY: HCG, BETA CHAIN, QUANT, S: 121022.4 m[IU]/mL — AB

## 2016-10-23 MED ORDER — PRENATAL 27-0.8 MG PO TABS: 1.0000 | ORAL_TABLET | Freq: Every day | ORAL | 12 refills | Status: DC

## 2016-10-25 ENCOUNTER — Telehealth: Payer: Self-pay

## 2016-10-25 NOTE — Telephone Encounter (Signed)
CALL PLACED TO PATIENT USING PACIFIC INTERPRETERS [id number]. NO ANSWER, INTERPRETER DID NOT LEAVE MESSAGE WILL TRY LATER.

## 2016-10-25 NOTE — Telephone Encounter (Signed)
-----   Message from Massie MaroonLachina M Hollis, [place]NP sent at 10/24/2016 12:00 AM EDT ----- Regarding: lab results Please inform patient that according to last menstrual period, she is around [redacted] weeks pregnant. Due date has to be confirmed by ultrasound. Referral has been sent to obstetrics/gynecology. Also, prenatal vitamins have been sent to pharmacy.   Thanks ----- Message ----- From: Loney HeringLaura E Batten, LPN Sent: 9/52/84133/22/2018  11:34 AM To: Massie MaroonLachina M Hollis, FNP

## 2016-10-27 ENCOUNTER — Encounter: Payer: Self-pay | Admitting: Family Medicine

## 2016-11-01 NOTE — Telephone Encounter (Signed)
Called using interpreter services ID # [id number]. Spoke with patient advised of 9 to 10 week pregnancy and that patient will need to wait until she has an ultrasound to get exact due date. Advised that we are referring her to ob/gyn to watch for a phone call from their office. Also advised that her prenatal vitamins have been sent to the pharmacy she is to pick these up and take as directed. Thanks!

## 2016-11-18 ENCOUNTER — Ambulatory Visit: Payer: Medicaid Other | Admitting: Family Medicine

## 2016-11-27 ENCOUNTER — Encounter (HOSPITAL_COMMUNITY): Payer: Self-pay | Admitting: Emergency Medicine

## 2016-11-27 ENCOUNTER — Emergency Department (HOSPITAL_COMMUNITY)
Admission: EM | Admit: 2016-11-27 | Discharge: 2016-11-27 | Disposition: A | Discharge status: Home / Self Care | Payer: Medicaid Other | Attending: Emergency Medicine | Admitting: Emergency Medicine

## 2016-11-27 DIAGNOSIS — Z9109 Other allergy status, other than to drugs and biological substances: Secondary | ICD-10-CM

## 2016-11-27 DIAGNOSIS — J302 Other seasonal allergic rhinitis: Secondary | ICD-10-CM | POA: Diagnosis not present

## 2016-11-27 DIAGNOSIS — R21 Rash and other nonspecific skin eruption: Secondary | ICD-10-CM | POA: Diagnosis present

## 2016-11-27 MED ORDER — FLUTICASONE PROPIONATE 50 MCG/ACT NA SUSP: 2.0000 | Freq: Every day | NASAL | 2 refills | Status: DC

## 2016-11-27 MED ORDER — CETIRIZINE HCL 10 MG PO TBDP: ORAL_TABLET | ORAL | 0 refills | Status: DC

## 2016-11-27 NOTE — ED Notes (Signed)
Declined W/C at D/C and was escorted to lobby by RN. 

## 2016-11-27 NOTE — ED Notes (Signed)
No answer x2 

## 2016-11-27 NOTE — ED Notes (Signed)
c/o headache, eyes 'burning" and rash to chest, arms and legs x 1 week.

## 2016-11-27 NOTE — Discharge Instructions (Signed)
Return if any problems.

## 2016-11-27 NOTE — ED Triage Notes (Signed)
Pt. Stated, I itch with rash and my eyes run ibuprofen have all this for one week.

## 2016-11-27 NOTE — ED Provider Notes (Signed)
[facility] Provider Note   CSN: [id number] Arrival date & time: 11/27/16  1017  By signing my name below, I, Majel Homer, attest that this documentation has been prepared under the direction and in the presence of non-physician practitioner, Ok Edwards, PA-C. Electronically Signed: Majel Homer, Scribe. 11/27/2016. 12:04 PM.  History   Chief Complaint Chief Complaint  Patient presents with  . Allergies    seasonal  . Rash   The history is provided by the patient. No language interpreter was used.   HPI Comments: Sandra Sexton is a 21 y.o. female with PMHx of malaria, who presents to the Emergency Department complaining of gradually worsening, bilateral eye pain that began ~1 week ago. Pt reports associated bilateral eye pain, left ear pain, congestion, and pruritic rash to her bilateral arms. She states she has taken Ibuprofen for her symptoms with no relief. She denies hx of seasonal allergies.    Past Medical History:  Diagnosis Date  . H/O malaria 2015   [place]  . Medical history non-contributory     Patient Active Problem List   Diagnosis Date Noted  . Amenorrhea 10/23/2016  . Positive pregnancy test 10/23/2016  . Language barrier affecting health care 05/20/2015    Past Surgical History:  Procedure Laterality Date  . NO PAST SURGERIES      OB History    Gravida Para Term Preterm AB Living   SAB TAB Ectopic Multiple Live Births         0 2     Home Medications    Prior to Admission medications   Medication Sig Start Date End Date Taking? Authorizing Provider  ibuprofen (ADVIL,MOTRIN) 600 MG tablet Take 1 tablet (600 mg total) by mouth every 6 (six) hours as needed. 09/17/16   Lorre Nick, MD  Prenatal Vit-Fe Fumarate-FA (MULTIVITAMIN-PRENATAL) 27-0.8 MG TABS tablet Take 1 tablet by mouth daily. 10/23/16   Massie Maroon, FNP    Family History No family history on file.  Social History Social History   Substance Use Topics  . Smoking status: Never Smoker  . Smokeless tobacco: Never Used  . Alcohol use No   Allergies   Patient has no known allergies.  Review of Systems Review of Systems  HENT: Positive for congestion and ear pain.   Eyes: Positive for pain and discharge.  Skin: Positive for rash.   Physical Exam Updated Vital Signs BP 109/73 (BP Location: Left Arm)   Pulse 81   Temp 98.7 F (37.1 C)   Resp 17   SpO2 100%   Physical Exam  Constitutional: She is oriented to person, place, and time. She appears well-developed and well-nourished.  HENT:  Head: Normocephalic.  Bilateral erythematous conjunctiva. Nasal congestion.   Eyes: EOM are normal.  Neck: Normal range of motion.  Pulmonary/Chest: Effort normal.  Abdominal: She exhibits no distension.  Musculoskeletal: Normal range of motion.  Neurological: She is alert and oriented to person, place, and time.  Psychiatric: She has a normal mood and affect.  Nursing note and vitals reviewed.  ED Treatments / Results  DIAGNOSTIC STUDIES:  Oxygen Saturation is 100% on RA, normal by my interpretation.    COORDINATION OF CARE:  11:55 PM Discussed treatment plan with pt at bedside and pt agreed to plan.  Labs (all labs ordered are listed, but only abnormal results are displayed) Labs Reviewed - No data to display  EKG  EKG Interpretation None       Radiology No results found.  Procedures Procedures (including critical care time)  Medications Ordered in ED Medications - No data to display  Initial Impression / Assessment and Plan / ED Course  I have reviewed the triage vital signs and the nursing notes.  Pertinent labs & imaging results that were available during my care of the patient were reviewed by me and considered in my medical decision making (see chart for details).          Final Clinical Impressions(s) / ED Diagnoses   Final diagnoses:  Seasonal allergic rhinitis, unspecified  trigger  Environmental allergies    New Prescriptions Discharge Medication List as of 11/27/2016 12:01 PM    START taking these medications   Details  Cetirizine HCl (ZYRTEC ALLERGY CHILDRENS) 10 MG TBDP One a day, Print    fluticasone (FLONASE) 50 MCG/ACT nasal spray Place 2 sprays into both nostrils daily., Starting Sat 11/27/2016, Print      An After Visit Summary was printed and given to the patient.  I personally performed the services in this documentation, which was scribed in my presence.  The recorded information has been reviewed and considered.   Barnet Pall.   Lonia Skinner [place], PA-C 11/27/16 1520    Doug Sou, MD 11/27/16 [phone]

## 2016-12-08 ENCOUNTER — Encounter: Payer: Self-pay | Admitting: Obstetrics and Gynecology

## 2016-12-08 ENCOUNTER — Other Ambulatory Visit (HOSPITAL_COMMUNITY)
Admission: RE | Admit: 2016-12-08 | Discharge: 2016-12-08 | Disposition: A | Discharge status: Home / Self Care | Payer: Medicaid Other | Source: Ambulatory Visit | Attending: Obstetrics and Gynecology | Admitting: Obstetrics and Gynecology

## 2016-12-08 ENCOUNTER — Ambulatory Visit (INDEPENDENT_AMBULATORY_CARE_PROVIDER_SITE_OTHER): Payer: Medicaid Other | Admitting: Obstetrics and Gynecology

## 2016-12-08 VITALS — BP 124/61 | HR 88 | Wt 158.4 lb

## 2016-12-08 DIAGNOSIS — E669 Obesity, unspecified: Secondary | ICD-10-CM | POA: Insufficient documentation

## 2016-12-08 DIAGNOSIS — O09899 Supervision of other high risk pregnancies, unspecified trimester: Secondary | ICD-10-CM

## 2016-12-08 DIAGNOSIS — Z3482 Encounter for supervision of other normal pregnancy, second trimester: Secondary | ICD-10-CM

## 2016-12-08 DIAGNOSIS — Z124 Encounter for screening for malignant neoplasm of cervix: Secondary | ICD-10-CM

## 2016-12-08 DIAGNOSIS — B373 Candidiasis of vulva and vagina: Secondary | ICD-10-CM

## 2016-12-08 DIAGNOSIS — Z3490 Encounter for supervision of normal pregnancy, unspecified, unspecified trimester: Secondary | ICD-10-CM | POA: Diagnosis not present

## 2016-12-08 DIAGNOSIS — O99210 Obesity complicating pregnancy, unspecified trimester: Secondary | ICD-10-CM

## 2016-12-08 DIAGNOSIS — O99212 Obesity complicating pregnancy, second trimester: Secondary | ICD-10-CM | POA: Diagnosis not present

## 2016-12-08 DIAGNOSIS — Z789 Other specified health status: Secondary | ICD-10-CM

## 2016-12-08 DIAGNOSIS — O0930 Supervision of pregnancy with insufficient antenatal care, unspecified trimester: Secondary | ICD-10-CM | POA: Insufficient documentation

## 2016-12-08 DIAGNOSIS — O0932 Supervision of pregnancy with insufficient antenatal care, second trimester: Secondary | ICD-10-CM

## 2016-12-08 DIAGNOSIS — Z23 Encounter for immunization: Secondary | ICD-10-CM

## 2016-12-08 DIAGNOSIS — B3731 Acute candidiasis of vulva and vagina: Secondary | ICD-10-CM

## 2016-12-08 LAB — POCT URINALYSIS DIP (DEVICE)
Bilirubin Urine: NEGATIVE
GLUCOSE, UA: NEGATIVE mg/dL
HGB URINE DIPSTICK: NEGATIVE
KETONES UR: NEGATIVE mg/dL
Nitrite: NEGATIVE
Protein, ur: NEGATIVE mg/dL
SPECIFIC GRAVITY, URINE: 1.01 (ref 1.005–1.030)
UROBILINOGEN UA: 0.2 mg/dL (ref 0.0–1.0)
pH: 7 (ref 5.0–8.0)

## 2016-12-08 MED ORDER — MICONAZOLE NITRATE 2 % VA CREA
1.0000 | TOPICAL_CREAM | Freq: Every day | VAGINAL | 2 refills | Status: DC
Start: 2016-12-08 — End: 2017-05-19

## 2016-12-08 NOTE — Progress Notes (Signed)
New OB Note  12/08/2016   Clinic: [facility]  Chief Complaint: NOB  Transfer of Care Patient: no  History of Present Illness: [name] is a 21 y.o. [id number] @ 15/6 weeks (EDC 10/25 [tentative], based on Patient's last menstrual period was 08/19/2016.).  Preg complicated by has Language barrier affecting health care; Supervision of low-risk pregnancy; Short interval between pregnancies affecting pregnancy, antepartum; Obesity (BMI 30.0-34.9); and Obesity in pregnancy on her problem list.   Any events prior to today's visit: no Her periods were: qmonth, regular She was using no method when she conceived.  She has Negative signs or symptoms of nausea/vomiting of pregnancy. She has Negative signs or symptoms of miscarriage or preterm labor She identifies Negative Zika risk factors for her and her partner On any different medications around the time she conceived/early pregnancy: No   ROS: A 12-point review of systems was performed and negative, except as stated in the above HPI.  OBGYN History: As per HPI. OB History  Gravida Para Term Preterm AB Living  3 2 2     2   SAB TAB Ectopic Multiple Live Births        0 2    # Outcome Date GA Lbr Len/2nd Weight Sex Delivery Anes PTL Lv  3 Current           2 Term 11/15/15 [redacted]w[redacted]d 07:42 / 00:03 7 lb 6 oz (3.345 kg) F Vag-Spont Other, None  LIV     Birth Comments: none  1 Term 11/03/13    F Vag-Spont  N LIV     Any issues with any prior pregnancies: no Prior children are healthy, doing well, and without any problems or issues: yes History of pap smears: No History of STIs: No   Past Medical History: Past Medical History:  Diagnosis Date  . H/O malaria 2015   [place]  . Medical history non-contributory     Past Surgical History: Past Surgical History:  Procedure Laterality Date  . NO PAST SURGERIES      Family History:  History reviewed. No pertinent family history. She denies any  female cancers, bleeding or blood clotting disorders.  She denies any history of mental retardation, birth defects or genetic disorders in her or the FOB's history  Social History:  Social History   Social History  . Marital status: Married    Spouse name: N/A  . Number of children: N/A  . Years of education: N/A   Occupational History  . Not on file.   Social History Main Topics  . Smoking status: Never Smoker  . Smokeless tobacco: Never Used  . Alcohol use No  . Drug use: No  . Sexual activity: Yes    Birth control/ protection: None   Other Topics Concern  . Not on file   Social History Narrative  . No narrative on file    Allergy: No Known Allergies   Current Outpatient Medications: PNV  Physical Exam:   BP 124/61   Pulse 88   Wt 158 lb 6.4 oz (71.8 kg)   LMP 08/19/2016   BMI 30.43 kg/m  Body mass index is 30.43 kg/m. Contractions: Not present Vag. Bleeding: None. Fundal height: n/a FHTs: 160s  General appearance: Well nourished, well developed female in no acute distress.  Neck:  Supple, normal appearance, and no thyromegaly  Cardiovascular: S1, S2 normal, no murmur, rub or gallop, regular rate and rhythm Respiratory:  Clear to auscultation bilateral. Normal  respiratory effort Abdomen: positive bowel sounds and no masses, hernias; diffusely non tender to palpation, non distended Breasts: breasts appear normal, no suspicious masses, no skin or nipple changes or axillary nodes, and normal palpation. Neuro/Psych:  Normal mood and affect.  Skin:  Warm and dry.  Lymphatic:  No inguinal lymphadenopathy.   Pelvic exam: is not limited by body habitus EGBUS: within normal limits, Vagina: within normal limits and with no blood in the vault, with white cottage cheese like d/c in vault. Cervix: normal appearing cervix without discharge or lesions, closed/long/high, Uterus:  enlarged, c/w 14-16 week size, and Adnexa:  normal adnexa and no mass, fullness,  tenderness  Laboratory: None  Imaging:  None  Assessment: Patient doing well  Plan: 1. Encounter for supervision of low-risk pregnancy, antepartum Routine care. Patient amenable to genetic screening. Negative hemoglobinopathy last pregnancy. Offer quad screen once dates are confirmed with u/s. Confirm dates after 1st u/s - Cytology - PAP - GC/Chlamydia probe amp ([facility] - US MFM OB COMP + 14 WK; Future - Obstetric Panel, Including HIV - Hemoglobin A1c - Comprehensive metabolic panel - Protein / creatinine ratio, urine - Cystic fibrosis gene test - SMN1 Copy Number Analysis - Culture, OB Urine  2. Language barrier affecting health care Interpreter used.  - Cytology - PAP  3. Vulvovaginal candidiasis Monistat 7  4. Short interval between pregnancies affecting pregnancy, antepartum Normal cervix today. f/u CL at anatomy u/s  5. Obesity in pregnancy Baseline pre-x labs, a1c today.   Problem list reviewed and updated.  Follow up in 3 weeks.   Cornelia Copaharlie Lion Fernandez, Jr. MD Attending [facility](Faculty Practice)

## 2016-12-08 NOTE — Progress Notes (Signed)
Here for Initial prenatal visit. Sure of LMP 08/19/16. Declines flu shot. Given prenatal education packets . Patient affect flat at times. Unable to offer [employer] app since she speaks Swahili.

## 2016-12-09 LAB — COMPREHENSIVE METABOLIC PANEL
ALBUMIN: 4.2 g/dL (ref 3.5–5.5)
ALT: 15 IU/L (ref 0–32)
AST: 20 IU/L (ref 0–40)
Albumin/Globulin Ratio: 1.2 (ref 1.2–2.2)
Alkaline Phosphatase: 69 IU/L (ref 39–117)
BUN / CREAT RATIO: 10 (ref 9–23)
BUN: 5 mg/dL — ABNORMAL LOW (ref 6–20)
Bilirubin Total: 0.2 mg/dL (ref 0.0–1.2)
CALCIUM: 9.2 mg/dL (ref 8.7–10.2)
CO2: 23 mmol/L (ref 18–29)
CREATININE: 0.51 mg/dL — AB (ref 0.57–1.00)
Chloride: 98 mmol/L (ref 96–106)
GFR, EST AFRICAN AMERICAN: 158 mL/min/{1.73_m2} (ref >59)
GFR, EST NON AFRICAN AMERICAN: 137 mL/min/{1.73_m2} (ref >59)
GLOBULIN, TOTAL: 3.4 g/dL (ref 1.5–4.5)
Glucose: 61 mg/dL — ABNORMAL LOW (ref 65–99)
Potassium: 3.8 mmol/L (ref 3.5–5.2)
SODIUM: 137 mmol/L (ref 134–144)
TOTAL PROTEIN: 7.6 g/dL (ref 6.0–8.5)

## 2016-12-09 LAB — PROTEIN / CREATININE RATIO, URINE
CREATININE, UR: 34.8 mg/dL
PROTEIN/CREAT RATIO: 115 mg/g{creat} (ref 0–200)
Protein, Ur: 4 mg/dL

## 2016-12-09 LAB — OBSTETRIC PANEL, INCLUDING HIV
Antibody Screen: NEGATIVE
BASOS ABS: 0 10*3/uL (ref 0.0–0.2)
Basos: 0 %
EOS (ABSOLUTE): 0.3 10*3/uL (ref 0.0–0.4)
Eos: 5 %
HEP B S AG: NEGATIVE
HIV Screen 4th Generation wRfx: NONREACTIVE
Hematocrit: 39.1 % (ref 34.0–46.6)
Hemoglobin: 12.4 g/dL (ref 11.1–15.9)
Immature Grans (Abs): 0 10*3/uL (ref 0.0–0.1)
Immature Granulocytes: 0 %
Lymphocytes Absolute: 1.6 10*3/uL (ref 0.7–3.1)
Lymphs: 27 %
MCH: 29.5 pg (ref 26.6–33.0)
MCHC: 31.7 g/dL (ref 31.5–35.7)
MCV: 93 fL (ref 79–97)
MONOCYTES: 7 %
Monocytes Absolute: 0.4 10*3/uL (ref 0.1–0.9)
NEUTROS PCT: 61 %
Neutrophils Absolute: 3.6 10*3/uL (ref 1.4–7.0)
PLATELETS: 186 10*3/uL (ref 150–379)
RBC: 4.2 x10E6/uL (ref 3.77–5.28)
RDW: 13 % (ref 12.3–15.4)
RH TYPE: POSITIVE
RPR: NONREACTIVE
RUBELLA: 23.7 {index} (ref >0.99)
WBC: 5.9 10*3/uL (ref 3.4–10.8)

## 2016-12-09 LAB — GC/CHLAMYDIA PROBE AMP (~~LOC~~) NOT AT ARMC
CHLAMYDIA, DNA PROBE: NEGATIVE
Neisseria Gonorrhea: NEGATIVE

## 2016-12-09 LAB — HGB A1C W/O EAG: HEMOGLOBIN A1C: 5 % (ref 4.8–5.6)

## 2016-12-10 LAB — CYTOLOGY - PAP
Diagnosis: UNDETERMINED — AB
HPV (WINDOPATH): NOT DETECTED

## 2016-12-10 LAB — CULTURE, OB URINE

## 2016-12-10 LAB — URINE CULTURE, OB REFLEX

## 2016-12-15 LAB — CYSTIC FIBROSIS GENE TEST

## 2016-12-16 LAB — SMN1 COPY NUMBER ANALYSIS (SMA CARRIER SCREENING)

## 2016-12-24 ENCOUNTER — Ambulatory Visit (HOSPITAL_COMMUNITY): Payer: Medicaid Other | Attending: Obstetrics and Gynecology

## 2016-12-29 ENCOUNTER — Encounter: Payer: Self-pay | Admitting: Advanced Practice Midwife

## 2017-01-13 ENCOUNTER — Encounter: Payer: Self-pay | Admitting: General Practice

## 2017-01-21 ENCOUNTER — Ambulatory Visit: Payer: Medicaid Other | Admitting: Family Medicine

## 2017-01-27 ENCOUNTER — Ambulatory Visit (INDEPENDENT_AMBULATORY_CARE_PROVIDER_SITE_OTHER): Payer: Medicaid Other | Admitting: Medical

## 2017-01-27 VITALS — BP 119/47 | HR 76 | Wt 161.0 lb

## 2017-01-27 DIAGNOSIS — Z3490 Encounter for supervision of normal pregnancy, unspecified, unspecified trimester: Secondary | ICD-10-CM

## 2017-01-27 DIAGNOSIS — Z3482 Encounter for supervision of other normal pregnancy, second trimester: Secondary | ICD-10-CM

## 2017-01-27 NOTE — Progress Notes (Signed)
Video Interpreter # [id number]

## 2017-01-27 NOTE — Patient Instructions (Addendum)
Second Trimester of Pregnancy The second trimester is from week 13 through week 28, month 4 through 6. This is often the time in pregnancy that you feel your best. Often times, morning sickness has lessened or quit. You may have more energy, and you may get hungry more often. Your unborn baby (fetus) is growing rapidly. At the end of the sixth month, he or she is about 9 inches long and weighs about 1 pounds. You will likely feel the baby move (quickening) between 18 and 20 weeks of pregnancy. Follow these instructions at home:  Avoid all smoking, herbs, and alcohol. Avoid drugs not approved by your doctor.  Do not use any tobacco products, including cigarettes, chewing tobacco, and electronic cigarettes. If you need help quitting, ask your doctor. You may get counseling or other support to help you quit.  Only take medicine as told by your doctor. Some medicines are safe and some are not during pregnancy.  Exercise only as told by your doctor. Stop exercising if you start having cramps.  Eat regular, healthy meals.  Wear a good support bra if your breasts are tender.  Do not use hot tubs, steam rooms, or saunas.  Wear your seat belt when driving.  Avoid raw meat, uncooked cheese, and liter boxes and soil used by cats.  Take your prenatal vitamins.  Take 1500-2000 milligrams of calcium daily starting at the 20th week of pregnancy until you deliver your baby.  Try taking medicine that helps you poop (stool softener) as needed, and if your doctor approves. Eat more fiber by eating fresh fruit, vegetables, and whole grains. Drink enough fluids to keep your pee (urine) clear or pale yellow.  Take warm water baths (sitz baths) to soothe pain or discomfort caused by hemorrhoids. Use hemorrhoid cream if your doctor approves.  If you have puffy, bulging veins (varicose veins), wear support hose. Raise (elevate) your feet for 15 minutes, 3-4 times a day. Limit salt in your diet.  Avoid heavy  lifting, wear low heals, and sit up straight.  Rest with your legs raised if you have leg cramps or low back pain.  Visit your dentist if you have not gone during your pregnancy. Use a soft toothbrush to brush your teeth. Be gentle when you floss.  You can have sex (intercourse) unless your doctor tells you not to.  Go to your doctor visits. Get help if:  You feel dizzy.  You have mild cramps or pressure in your lower belly (abdomen).  You have a nagging pain in your belly area.  You continue to feel sick to your stomach (nauseous), throw up (vomit), or have watery poop (diarrhea).  You have bad smelling fluid coming from your vagina.  You have pain with peeing (urination). Get help right away if:  You have a fever.  You are leaking fluid from your vagina.  You have spotting or bleeding from your vagina.  You have severe belly cramping or pain.  You lose or gain weight rapidly.  You have trouble catching your breath and have chest pain.  You notice sudden or extreme puffiness (swelling) of your face, hands, ankles, feet, or legs.  You have not felt the baby move in over an hour.  You have severe headaches that do not go away with medicine.  You have vision changes. This information is not intended to replace advice given to you by your health care provider. Make sure you discuss any questions you have with your health care   provider. Document Released: 10/13/2009 Document Revised: 12/25/2015 Document Reviewed: 09/19/2012 Elsevier Interactive Patient Education  2017 Elsevier Inc.   Contraception Choices Contraception, also called birth control, means things to use or ways to try not to get pregnant. Hormonal birth control This kind of birth control uses hormones. Here are some types of hormonal birth control:  A tube that is put under skin of the arm (implant). The tube can stay in for as long as 3 years.  Shots to get every 3 months (injections).  Pills to  take every day (birth control pills).  A patch to change 1 time each week for 3 weeks (birth control patch). After that, the patch is taken off for 1 week.  A ring to put in the vagina. The ring is left in for 3 weeks. Then it is taken out of the vagina for 1 week. Then a new ring is put in.  Pills to take after unprotected sex (emergency birth control pills).  Barrier birth control Here are some types of barrier birth control:  A thin covering that is put on the penis before sex (female condom). The covering is thrown away after sex.  A soft, loose covering that is put in the vagina before sex (female condom). The covering is thrown away after sex.  A rubber bowl that sits over the cervix (diaphragm). The bowl must be made for you. The bowl is put into the vagina before sex. The bowl is left in for 6-8 hours after sex. It is taken out within 24 hours.  A small, soft cup that fits over the cervix (cervical cap). The cup must be made for you. The cup can be left in for 6-8 hours after sex. It is taken out within 48 hours.  A sponge that is put into the vagina before sex. It must be left in for at least 6 hours after sex. It must be taken out within 30 hours. Then it is thrown away.  A chemical that kills or stops sperm from getting into the uterus (spermicide). It may be a pill, cream, jelly, or foam to put in the vagina. The chemical should be used at least 10-15 minutes before sex.  IUD (intrauterine) birth control An IUD is a small, T-shaped piece of plastic. It is put inside the uterus. There are two kinds:  Hormone IUD. This kind can stay in for 3-5 years.  Copper IUD. This kind can stay in for 10 years.  Permanent birth control Here are some types of permanent birth control:  Surgery to block the fallopian tubes.  Having an insert put into each fallopian tube.  Surgery to tie off the tubes that carry sperm (vasectomy).  Natural planning birth control Here are some types of  natural planning birth control:  Not having sex on the days the woman could get pregnant.  Using a calendar: ? To keep track of the length of each period. ? To find out what days pregnancy can happen. ? To plan to not have sex on days when pregnancy can happen.  Watching for symptoms of ovulation and not having sex during ovulation. One way the woman can check for ovulation is to check her temperature.  Waiting to have sex until after ovulation.  Summary  Contraception, also called birth control, means things to use or ways to try not to get pregnant.  Hormonal methods of birth control include implants, injections, pills, patches, vaginal rings, and emergency birth control pills.  Barrier methods   of birth control can include female condoms, female condoms, diaphragms, cervical caps, sponges, and spermicides.  There are two types of IUD (intrauterine device) birth control. An IUD can be put in a woman's uterus to prevent pregnancy for 3-5 years.  Permanent sterilization can be done through a procedure for males, females, or both.  Natural planning methods involve not having sex on the days when the woman could get pregnant. This information is not intended to replace advice given to you by your health care provider. Make sure you discuss any questions you have with your health care provider. Document Released: 05/16/2009 Document Revised: 07/29/2016 Document Reviewed: 07/29/2016 Elsevier Interactive Patient Education  2017 Elsevier Inc.  

## 2017-01-27 NOTE — Progress Notes (Signed)
   PRENATAL VISIT NOTE  Subjective:  Sandra Sexton is a 21 y.o. G3P2002 at 3981w0d being seen today for ongoing prenatal care.  She is currently monitored for the following issues for this high-risk pregnancy and has Language barrier affecting health care; Supervision of low-risk pregnancy; Short interval between pregnancies affecting pregnancy, antepartum; Obesity (BMI 30.0-34.9); Obesity in pregnancy; and Late prenatal care on her problem list.  Patient reports no complaints.  Contractions: Not present. Vag. Bleeding: None.  Movement: Present. Denies leaking of fluid.   The following portions of the patient's history were reviewed and updated as appropriate: allergies, current medications, past family history, past medical history, past social history, past surgical history and problem list. Problem list updated.  Objective:   Vitals:   01/27/17 0927  BP: (!) 119/47  Pulse: 76  Weight: 161 lb (73 kg)    Fetal Status: Fetal Heart Rate (bpm): 161 Fundal Height: 23 cm Movement: Present     General:  Alert, oriented and cooperative. Patient is in no acute distress.  Skin: Skin is warm and dry. No rash noted.   Cardiovascular: Normal heart rate noted  Respiratory: Normal respiratory effort, no problems with respiration noted  Abdomen: Soft, gravid, appropriate for gestational age. Pain/Pressure: Present     Pelvic:  Cervical exam deferred        Extremities: Normal range of motion.  Edema: None  Mental Status: Normal mood and affect. Normal behavior. Normal judgment and thought content.   Assessment and Plan:  Pregnancy: G3P2002 at 3081w0d  1. Encounter for supervision of low-risk pregnancy, antepartum - Doing well  - 2 hour GTT and labs at next visit  - Missed US appointment for anatomy, will reschedule for next available  Preterm labor symptoms and general obstetric precautions including but not limited to vaginal bleeding, contractions, leaking of fluid and fetal movement were  reviewed in detail with the patient. Please refer to After Visit Summary for other counseling recommendations.  Return in about 4 weeks (around 02/24/2017) for LOB, fasting GTT and labs.   Vonzella NippleJulie Wenzel, PA-C

## 2017-02-04 ENCOUNTER — Ambulatory Visit (HOSPITAL_COMMUNITY)
Admission: RE | Admit: 2017-02-04 | Discharge: 2017-02-04 | Disposition: A | Discharge status: Home / Self Care | Payer: Medicaid Other | Source: Ambulatory Visit | Attending: Obstetrics and Gynecology | Admitting: Obstetrics and Gynecology

## 2017-02-04 ENCOUNTER — Other Ambulatory Visit: Payer: Self-pay | Admitting: Obstetrics and Gynecology

## 2017-02-04 DIAGNOSIS — O0932 Supervision of pregnancy with insufficient antenatal care, second trimester: Secondary | ICD-10-CM

## 2017-02-04 DIAGNOSIS — Z789 Other specified health status: Secondary | ICD-10-CM

## 2017-02-04 DIAGNOSIS — Z3A23 23 weeks gestation of pregnancy: Secondary | ICD-10-CM

## 2017-02-04 DIAGNOSIS — Z3689 Encounter for other specified antenatal screening: Secondary | ICD-10-CM | POA: Insufficient documentation

## 2017-02-04 DIAGNOSIS — O99212 Obesity complicating pregnancy, second trimester: Secondary | ICD-10-CM

## 2017-02-04 DIAGNOSIS — O09892 Supervision of other high risk pregnancies, second trimester: Secondary | ICD-10-CM

## 2017-02-04 DIAGNOSIS — Z3490 Encounter for supervision of normal pregnancy, unspecified, unspecified trimester: Secondary | ICD-10-CM

## 2017-02-23 ENCOUNTER — Encounter: Payer: Self-pay | Admitting: Family Medicine

## 2017-02-23 ENCOUNTER — Encounter: Payer: Medicaid Other | Admitting: Family Medicine

## 2017-03-02 ENCOUNTER — Encounter: Payer: Self-pay | Admitting: General Practice

## 2017-03-02 ENCOUNTER — Encounter: Payer: Medicaid Other | Admitting: Advanced Practice Midwife

## 2017-03-15 ENCOUNTER — Ambulatory Visit (INDEPENDENT_AMBULATORY_CARE_PROVIDER_SITE_OTHER): Payer: Medicaid Other | Admitting: Obstetrics and Gynecology

## 2017-03-15 VITALS — BP 102/56 | HR 84 | Wt 164.0 lb

## 2017-03-15 DIAGNOSIS — Z3403 Encounter for supervision of normal first pregnancy, third trimester: Secondary | ICD-10-CM | POA: Diagnosis present

## 2017-03-15 DIAGNOSIS — Z3490 Encounter for supervision of normal pregnancy, unspecified, unspecified trimester: Secondary | ICD-10-CM

## 2017-03-15 DIAGNOSIS — Z23 Encounter for immunization: Secondary | ICD-10-CM

## 2017-03-15 NOTE — Patient Instructions (Signed)

## 2017-03-15 NOTE — Progress Notes (Signed)
[employer] interpreter Sandra Sexton [phone]    PRENATAL VISIT NOTE  Subjective:  Sandra Sexton is a 21 y.o. G3P2002 at 3961w5d being seen today for ongoing prenatal care.  She is currently monitored for the following issues for this low-risk pregnancy and has Language barrier affecting health care; Supervision of low-risk pregnancy; Short interval between pregnancies affecting pregnancy, antepartum; Obesity (BMI 30.0-34.9); Obesity in pregnancy; and Late prenatal care on her problem list.  Patient reports no complaints.  Contractions: Not present. Vag. Bleeding: None.  Movement: Present. Denies leaking of fluid.   The following portions of the patient's history were reviewed and updated as appropriate: allergies, current medications, past family history, past medical history, past social history, past surgical history and problem list. Problem list updated.  Objective:   Vitals:   03/15/17 0844  BP: (!) 102/56  Pulse: 84  Weight: 164 lb (74.4 kg)    Fetal Status:     Movement: Present     General:  Alert, oriented and cooperative. Patient is in no acute distress.  Skin: Skin is warm and dry. No rash noted.   Cardiovascular: Normal heart rate noted  Respiratory: Normal respiratory effort, no problems with respiration noted  Abdomen: Soft, gravid, appropriate for gestational age.  Pain/Pressure: Absent     Pelvic: Cervical exam deferred        Extremities: Normal range of motion.  Edema: None  Mental Status:  Normal mood and affect. Normal behavior. Normal judgment and thought content.   Assessment and Plan:  Pregnancy: G3P2002 at 7261w5d  1. Supervision of low-risk first pregnancy, third trimester  - Glucose tolerance, 2 hours - RPR - HIV antibody - Tdap vaccine greater than or equal to 7yo IM - CBC - US MFM OB FOLLOW UP; Future  *Certain LMP per patient. 7 days discrepancy between US done at 23 weeks and LMP. Therefore will stay with LMP dating.    Preterm labor symptoms and  general obstetric precautions including but not limited to vaginal bleeding, contractions, leaking of fluid and fetal movement were reviewed in detail with the patient. Please refer to After Visit Summary for other counseling recommendations.  Return in about 2 weeks (around 03/29/2017).   Sandra Sexton, Sandra RutherfordJennifer I, NP

## 2017-03-16 LAB — CBC
HEMOGLOBIN: 10.3 g/dL — AB (ref 11.1–15.9)
Hematocrit: 31.3 % — ABNORMAL LOW (ref 34.0–46.6)
MCH: 30.4 pg (ref 26.6–33.0)
MCHC: 32.9 g/dL (ref 31.5–35.7)
MCV: 92 fL (ref 79–97)
Platelets: 130 10*3/uL — ABNORMAL LOW (ref 150–379)
RBC: 3.39 x10E6/uL — ABNORMAL LOW (ref 3.77–5.28)
RDW: 12.9 % (ref 12.3–15.4)
WBC: 4.5 10*3/uL (ref 3.4–10.8)

## 2017-03-16 LAB — GLUCOSE TOLERANCE, 2 HOURS W/ 1HR
GLUCOSE, 2 HOUR: 51 mg/dL — AB (ref 65–152)
Glucose, 1 hour: 63 mg/dL — ABNORMAL LOW (ref 65–179)
Glucose, Fasting: 70 mg/dL (ref 65–91)

## 2017-03-16 LAB — RPR: RPR: NONREACTIVE

## 2017-03-16 LAB — HIV ANTIBODY (ROUTINE TESTING W REFLEX): HIV SCREEN 4TH GENERATION: NONREACTIVE

## 2017-03-29 ENCOUNTER — Ambulatory Visit (INDEPENDENT_AMBULATORY_CARE_PROVIDER_SITE_OTHER): Payer: Medicaid Other | Admitting: Certified Nurse Midwife

## 2017-03-29 ENCOUNTER — Encounter: Payer: Self-pay | Admitting: Certified Nurse Midwife

## 2017-03-29 ENCOUNTER — Ambulatory Visit (HOSPITAL_COMMUNITY)
Admission: RE | Admit: 2017-03-29 | Discharge: 2017-03-29 | Disposition: A | Discharge status: Home / Self Care | Payer: Medicaid Other | Source: Ambulatory Visit | Attending: Obstetrics and Gynecology | Admitting: Obstetrics and Gynecology

## 2017-03-29 VITALS — BP 109/53 | HR 83 | Wt 164.2 lb

## 2017-03-29 DIAGNOSIS — O2613 Low weight gain in pregnancy, third trimester: Secondary | ICD-10-CM

## 2017-03-29 DIAGNOSIS — Z3403 Encounter for supervision of normal first pregnancy, third trimester: Secondary | ICD-10-CM | POA: Insufficient documentation

## 2017-03-29 DIAGNOSIS — Z3493 Encounter for supervision of normal pregnancy, unspecified, third trimester: Secondary | ICD-10-CM

## 2017-03-29 DIAGNOSIS — Z3A30 30 weeks gestation of pregnancy: Secondary | ICD-10-CM | POA: Diagnosis not present

## 2017-03-29 DIAGNOSIS — O2610 Low weight gain in pregnancy, unspecified trimester: Secondary | ICD-10-CM | POA: Insufficient documentation

## 2017-03-29 DIAGNOSIS — Z789 Other specified health status: Secondary | ICD-10-CM

## 2017-03-29 NOTE — Progress Notes (Signed)
Swahili interpreter Greggory Stallion [phone] used

## 2017-03-29 NOTE — Progress Notes (Signed)
Subjective:  Sandra Sexton is a 21 y.o. G3P2002 at [redacted]w[redacted]d being seen today for ongoing prenatal care.  She is currently monitored for the following issues for this low-risk pregnancy and has Language barrier affecting health care; Supervision of low-risk pregnancy; Short interval between pregnancies affecting pregnancy, antepartum; Obesity (BMI 30.0-34.9); Obesity in pregnancy; and Late prenatal care on her problem list.  Patient reports no complaints.  Contractions: Not present. Vag. Bleeding: None.  Movement: Present. Denies leaking of fluid.   The following portions of the patient's history were reviewed and updated as appropriate: allergies, current medications, past family history, past medical history, past social history, past surgical history and problem list. Problem list updated.  Objective:   Vitals:   03/29/17 1000  BP: (!) 109/53  Pulse: 83  Weight: 164 lb 3.2 oz (74.5 kg)    Fetal Status: Fetal Heart Rate (bpm): 140 Fundal Height: 29 cm Movement: Present  Presentation: Undeterminable  General:  Alert, oriented and cooperative. Patient is in no acute distress.  Skin: Skin is warm and dry. No rash noted.   Cardiovascular: Normal heart rate noted  Respiratory: Normal respiratory effort, no problems with respiration noted  Abdomen: Soft, gravid, appropriate for gestational age. Pain/Pressure: Absent     Pelvic: Vag. Bleeding: None     Cervical exam deferred        Extremities: Normal range of motion.  Edema: None  Mental Status: Normal mood and affect. Normal behavior. Normal judgment and thought content.   Urinalysis:      Assessment and Plan:  Pregnancy: G3P2002 at [redacted]w[redacted]d  1. Encounter for supervision of low-risk pregnancy in third trimester - [place] for completion of anatomy today  2. Language barrier affecting health care - video interpreter present for visit  Preterm labor symptoms and general obstetric precautions including but not limited to vaginal bleeding,  contractions, leaking of fluid and fetal movement were reviewed in detail with the patient. Please refer to After Visit Summary for other counseling recommendations.  Return in about 2 weeks (around 04/12/2017).   Donette Larry, CNM

## 2017-04-12 ENCOUNTER — Encounter: Payer: Self-pay | Admitting: Obstetrics and Gynecology

## 2017-04-12 ENCOUNTER — Ambulatory Visit (INDEPENDENT_AMBULATORY_CARE_PROVIDER_SITE_OTHER): Payer: Self-pay | Admitting: Obstetrics and Gynecology

## 2017-04-12 VITALS — BP 105/53 | HR 76

## 2017-04-12 DIAGNOSIS — Z789 Other specified health status: Secondary | ICD-10-CM

## 2017-04-12 DIAGNOSIS — Z3493 Encounter for supervision of normal pregnancy, unspecified, third trimester: Secondary | ICD-10-CM

## 2017-04-12 NOTE — Progress Notes (Signed)
   PRENATAL VISIT NOTE  Subjective:  Sandra Sexton is a 21 y.o. G4P2002 at 1090w1d being seen today for ongoing prenatal care.  She is currently monitored for the following issues for this low-risk pregnancy and has Language barrier affecting health care; Supervision of low-risk pregnancy; Short interval between pregnancies affecting pregnancy, antepartum; Obesity (BMI 30.0-34.9); Obesity in pregnancy; and Late prenatal care on her problem list.  Patient reports no complaints.  Contractions: Not present. Vag. Bleeding: None.  Movement: Present. Denies leaking of fluid.   The following portions of the patient's history were reviewed and updated as appropriate: allergies, current medications, past family history, past medical history, past social history, past surgical history and problem list. Problem list updated.  Objective:   Vitals:   04/12/17 1616  BP: (!) 105/53  Pulse: 76    Fetal Status: Fetal Heart Rate (bpm): 150   Movement: Present     General:  Alert, oriented and cooperative. Patient is in no acute distress.  Skin: Skin is warm and dry. No rash noted.   Cardiovascular: Normal heart rate noted  Respiratory: Normal respiratory effort, no problems with respiration noted  Abdomen: Soft, gravid, appropriate for gestational age.  Pain/Pressure: Absent     Pelvic: Cervical exam deferred        Extremities: Normal range of motion.  Edema: None  Mental Status:  Normal mood and affect. Normal behavior. Normal judgment and thought content.   Assessment and Plan:  Pregnancy: G4P2002 at 5290w1d  1. Language barrier affecting health care Interpretor used   2. Encounter for supervision of low-risk pregnancy in third trimester  Reviewed previous labs.   Preterm labor symptoms and general obstetric precautions including but not limited to vaginal bleeding, contractions, leaking of fluid and fetal movement were reviewed in detail with the patient. Please refer to After Visit Summary  for other counseling recommendations.  Return in about 2 weeks (around 04/26/2017).   Venia CarbonJennifer Tiffney Haughton, NP

## 2017-04-12 NOTE — Patient Instructions (Signed)
Contraception Choices Contraception (birth control) is the use of any methods or devices to prevent pregnancy. Below are some methods to help avoid pregnancy. Hormonal methods  Contraceptive implant. This is a thin, plastic tube containing progesterone hormone. It does not contain estrogen hormone. Your health care provider inserts the tube in the inner part of the upper arm. The tube can remain in place for up to 3 years. After 3 years, the implant must be removed. The implant prevents the ovaries from releasing an egg (ovulation), thickens the cervical mucus to prevent sperm from entering the uterus, and thins the lining of the inside of the uterus.  Progesterone-only injections. These injections are given every 3 months by your health care provider to prevent pregnancy. This synthetic progesterone hormone stops the ovaries from releasing eggs. It also thickens cervical mucus and changes the uterine lining. This makes it harder for sperm to survive in the uterus.  Birth control pills. These pills contain estrogen and progesterone hormone. They work by preventing the ovaries from releasing eggs (ovulation). They also cause the cervical mucus to thicken, preventing the sperm from entering the uterus. Birth control pills are prescribed by a health care provider.Birth control pills can also be used to treat heavy periods.  Minipill. This type of birth control pill contains only the progesterone hormone. They are taken every day of each month and must be prescribed by your health care provider.  Birth control patch. The patch contains hormones similar to those in birth control pills. It must be changed once a week and is prescribed by a health care provider.  Vaginal ring. The ring contains hormones similar to those in birth control pills. It is left in the vagina for 3 weeks, removed for 1 week, and then a new one is put back in place. The patient must be comfortable inserting and removing the ring from  the vagina.A health care provider's prescription is necessary.  Emergency contraception. Emergency contraceptives prevent pregnancy after unprotected sexual intercourse. This pill can be taken right after sex or up to 5 days after unprotected sex. It is most effective the sooner you take the pills after having sexual intercourse. Most emergency contraceptive pills are available without a prescription. Check with your pharmacist. Do not use emergency contraception as your only form of birth control. Barrier methods  Female condom. This is a thin sheath (latex or rubber) that is worn over the penis during sexual intercourse. It can be used with spermicide to increase effectiveness.  Female condom. This is a soft, loose-fitting sheath that is put into the vagina before sexual intercourse.  Diaphragm. This is a soft, latex, dome-shaped barrier that must be fitted by a health care provider. It is inserted into the vagina, along with a spermicidal jelly. It is inserted before intercourse. The diaphragm should be left in the vagina for 6 to 8 hours after intercourse.  Cervical cap. This is a round, soft, latex or plastic cup that fits over the cervix and must be fitted by a health care provider. The cap can be left in place for up to 48 hours after intercourse.  Sponge. This is a soft, circular piece of polyurethane foam. The sponge has spermicide in it. It is inserted into the vagina after wetting it and before sexual intercourse.  Spermicides. These are chemicals that kill or block sperm from entering the cervix and uterus. They come in the form of creams, jellies, suppositories, foam, or tablets. They do not require a prescription. They   are inserted into the vagina with an applicator before having sexual intercourse. The process must be repeated every time you have sexual intercourse. Intrauterine contraception  Intrauterine device (IUD). This is a T-shaped device that is put in a woman's uterus during  a menstrual period to prevent pregnancy. There are 2 types: ? Copper IUD. This type of IUD is wrapped in copper wire and is placed inside the uterus. Copper makes the uterus and fallopian tubes produce a fluid that kills sperm. It can stay in place for 10 years. ? Hormone IUD. This type of IUD contains the hormone progestin (synthetic progesterone). The hormone thickens the cervical mucus and prevents sperm from entering the uterus, and it also thins the uterine lining to prevent implantation of a fertilized egg. The hormone can weaken or kill the sperm that get into the uterus. It can stay in place for 3-5 years, depending on which type of IUD is used. Permanent methods of contraception  Female tubal ligation. This is when the woman's fallopian tubes are surgically sealed, tied, or blocked to prevent the egg from traveling to the uterus.  Hysteroscopic sterilization. This involves placing a small coil or insert into each fallopian tube. Your doctor uses a technique called hysteroscopy to do the procedure. The device causes scar tissue to form. This results in permanent blockage of the fallopian tubes, so the sperm cannot fertilize the egg. It takes about 3 months after the procedure for the tubes to become blocked. You must use another form of birth control for these 3 months.  Female sterilization. This is when the female has the tubes that carry sperm tied off (vasectomy).This blocks sperm from entering the vagina during sexual intercourse. After the procedure, the man can still ejaculate fluid (semen). Natural planning methods  Natural family planning. This is not having sexual intercourse or using a barrier method (condom, diaphragm, cervical cap) on days the woman could become pregnant.  Calendar method. This is keeping track of the length of each menstrual cycle and identifying when you are fertile.  Ovulation method. This is avoiding sexual intercourse during ovulation.  Symptothermal method.  This is avoiding sexual intercourse during ovulation, using a thermometer and ovulation symptoms.  Post-ovulation method. This is timing sexual intercourse after you have ovulated. Regardless of which type or method of contraception you choose, it is important that you use condoms to protect against the transmission of sexually transmitted infections (STIs). Talk with your health care provider about which form of contraception is most appropriate for you. This information is not intended to replace advice given to you by your health care provider. Make sure you discuss any questions you have with your health care provider. Document Released: 07/19/2005 Document Revised: 12/25/2015 Document Reviewed: 01/11/2013 Elsevier Interactive Patient Education  2017 Elsevier Inc.  

## 2017-04-12 NOTE — Progress Notes (Signed)
[employer] interpreter Imran [id number] 

## 2017-04-26 ENCOUNTER — Ambulatory Visit (INDEPENDENT_AMBULATORY_CARE_PROVIDER_SITE_OTHER): Payer: Medicaid Other | Admitting: Family Medicine

## 2017-04-26 VITALS — BP 101/63 | HR 79 | Wt 152.0 lb

## 2017-04-26 DIAGNOSIS — Z3483 Encounter for supervision of other normal pregnancy, third trimester: Secondary | ICD-10-CM

## 2017-04-26 DIAGNOSIS — E669 Obesity, unspecified: Secondary | ICD-10-CM

## 2017-04-26 DIAGNOSIS — Z3493 Encounter for supervision of normal pregnancy, unspecified, third trimester: Secondary | ICD-10-CM

## 2017-04-26 DIAGNOSIS — O09899 Supervision of other high risk pregnancies, unspecified trimester: Secondary | ICD-10-CM

## 2017-04-26 DIAGNOSIS — O99213 Obesity complicating pregnancy, third trimester: Secondary | ICD-10-CM

## 2017-04-26 DIAGNOSIS — Z789 Other specified health status: Secondary | ICD-10-CM

## 2017-04-26 DIAGNOSIS — O99210 Obesity complicating pregnancy, unspecified trimester: Secondary | ICD-10-CM

## 2017-04-26 MED ORDER — DOCUSATE SODIUM 100 MG PO CAPS: 100.0000 mg | ORAL_CAPSULE | Freq: Every day | ORAL | 1 refills | Status: DC

## 2017-04-26 MED ORDER — FERROUS SULFATE 325 (65 FE) MG PO TABS: 325.0000 mg | ORAL_TABLET | Freq: Every day | ORAL | 3 refills | Status: DC

## 2017-04-26 NOTE — Progress Notes (Signed)
   PRENATAL VISIT NOTE  Subjective:  Sandra Sexton is a 21 y.o. G4P2002 at [redacted]w[redacted]d being seen today for ongoing prenatal care.  She is currently monitored for the following issues for this low-risk pregnancy and has Language barrier affecting health care; Supervision of low-risk pregnancy; Short interval between pregnancies affecting pregnancy, antepartum; Obesity (BMI 30.0-34.9); Obesity in pregnancy; and Late prenatal care on her problem list.  Patient reports no complaints.   .  .  Movement: Present. Denies leaking of fluid.   The following portions of the patient's history were reviewed and updated as appropriate: allergies, current medications, past family history, past medical history, past social history, past surgical history and problem list. Problem list updated.  Objective:   Vitals:   04/26/17 1543  BP: 101/63  Pulse: 79  Weight: 152 lb (68.9 kg)    Fetal Status: Fetal Heart Rate (bpm): 145lbs Fundal Height: 35 cm Movement: Present     General:  Alert, oriented and cooperative. Patient is in no acute distress.  Skin: Skin is warm and dry. No rash noted.   Cardiovascular: Normal heart rate noted  Respiratory: Normal respiratory effort, no problems with respiration noted  Abdomen: Soft, gravid, appropriate for gestational age.  Pain/Pressure: Absent     Pelvic: Cervical exam deferred        Extremities: Normal range of motion.  Edema: None  Mental Status:  Normal mood and affect. Normal behavior. Normal judgment and thought content.   Assessment and Plan:  Pregnancy: G4P2002 at [redacted]w[redacted]d  1. Encounter for supervision of low-risk pregnancy in third trimester - Routine PNC - Cultures next week  2. Obesity in pregnancy - FH appropriate for GA  3. Short interval between pregnancies affecting pregnancy, antepartum  4. Language barrier - Swahili interpreter used via [employer]  Preterm labor symptoms and general obstetric precautions including but not limited to vaginal  bleeding, contractions, leaking of fluid and fetal movement were reviewed in detail with the patient. Please refer to After Visit Summary for other counseling recommendations.  Return in about 1 week (around 05/03/2017) for LOB.   Frederik Pear, MD

## 2017-05-02 ENCOUNTER — Telehealth: Payer: Self-pay | Admitting: Advanced Practice Midwife

## 2017-05-02 ENCOUNTER — Encounter: Payer: Medicaid Other | Admitting: Advanced Practice Midwife

## 2017-05-02 NOTE — Telephone Encounter (Signed)
Called patient about her missed appointment. No voicemail setup. Patient does have another appointment scheduled.

## 2017-05-11 ENCOUNTER — Encounter: Payer: Self-pay | Admitting: Obstetrics & Gynecology

## 2017-05-11 ENCOUNTER — Other Ambulatory Visit (HOSPITAL_COMMUNITY)
Admission: RE | Admit: 2017-05-11 | Discharge: 2017-05-11 | Disposition: A | Discharge status: Home / Self Care | Payer: Medicaid Other | Source: Ambulatory Visit | Attending: Obstetrics & Gynecology | Admitting: Obstetrics & Gynecology

## 2017-05-11 ENCOUNTER — Ambulatory Visit (INDEPENDENT_AMBULATORY_CARE_PROVIDER_SITE_OTHER): Payer: Medicaid Other | Admitting: Obstetrics & Gynecology

## 2017-05-11 VITALS — BP 113/58 | HR 88

## 2017-05-11 DIAGNOSIS — Z3493 Encounter for supervision of normal pregnancy, unspecified, third trimester: Secondary | ICD-10-CM

## 2017-05-11 DIAGNOSIS — O322XX0 Maternal care for transverse and oblique lie, not applicable or unspecified: Secondary | ICD-10-CM

## 2017-05-11 DIAGNOSIS — Z113 Encounter for screening for infections with a predominantly sexual mode of transmission: Secondary | ICD-10-CM | POA: Diagnosis not present

## 2017-05-11 NOTE — Progress Notes (Signed)
   PRENATAL VISIT NOTE  Subjective:  Sandra Sexton is a 21 y.o. G3P2002 at [redacted]w[redacted]d being seen today for ongoing prenatal care.  Due to language barrier, a Swahili interpreter was present during the history-taking and subsequent discussion (and for part of the physical exam) with this patient. She is currently monitored for the following issues for this low-risk pregnancy and has Language barrier affecting health care; Supervision of low-risk pregnancy; Short interval between pregnancies affecting pregnancy, antepartum; Obesity (BMI 30.0-34.9); Obesity in pregnancy; and Late prenatal care on her problem list.  Patient reports mucus discharge.  Contractions: Not present. Vag. Bleeding: None.  Movement: Present. Denies leaking of fluid.   The following portions of the patient's history were reviewed and updated as appropriate: allergies, current medications, past family history, past medical history, past social history, past surgical history and problem list. Problem list updated.  Objective:   Vitals:   05/11/17 1620  BP: (!) 113/58  Pulse: 88    Fetal Status: Fetal Heart Rate (bpm): 161 Fundal Height: 37 cm Movement: Present  Presentation: Transverse on ultrasound (head to maternal right, back up)  General:  Alert, oriented and cooperative. Patient is in no acute distress.  Skin: Skin is warm and dry. No rash noted.   Cardiovascular: Normal heart rate noted  Respiratory: Normal respiratory effort, no problems with respiration noted  Abdomen: Soft, gravid, appropriate for gestational age.  Pain/Pressure: Absent     Pelvic: Cervical exam performed Dilation: Fingertip Effacement (%): Thick Station: Ballotable  Extremities: Normal range of motion.  Edema: None  Mental Status:  Normal mood and affect. Normal behavior. Normal judgment and thought content.   Assessment and Plan:  Pregnancy: G3P2002 at [redacted]w[redacted]d  1. Transverse presentation, antepartum, single or unspecified fetus Discussed  implications of non-cephalic presentation.  ECV vs RCS discussed. She is unsure of what she wants to do, wants to recheck next week then decide.   2. Encounter for supervision of low-risk pregnancy in third trimester Pelvic cultures done today - Strep Gp B NAA - GC/Chlamydia probe amp (Juab)not at [facility] Term labor symptoms and general obstetric precautions including but not limited to vaginal bleeding, contractions, leaking of fluid and fetal movement were reviewed in detail with the patient. Please refer to After Visit Summary for other counseling recommendations.  Return in about 1 week (around 05/18/2017) for Ultrasound for presentation (with Diane), OB Visit (HOB).   Jaynie Collins, MD

## 2017-05-11 NOTE — Progress Notes (Signed)
Patient stated she had bleeding on Saturday , thick mucous x1 occurrence (Mucous plug?) Swahili interpreter Harriett Sine present for visit

## 2017-05-11 NOTE — Patient Instructions (Addendum)
Return to clinic for any scheduled appointments or obstetric concerns, or go to [facility] for evaluation  Breech Birth What is a breech birth? A breech birth is when a baby is born with the buttocks or the feet first. Most babies are in a head down (vertex) position when they are born. There are three types of breech babies:  When the baby's buttocks are showing first in the birth canal (vagina) with the legs straight up and the feet at the baby's head (frank breech).  When the baby's buttocks shows first with the legs bent at the knees and the feet down near the buttocks (complete breech).  When one or both of the baby's feet are down below the buttocks (footling breech).  What are the risks of a breech birth? Having a breech birth increases the risk to your baby. A breech birth may cause the following:  Umbilical cord prolapse. This is when the umbilical cord is in front of the baby before or during labor. This can cause the cord to become pinched or compressed. This can reduce the flow of blood and oxygen to the baby.  The baby getting stuck in the birth canal, which can cause injury or, rarely, death.  Injury to the nerves in the shoulder, arm, and hand (brachial plexus injury) when delivered.  Your baby being born too early (prematurely).  An increased need for a cesarean delivery.  What increases the risk of having a breech baby? It is not known what causes your baby to be breech. However, risk factors that may increase your chances of having a breech baby include the following:  The mother having had several babies already.  The mother having twins or more.  The mother having a baby with certain congenital disabilities.  The mother going into labor early.  The mother having problems with her uterus, such as a tumor.  The mother having placenta problems (placenta previa) or too much or not enough fluid surrounding the baby (amniotic fluid).  How do I know if my baby is  breech? There are no symptoms for you to know that your baby is breech. When you are close to your due date, your health care provider can tell if your baby is breech by:  An abdominal or vaginal (pelvic) exam.  An ultrasound.  Your health care provider may also be able to tell that your baby is breech if your baby's heartbeat is heard above your belly button. What can be done if my baby is breech?  Your health care provider may try to turn the baby in your uterus. This is a procedure called external cephalic version (ECV). This is done by your health care provider. He or she will place both hands on your abdomen and gently and slowly turn the baby around. It is important to know that ECV can increase your chances of suddenly going into labor. If an ECV is done, it is done toward the end of a healthy pregnancy. The baby may remain in this position or he or she may turn back to the breech position. You and your health care provider will discuss if an ECV is recommended for you and your baby. How will I delivery my baby if my baby is breech? You and your health care provider will discuss the best way to deliver your baby. If your baby is breech, it is less likely that a vaginal delivery will be recommended due to the risks. Some breech babies may be delivered  delivered safely without a cesarean, while in other cases health care providers will recommend a cesarean delivery. This information is not intended to replace advice given to you by your health care provider. Make sure you discuss any questions you have with your health care provider. Document Released: 09/09/2006 Document Revised: 07/05/2016 Document Reviewed: 05/23/2014 Elsevier Interactive Patient Education  2017 Elsevier Inc.   

## 2017-05-12 LAB — GC/CHLAMYDIA PROBE AMP (~~LOC~~) NOT AT ARMC
Chlamydia: NEGATIVE
NEISSERIA GONORRHEA: NEGATIVE

## 2017-05-13 LAB — STREP GP B NAA: Strep Gp B NAA: NEGATIVE

## 2017-05-13 LAB — OB RESULTS CONSOLE GBS: STREP GROUP B AG: NEGATIVE

## 2017-05-19 ENCOUNTER — Ambulatory Visit (INDEPENDENT_AMBULATORY_CARE_PROVIDER_SITE_OTHER): Payer: Self-pay | Admitting: Family Medicine

## 2017-05-19 VITALS — BP 83/56 | HR 79 | Wt 171.8 lb

## 2017-05-19 DIAGNOSIS — O99210 Obesity complicating pregnancy, unspecified trimester: Secondary | ICD-10-CM

## 2017-05-19 DIAGNOSIS — Z789 Other specified health status: Secondary | ICD-10-CM

## 2017-05-19 DIAGNOSIS — O322XX0 Maternal care for transverse and oblique lie, not applicable or unspecified: Secondary | ICD-10-CM

## 2017-05-19 DIAGNOSIS — Z603 Acculturation difficulty: Secondary | ICD-10-CM

## 2017-05-19 DIAGNOSIS — Z3493 Encounter for supervision of normal pregnancy, unspecified, third trimester: Secondary | ICD-10-CM

## 2017-05-19 NOTE — Progress Notes (Signed)
C/o dizziness if standing for long period of time

## 2017-05-19 NOTE — Progress Notes (Signed)
   PRENATAL VISIT NOTE  Subjective:  Sandra Sexton is a 21 y.o. G3P2002 at 1056w3d being seen today for ongoing prenatal care.  She is currently monitored for the following issues for this low-risk pregnancy and has Language barrier affecting health care; Supervision of low-risk pregnancy; Short interval between pregnancies affecting pregnancy, antepartum; Obesity (BMI 30.0-34.9); Obesity in pregnancy; and Late prenatal care on her problem list.  Patient reports no complaints.  Contractions: Not present. Vag. Bleeding: None.  Movement: Present. Denies leaking of fluid.   The following portions of the patient's history were reviewed and updated as appropriate: allergies, current medications, past family history, past medical history, past social history, past surgical history and problem list. Problem list updated.  Objective:   Vitals:   05/19/17 1538  BP: (!) 83/56  Pulse: 79  Weight: 171 lb 12.8 oz (77.9 kg)    Fetal Status: Fetal Heart Rate (bpm): 138   Movement: Present  Presentation: Transverse  General:  Alert, oriented and cooperative. Patient is in no acute distress.  Skin: Skin is warm and dry. No rash noted.   Cardiovascular: Normal heart rate noted  Respiratory: Normal respiratory effort, no problems with respiration noted  Abdomen: Soft, gravid, appropriate for gestational age.  Pain/Pressure: Present     Pelvic: Cervical exam deferred        Extremities: Normal range of motion.  Edema: Trace  Mental Status:  Normal mood and affect. Normal behavior. Normal judgment and thought content.   Assessment and Plan:  Pregnancy: G3P2002 at [redacted]w[redacted]d  1. Encounter for supervision of low-risk pregnancy in third trimester FHT and FH normal - CBC  2. Obesity in pregnancy  3. Language barrier affecting health care Interpreter used  4. Transverse position Head maternal left. Version scheduled for tomorrow afternoon per patient's schedule.  Term labor symptoms and general  obstetric precautions including but not limited to vaginal bleeding, contractions, leaking of fluid and fetal movement were reviewed in detail with the patient. Please refer to After Visit Summary for other counseling recommendations.  No Follow-up on file.   Levie HeritageJacob J Stinson, DO

## 2017-05-20 ENCOUNTER — Encounter (HOSPITAL_COMMUNITY): Payer: Self-pay

## 2017-05-20 ENCOUNTER — Observation Stay (HOSPITAL_COMMUNITY)
Admission: RE | Admit: 2017-05-20 | Discharge: 2017-05-20 | Disposition: A | Discharge status: Home / Self Care | Payer: Medicaid Other | Source: Ambulatory Visit | Attending: Obstetrics and Gynecology | Admitting: Obstetrics and Gynecology

## 2017-05-20 DIAGNOSIS — Z3A38 38 weeks gestation of pregnancy: Secondary | ICD-10-CM | POA: Insufficient documentation

## 2017-05-20 DIAGNOSIS — O321XX0 Maternal care for breech presentation, not applicable or unspecified: Secondary | ICD-10-CM | POA: Diagnosis not present

## 2017-05-20 DIAGNOSIS — Z8613 Personal history of malaria: Secondary | ICD-10-CM | POA: Insufficient documentation

## 2017-05-20 LAB — CBC
HCT: 32 % — ABNORMAL LOW (ref 36.0–46.0)
Hemoglobin: 10.3 g/dL — ABNORMAL LOW (ref 12.0–15.0)
MCH: 28.9 pg (ref 26.0–34.0)
MCHC: 32.2 g/dL (ref 30.0–36.0)
MCV: 89.6 fL (ref 78.0–100.0)
PLATELETS: 131 10*3/uL — AB (ref 150–400)
RBC: 3.57 MIL/uL — ABNORMAL LOW (ref 3.87–5.11)
RDW: 13.7 % (ref 11.5–15.5)
WBC: 4.7 10*3/uL (ref 4.0–10.5)

## 2017-05-20 LAB — TYPE AND SCREEN
ABO/RH(D): O POS
Antibody Screen: NEGATIVE

## 2017-05-20 MED ORDER — TERBUTALINE SULFATE 1 MG/ML IJ SOLN
INTRAMUSCULAR | Status: AC
  Administered 2017-05-20: 0.25 mg via SUBCUTANEOUS
  Filled 2017-05-20: qty 1

## 2017-05-20 MED ORDER — LACTATED RINGERS IV SOLN
INTRAVENOUS | Status: DC
  Administered 2017-05-20: 16:00:00 via INTRAVENOUS

## 2017-05-20 NOTE — Procedures (Signed)
Bedside ultrasound showed baby in breech position. After informed verbal consent, Terbutaline 0.25 mg SQ given, ECV was attempted under Ultrasound guidance. Successfully rotated baby into cephalic position. Ultrasound confirmed cephalic position.   FHR was reactive before and after the procedure.   Pt. Tolerated the procedure well. Will monitor baby for at least 30 minutes.  Dr. Jolayne Pantheronstant was present for entire procedure.  Steele Ledonne, DO 05/20/2017, 4:32 PM

## 2017-05-22 NOTE — H&P (Signed)
21 yo [id number] at 2712w4d presented today for scheduled external cephalic version due to fetus being in transverse position. [facility] complicated by short interval between pregnancy and language barrier. Patient reports feeling well. She denies cramping/contraction, leakage of fluid or vaginal bleeding. She reports good fetal movement  OB History    Gravida Para Term Preterm AB Living   3 2 2     2    SAB TAB Ectopic Multiple Live Births         0 2     Past Medical History:  Diagnosis Date  . H/O malaria 2015   [place]   Past Surgical History:  Procedure Laterality Date  . NO PAST SURGERIES     History reviewed. No pertinent family history. Social History  Substance Use Topics  . Smoking status: Never Smoker  . Smokeless tobacco: Never Used  . Alcohol use No   ROS See pertinent in HPI  Blood pressure 104/65, pulse (!) 102, temperature 98.9 F (37.2 C), temperature source Oral, resp. rate 17, height 5\' 3"  (1.6 m), weight 160 lb 15 oz (73 kg), last menstrual period 08/19/2016, unknown if currently breastfeeding.  GENERAL: Well-developed, well-nourished female in no acute distress.  HEENT: Normocephalic, atraumatic. Sclerae anicteric.  NECK: Supple. Normal thyroid.  LUNGS: Clear to auscultation bilaterally.  HEART: Regular rate and rhythm. ABDOMEN: Soft, nontender,gravid PELVIC: Not indicated EXTREMITIES: No cyanosis, clubbing, or edema, 2+ distal pulses.  FHT: baseline 140, mod variability, +accels, no decels Toco: irregular contractions with uterine irritability in between  Bedside sono: Viable fetus in breech presentation  A/P 21 yo G3P2002 at 10612w4d here for ECV - Risks, benefits and alternatives were explained including but not limited to risks of placental abruption, fetal intolerance and need for cesarean delivery. - patient verbalized understanding with the assistance of an interpreter. All questions  were answered - Consent signed

## 2017-05-24 NOTE — Discharge Summary (Signed)
Admission diagnosis: fetal breech presentation, ECV Discharge diagnosis: fetal cephalic presentation   Patient underwent successful ECV.   GENERAL: Well-developed, well-nourished female in no acute distress.  HEENT: Normocephalic, atraumatic. Sclerae anicteric.  NECK: Supple. Normal thyroid.  LUNGS: Clear to auscultation bilaterally.  HEART: Regular rate and rhythm. ABDOMEN: Soft, nontender,gravid PELVIC: Not indicated EXTREMITIES: No cyanosis, clubbing, or edema, 2+ distal pulses.  FHT: baseline 140, mod variability, +accels, no decels Toco: irregular contractions with uterine irritability in between   A/P 21 yo G3P2002 at 605w4d discharged home in stable condition. Post procedure bedside US shows fetal cephalic position. Patient should follow up outpatient for routine prenatal care.  Rolm BookbinderAmber Taggert Bozzi, DO

## 2017-05-30 ENCOUNTER — Inpatient Hospital Stay (HOSPITAL_COMMUNITY): Admission: RE | Admit: 2017-05-30 | Payer: Medicaid Other | Source: Ambulatory Visit

## 2017-06-07 ENCOUNTER — Ambulatory Visit: Payer: Medicaid Other | Admitting: Medical

## 2017-06-07 ENCOUNTER — Telehealth (HOSPITAL_COMMUNITY): Payer: Self-pay | Admitting: *Deleted

## 2017-06-07 ENCOUNTER — Ambulatory Visit (INDEPENDENT_AMBULATORY_CARE_PROVIDER_SITE_OTHER): Payer: Medicaid Other

## 2017-06-07 VITALS — BP 100/65 | HR 84 | Wt 174.0 lb

## 2017-06-07 DIAGNOSIS — O480 Post-term pregnancy: Secondary | ICD-10-CM

## 2017-06-07 DIAGNOSIS — Z3493 Encounter for supervision of normal pregnancy, unspecified, third trimester: Secondary | ICD-10-CM

## 2017-06-07 NOTE — Patient Instructions (Signed)
Labor Induction Labor induction is when steps are taken to cause a pregnant woman to begin the labor process. Most women go into labor on their own between 37 weeks and 42 weeks of the pregnancy. When this does not happen or when there is a medical need, methods may be used to induce labor. Labor induction causes a pregnant woman's uterus to contract. It also causes the cervix to soften (ripen), open (dilate), and thin out (efface). Usually, labor is not induced before 39 weeks of the pregnancy unless there is a problem with the baby or mother. Before inducing labor, your health care provider will consider a number of factors, including the following:  The medical condition of you and the baby.  How many weeks along you are.  The status of the baby's lung maturity.  The condition of the cervix.  The position of the baby. What are the reasons for labor induction? Labor may be induced for the following reasons:  The health of the baby or mother is at risk.  The pregnancy is overdue by 1 week or more.  The water breaks but labor does not start on its own.  The mother has a health condition or serious illness, such as high blood pressure, infection, placental abruption, or diabetes.  The amniotic fluid amounts are low around the baby.  The baby is distressed. Convenience or wanting the baby to be born on a certain date is not a reason for inducing labor. What methods are used for labor induction? Several methods of labor induction may be used, such as:  Prostaglandin medicine. This medicine causes the cervix to dilate and ripen. The medicine will also start contractions. It can be taken by mouth or by inserting a suppository into the vagina.  Inserting a thin tube (catheter) with a balloon on the end into the vagina to dilate the cervix. Once inserted, the balloon is expanded with water, which causes the cervix to open.  Stripping the membranes. Your health care provider separates  amniotic sac tissue from the cervix, causing the cervix to be stretched and causing the release of a hormone called progesterone. This may cause the uterus to contract. It is often done during an office visit. You will be sent home to wait for the contractions to begin. You will then come in for an induction.  Breaking the water. Your health care provider makes a hole in the amniotic sac using a small instrument. Once the amniotic sac breaks, contractions should begin. This may still take hours to see an effect.  Medicine to trigger or strengthen contractions. This medicine is given through an IV access tube inserted into a vein in your arm. All of the methods of induction, besides stripping the membranes, will be done in the hospital. Induction is done in the hospital so that you and the baby can be carefully monitored. How long does it take for labor to be induced? Some inductions can take up to 2-3 days. Depending on the cervix, it usually takes less time. It takes longer when you are induced early in the pregnancy or if this is your first pregnancy. If a mother is still pregnant and the induction has been going on for 2-3 days, either the mother will be sent home or a cesarean delivery will be needed. What are the risks associated with labor induction? Some of the risks of induction include:  Changes in fetal heart rate, such as too high, too low, or erratic.  Fetal distress.    Chance of infection for the mother and baby.  Increased chance of having a cesarean delivery.  Breaking off (abruption) of the placenta from the uterus (rare).  Uterine rupture (very rare). When induction is needed for medical reasons, the benefits of induction may outweigh the risks. What are some reasons for not inducing labor? Labor induction should not be done if:  It is shown that your baby does not tolerate labor.  You have had previous surgeries on your uterus, such as a myomectomy or the removal of  fibroids.  Your placenta lies very low in the uterus and blocks the opening of the cervix (placenta previa).  Your baby is not in a head-down position.  The umbilical cord drops down into the birth canal in front of the baby. This could cut off the baby's blood and oxygen supply.  You have had a previous cesarean delivery.  There are unusual circumstances, such as the baby being extremely premature. This information is not intended to replace advice given to you by your health care provider. Make sure you discuss any questions you have with your health care provider. Document Released: 12/08/2006 Document Revised: 12/25/2015 Document Reviewed: 02/15/2013 Elsevier Interactive Patient Education  2017 Elsevier Inc[employer]  

## 2017-06-07 NOTE — Telephone Encounter (Signed)
Preadmission screen  

## 2017-06-07 NOTE — Progress Notes (Signed)
   PRENATAL VISIT NOTE  Subjective:  Sandra Sexton is a 21 y.o. G3P2002 at 4056w1d being seen today for ongoing prenatal care.  She is currently monitored for the following issues for this low-risk pregnancy and has Language barrier affecting health care; Supervision of low-risk pregnancy; Short interval between pregnancies affecting pregnancy, antepartum; Obesity (BMI 30.0-34.9); Obesity in pregnancy; and Late prenatal care on their problem list.  Due to language barrier, a Swahili interpreter was present during the history-taking and subsequent discussion (and for part of the physical exam) with this patient.  Of note, patient has not been seen since successful version about two weeks ago.  She reports that no follow up appointments were made. Patient reports no complaints.  Contractions: Irregular. Vag. Bleeding: NoneMovement: Present. Denies leaking of fluid.   The following portions of the patient's history were reviewed and updated as appropriate: allergies, current medications, past family history, past medical history, past social history, past surgical history and problem list. Problem list updated.  Objective:   Vitals:   06/07/17 1420  BP: 100/65  Pulse: 84  Weight: 174 lb (78.9 kg)    Fetal Status: Fetal Heart Rate (bpm): NST Fundal Height: 39 cm Movement: Present  Presentation: Vertex  General:  Alert, oriented and cooperative. Patient is in no acute distress.  Skin: Skin is warm and dry. No rash noted.   Cardiovascular: Normal heart rate noted  Respiratory: Normal respiratory effort, no problems with respiration noted  Abdomen: Soft, gravid, appropriate for gestational age.  Pain/Pressure: Present     Pelvic: Cervical exam deferred        Extremities: Normal range of motion.  Edema: Trace  Mental Status:  Normal mood and affect. Normal behavior. Normal judgment and thought content.   Assessment and Plan:  Pregnancy: G3P2002 at 6556w1d  1. Post term pregnancy, antepartum  condition or complication 2. Encounter for supervision of low-risk pregnancy in third trimester - US FETAL BPP WITH NONSTRESS done today, was 10/10. Cephalic presentation verified today.  - Scheduled for IOL tomorrow morning. Term labor symptoms and general obstetric precautions including but not limited to vaginal bleeding, contractions, leaking of fluid and fetal movement were reviewed in detail with the patient. Please refer to After Visit Summary for other counseling recommendations.  Return in about 4 weeks (around 07/05/2017) for Postpartum check.   Jaynie CollinsUgonna Kelijah Towry, MD

## 2017-06-07 NOTE — Progress Notes (Signed)
Pt informed that the ultrasound is considered a limited OB ultrasound and is not intended to be a complete ultrasound exam.  Patient also informed that the ultrasound is not being completed with the intent of assessing for fetal or placental anomalies or any pelvic abnormalities.  Explained that the purpose of today's ultrasound is to assess for presentation, BPP and amniotic fluid volume.  Patient acknowledges the purpose of the exam and the limitations of the study.    Audio interpreter Coy SaunasRosemary # [phone] used for encounter.

## 2017-06-08 ENCOUNTER — Inpatient Hospital Stay (HOSPITAL_COMMUNITY)
Admission: RE | Admit: 2017-06-08 | Discharge: 2017-06-09 | DRG: 807 | Disposition: A | Discharge status: Home / Self Care | Payer: Medicaid Other | Source: Ambulatory Visit | Attending: Family Medicine | Admitting: Family Medicine

## 2017-06-08 ENCOUNTER — Encounter (HOSPITAL_COMMUNITY): Payer: Self-pay | Admitting: Certified Registered Nurse Anesthetist

## 2017-06-08 ENCOUNTER — Encounter (HOSPITAL_COMMUNITY): Payer: Self-pay

## 2017-06-08 DIAGNOSIS — O99214 Obesity complicating childbirth: Secondary | ICD-10-CM | POA: Diagnosis present

## 2017-06-08 DIAGNOSIS — O480 Post-term pregnancy: Secondary | ICD-10-CM | POA: Diagnosis present

## 2017-06-08 DIAGNOSIS — Z3A41 41 weeks gestation of pregnancy: Secondary | ICD-10-CM

## 2017-06-08 DIAGNOSIS — E669 Obesity, unspecified: Secondary | ICD-10-CM | POA: Diagnosis present

## 2017-06-08 LAB — CBC
HEMATOCRIT: 32.9 % — AB (ref 36.0–46.0)
HEMATOCRIT: 37.8 % (ref 36.0–46.0)
HEMOGLOBIN: 12.3 g/dL (ref 12.0–15.0)
Hemoglobin: 11 g/dL — ABNORMAL LOW (ref 12.0–15.0)
MCH: 29.6 pg (ref 26.0–34.0)
MCH: 28.5 pg (ref 26.0–34.0)
MCHC: 33.4 g/dL (ref 30.0–36.0)
MCHC: 32.5 g/dL (ref 30.0–36.0)
MCV: 88.4 fL (ref 78.0–100.0)
MCV: 87.7 fL (ref 78.0–100.0)
PLATELETS: 123 10*3/uL — AB (ref 150–400)
Platelets: 139 10*3/uL — ABNORMAL LOW (ref 150–400)
RBC: 3.72 MIL/uL — ABNORMAL LOW (ref 3.87–5.11)
RBC: 4.31 MIL/uL (ref 3.87–5.11)
RDW: 14.1 % (ref 11.5–15.5)
RDW: 14.1 % (ref 11.5–15.5)
WBC: 4.6 10*3/uL (ref 4.0–10.5)
WBC: 9.8 10*3/uL (ref 4.0–10.5)

## 2017-06-08 LAB — COMPREHENSIVE METABOLIC PANEL
ALBUMIN: 3 g/dL — AB (ref 3.5–5.0)
ALK PHOS: 171 U/L — AB (ref 38–126)
ALT: 10 U/L — AB (ref 14–54)
AST: 22 U/L (ref 15–41)
Anion gap: 7 (ref 5–15)
BILIRUBIN TOTAL: 0.4 mg/dL (ref 0.3–1.2)
BUN: 6 mg/dL (ref 6–20)
CALCIUM: 8.4 mg/dL — AB (ref 8.9–10.3)
CO2: 23 mmol/L (ref 22–32)
CREATININE: 0.43 mg/dL — AB (ref 0.44–1.00)
Chloride: 107 mmol/L (ref 101–111)
GFR calc Af Amer: >60 mL/min (ref >60)
GFR calc non Af Amer: >60 mL/min (ref >60)
GLUCOSE: 79 mg/dL (ref 65–99)
Potassium: 3.6 mmol/L (ref 3.5–5.1)
Sodium: 137 mmol/L (ref 135–145)
TOTAL PROTEIN: 7.1 g/dL (ref 6.5–8.1)

## 2017-06-08 LAB — TYPE AND SCREEN
ABO/RH(D): O POS
Antibody Screen: NEGATIVE

## 2017-06-08 LAB — RPR: RPR Ser Ql: NONREACTIVE

## 2017-06-08 MED ORDER — METHYLERGONOVINE MALEATE 0.2 MG/ML IJ SOLN
0.2000 mg | Freq: Once | INTRAMUSCULAR | Status: AC
  Administered 2017-06-08: 0.2 mg via INTRAMUSCULAR

## 2017-06-08 MED ORDER — LACTATED RINGERS IV SOLN
INTRAVENOUS | Status: DC
  Administered 2017-06-08 (×2): via INTRAVENOUS

## 2017-06-08 MED ORDER — OXYCODONE-ACETAMINOPHEN 5-325 MG PO TABS: 1.0000 | ORAL_TABLET | ORAL | Status: DC | PRN

## 2017-06-08 MED ORDER — LACTATED RINGERS IV SOLN: 500.0000 mL | INTRAVENOUS | Status: DC | PRN

## 2017-06-08 MED ORDER — WITCH HAZEL-GLYCERIN EX PADS: 1.0000 "application " | MEDICATED_PAD | CUTANEOUS | Status: DC | PRN

## 2017-06-08 MED ORDER — COCONUT OIL OIL: 1.0000 "application " | TOPICAL_OIL | Status: DC | PRN

## 2017-06-08 MED ORDER — FENTANYL CITRATE (PF) 100 MCG/2ML IJ SOLN
50.0000 ug | INTRAMUSCULAR | Status: DC | PRN
Start: 2017-06-08 — End: 2017-06-08
  Administered 2017-06-08: 50 ug via INTRAVENOUS
  Filled 2017-06-08: qty 2

## 2017-06-08 MED ORDER — PRENATAL MULTIVITAMIN CH
1.0000 | ORAL_TABLET | Freq: Every day | ORAL | Status: DC
  Administered 2017-06-09: 1 via ORAL
  Filled 2017-06-08: qty 1

## 2017-06-08 MED ORDER — ACETAMINOPHEN 325 MG PO TABS
650.0000 mg | ORAL_TABLET | ORAL | Status: DC | PRN
  Administered 2017-06-08: 650 mg via ORAL
  Filled 2017-06-08: qty 2

## 2017-06-08 MED ORDER — OXYCODONE-ACETAMINOPHEN 5-325 MG PO TABS: 2.0000 | ORAL_TABLET | ORAL | Status: DC | PRN

## 2017-06-08 MED ORDER — IBUPROFEN 600 MG PO TABS
600.0000 mg | ORAL_TABLET | Freq: Four times a day (QID) | ORAL | Status: DC
  Administered 2017-06-08 – 2017-06-09 (×5): 600 mg via ORAL
  Filled 2017-06-08 (×5): qty 1

## 2017-06-08 MED ORDER — ONDANSETRON HCL 4 MG PO TABS: 4.0000 mg | ORAL_TABLET | ORAL | Status: DC | PRN

## 2017-06-08 MED ORDER — OXYTOCIN 40 UNITS IN LACTATED RINGERS INFUSION - SIMPLE MED
1.0000 m[IU]/min | INTRAVENOUS | Status: DC
  Filled 2017-06-08: qty 1000

## 2017-06-08 MED ORDER — SIMETHICONE 80 MG PO CHEW: 80.0000 mg | CHEWABLE_TABLET | ORAL | Status: DC | PRN

## 2017-06-08 MED ORDER — OXYTOCIN 40 UNITS IN LACTATED RINGERS INFUSION - SIMPLE MED
2.5000 [IU]/h | INTRAVENOUS | Status: DC
  Administered 2017-06-08: 2.5 [IU]/h via INTRAVENOUS

## 2017-06-08 MED ORDER — LIDOCAINE HCL (PF) 1 % IJ SOLN
30.0000 mL | INTRAMUSCULAR | Status: DC | PRN
  Filled 2017-06-08: qty 30

## 2017-06-08 MED ORDER — OXYTOCIN BOLUS FROM INFUSION
500.0000 mL | Freq: Once | INTRAVENOUS | Status: AC
  Administered 2017-06-08: 500 mL via INTRAVENOUS

## 2017-06-08 MED ORDER — SENNOSIDES-DOCUSATE SODIUM 8.6-50 MG PO TABS
2.0000 | ORAL_TABLET | ORAL | Status: DC
  Administered 2017-06-08: 2 via ORAL
  Filled 2017-06-08: qty 2

## 2017-06-08 MED ORDER — TETANUS-DIPHTH-ACELL PERTUSSIS 5-2.5-18.5 LF-MCG/0.5 IM SUSP: 0.5000 mL | Freq: Once | INTRAMUSCULAR | Status: DC

## 2017-06-08 MED ORDER — DIBUCAINE 1 % RE OINT: 1.0000 "application " | TOPICAL_OINTMENT | RECTAL | Status: DC | PRN

## 2017-06-08 MED ORDER — ZOLPIDEM TARTRATE 5 MG PO TABS: 5.0000 mg | ORAL_TABLET | Freq: Every evening | ORAL | Status: DC | PRN

## 2017-06-08 MED ORDER — MISOPROSTOL 25 MCG QUARTER TABLET
25.0000 ug | ORAL_TABLET | ORAL | Status: DC | PRN
  Administered 2017-06-08 (×2): 25 ug via VAGINAL
  Filled 2017-06-08 (×4): qty 1

## 2017-06-08 MED ORDER — DIPHENHYDRAMINE HCL 25 MG PO CAPS: 25.0000 mg | ORAL_CAPSULE | Freq: Four times a day (QID) | ORAL | Status: DC | PRN

## 2017-06-08 MED ORDER — METHYLERGONOVINE MALEATE 0.2 MG/ML IJ SOLN
INTRAMUSCULAR | Status: AC
  Administered 2017-06-08: 0.2 mg via INTRAMUSCULAR
  Filled 2017-06-08: qty 1

## 2017-06-08 MED ORDER — SOD CITRATE-CITRIC ACID 500-334 MG/5ML PO SOLN: 30.0000 mL | ORAL | Status: DC | PRN

## 2017-06-08 MED ORDER — BENZOCAINE-MENTHOL 20-0.5 % EX AERO: 1.0000 "application " | INHALATION_SPRAY | CUTANEOUS | Status: DC | PRN

## 2017-06-08 MED ORDER — ONDANSETRON HCL 4 MG/2ML IJ SOLN: 4.0000 mg | INTRAMUSCULAR | Status: DC | PRN

## 2017-06-08 MED ORDER — ONDANSETRON HCL 4 MG/2ML IJ SOLN: 4.0000 mg | Freq: Four times a day (QID) | INTRAMUSCULAR | Status: DC | PRN

## 2017-06-08 MED ORDER — HYDROXYZINE HCL 50 MG PO TABS
50.0000 mg | ORAL_TABLET | Freq: Four times a day (QID) | ORAL | Status: DC | PRN
  Filled 2017-06-08: qty 1

## 2017-06-08 MED ORDER — TERBUTALINE SULFATE 1 MG/ML IJ SOLN
0.2500 mg | Freq: Once | INTRAMUSCULAR | Status: DC | PRN
  Filled 2017-06-08: qty 1

## 2017-06-08 MED ORDER — FLEET ENEMA 7-19 GM/118ML RE ENEM: 1.0000 | ENEMA | Freq: Every day | RECTAL | Status: DC | PRN

## 2017-06-08 MED ORDER — ACETAMINOPHEN 325 MG PO TABS
650.0000 mg | ORAL_TABLET | ORAL | Status: DC | PRN
Start: 2017-06-08 — End: 2017-06-10
  Administered 2017-06-09 (×4): 650 mg via ORAL
  Filled 2017-06-08 (×4): qty 2

## 2017-06-08 NOTE — Progress Notes (Signed)
   Sandra HelperChantal Krisko is a 21 y.o. G3P2002 at 4831w2d  admitted for induction of labor due to postterm.  Subjective: Coping well; eating in bed.  S/p one dose of cytotec vaginally.   Objective: Vitals:   06/08/17 1011 06/08/17 1110 06/08/17 1203 06/08/17 1254  BP: (!) 118/59 118/70 123/85 117/76  Pulse: 80 75 84 75  Resp:    16  Temp:   98.3 F (36.8 C)   TempSrc:   Oral   Weight:      Height:       No intake/output data recorded.  FHT:  FHR: 140 bpm, variability: moderate,  accelerations:  Present,  decelerations:  Present occasional non-repetitive variable UC:   irregular SVE:   Dilation: Closed Effacement (%): Thick Station: Ballotable Exam by:: Karen ChafeLockamy, MD Pitocin @ 0 mu/min  Labs: Lab Results  Component Value Date   WBC 4.6 06/08/2017   HGB 11.0 (L) 06/08/2017   HCT 32.9 (L) 06/08/2017   MCV 88.4 06/08/2017   PLT 123 (L) 06/08/2017    Assessment / Plan: S/p 1 dose of cytotec; not contracting yet. Will continue with cytotec vaginally.   Labor: early labor Fetal Wellbeing:  Category I Pain Control:  Labor support without medications Anticipated MOD:  NSVD  Charlesetta GaribaldiKathryn Lorraine Orla Estrin CNM 06/08/2017, 1:15 PM

## 2017-06-08 NOTE — Anesthesia Pain Management Evaluation Note (Signed)
  CRNA Pain Management Visit Note  Patient: Sandra Sexton, 21 y.o., female  "Hello [facility] am [facility]. We have an anesthesia team available at all times to provide care throughout the hospital, including epidural management and anesthesia for C-section. I don't know your plan for the delivery whether it a natural birth, water birth, IV sedation, nitrous supplementation, doula or epidural, but we want to meet your pain goals."   1.Was your pain managed to your expectations on prior hospitalizations?   No prior hospitalizations  2.What is your expectation for pain management during this hospitalization?     Labor support without medications  3.How can we help you reach that goal?   Record the patient's initial score and the patient's pain goal.   Pain: 6  Pain Goal: 10 [facility] wants you to be able to say your pain was always managed very well.  Laban EmperorMalinova,Sandra Sexton 06/08/2017

## 2017-06-08 NOTE — Progress Notes (Signed)
Pt called via phone number provided regarding 0730 scheduled induction.  Mail box full, unable to leave a message.  Will attempt to contact patient again.  -Kittie PlaterSarah Lovella Hardie RN

## 2017-06-08 NOTE — Progress Notes (Signed)
Interpreter used for admission and evaluation by MD. All information is provided by patient as accurate.   Lahoma Crockeraylor Keyshawn Hellwig, RN

## 2017-06-08 NOTE — Progress Notes (Signed)
[name] is a 21 y.o. G3P2002 at 4851w2d admitted for induction of labor due to Post dates.  Subjective: Sandra Sexton is doing well. She currently rates her pain with contractions at a  6 out of 10. She still states she does not want an epidural.  Objective: BP (!) 106/55   Pulse 99   Temp 98.3 F (36.8 C) (Oral)   Resp 16   Ht 5\' 3"  (1.6 m)   Wt 174 lb (78.9 kg)   LMP 08/19/2016 (Exact Date)   BMI 30.82 kg/m  No intake/output data recorded. No intake/output data recorded.  FHT:  FHR: 145 bpm, variability: minimal ,  accelerations:  Present,  decelerations:  Absent UC:   irregular, every 1-3 minutes SVE:   Dilation: 1 Effacement (%): Thick Station: Ballotable Exam by:: WellPointKathryn K. CNM  Labs: Lab Results  Component Value Date   WBC 4.6 06/08/2017   HGB 11.0 (L) 06/08/2017   HCT 32.9 (L) 06/08/2017   MCV 88.4 06/08/2017   PLT 123 (L) 06/08/2017    Assessment / Plan: IOL due to postdates. S/p cytotec x 2. Reassess and consider Pitocin if dilated to 4cm or more  Labor: Progressing normally and has had cytotec x 2; consider Pitocin if dilated >4cm Preeclampsia:  n/a Fetal Wellbeing:  Category I Pain Control:  IV pain meds I/D:  GBS neg Anticipated MOD:  NSVD  Arlyce Harmanimothy Anquinette Pierro 06/08/2017, 5:02 PM

## 2017-06-08 NOTE — H&P (Signed)
LABOR AND DELIVERY ADMISSION HISTORY AND PHYSICAL NOTE  Sandra Sexton is a 21 y.o. female 423P2002 with IUP at 10514w2d by LMP presenting for IOL due to post dates.   She reports positive fetal movement. She denies leakage of fluid or vaginal bleeding. She has had some nausea and vomiting with this pregnancy but it has improved over the last few weeks. She also endorses 2 weeks of painful urination.  She denies fever, recent sickness, headache, blurry vision, abdominal pain, or any other health complaints at this time.  Prenatal History/Complications[facility] Pregnancy complications:  - none  Past Medical History: Past Medical History:  Diagnosis Date  . H/O malaria 2015   [place]    Past Surgical History: Past Surgical History:  Procedure Laterality Date  . NO PAST SURGERIES      Obstetrical History: OB History    Gravida Para Term Preterm AB Living   3 2 2     2    SAB TAB Ectopic Multiple Live Births         0 2      Social History: Social History   Socioeconomic History  . Marital status: Married    Spouse name: None  . Number of children: None  . Years of education: None  . Highest education level: None  Social Needs  . Financial resource strain: None  . Food insecurity - worry: None  . Food insecurity - inability: None  . Transportation needs - medical: None  . Transportation needs - non-medical: None  Occupational History  . None  Tobacco Use  . Smoking status: Never Smoker  . Smokeless tobacco: Never Used  Substance and Sexual Activity  . Alcohol use: No  . Drug use: No  . Sexual activity: Yes    Birth control/protection: None  Other Topics Concern  . None  Social History Narrative  . None    Family History: History reviewed. No pertinent family history.  Allergies: No Known Allergies  Medications Prior to Admission  Medication Sig Dispense Refill Last Dose  . Cetirizine HCl (ZYRTEC ALLERGY CHILDRENS) 10 MG TBDP  One a day (Patient not taking: Reported on 05/11/2017) 20 tablet 0 Not Taking  . docusate sodium (COLACE) 100 MG capsule Take 1 capsule (100 mg total) by mouth daily. (Patient not taking: Reported on 05/11/2017) 30 capsule 1 Not Taking  . ferrous sulfate 325 (65 FE) MG tablet Take 1 tablet (325 mg total) by mouth daily with breakfast. (Patient not taking: Reported on 05/11/2017) 30 tablet 3 Not Taking  . fluticasone (FLONASE) 50 MCG/ACT nasal spray Place 2 sprays into both nostrils daily. (Patient not taking: Reported on 03/15/2017) 9.9 g 2 Not Taking  . Prenatal Vit-Fe Fumarate-FA (MULTIVITAMIN-PRENATAL) 27-0.8 MG TABS tablet Take 1 tablet by mouth daily. (Patient not taking: Reported on 05/11/2017) 30 tablet 12 Not Taking  . Tetrahydrozoline HCl (EYE DROPS OP) Apply 1 drop to eye 3 (three) times daily. Per patient not sure of name of eye drops- given them in ED   Not Taking     Review of Systems  All systems reviewed and negative except as stated in HPI  Physical Exam Blood pressure 110/74, pulse 78, temperature 98.4 F (36.9 C), temperature source Oral, resp. rate 16, height 5\' 3"  (1.6 m), weight 174 lb (78.9 kg), last menstrual period 08/19/2016, unknown if currently breastfeeding. General appearance: alert, cooperative and no distress Lungs: clear to auscultation bilaterally Heart: regular rate and rhythm Abdomen: soft,  non-tender; bowel sounds normal Extremities: No calf swelling or tenderness Presentation: cephalic Fetal monitoring: 145bpm, minimal variability, accelerations present, no decels Uterine activity: irregular every 8-10 min Dilation: Closed Effacement (%): Thick Station: Ballotable Exam by:: Karen ChafeLockamy, MD  Prenatal labs: ABO, Rh: --/--/O POS (11/07 29560834) Antibody: NEG (11/07 [id number]) Rubella: 23.70 (05/09 1408) RPR: Non Reactive (08/14 0954)  HBsAg: Negative (05/09 1408)  HIV:   negative GC/Chlamydia: negative GBS: Negative (10/12 0000)  1 hr Glucola: normal Genetic  screening:  Normal, CF gene test negative Anatomy US: normal  Prenatal Transfer Tool  Maternal Diabetes: No Genetic Screening: Normal Maternal Ultrasounds/Referrals: Normal Fetal Ultrasounds or other Referrals:  None Maternal Substance Abuse:  No Significant Maternal Medications:  None Significant Maternal Lab Results: None  Results for orders placed or performed during the hospital encounter of 06/08/17 (from the past 24 hour(s))  CBC   Collection Time: 06/08/17  8:34 AM  Result Value Ref Range   WBC 4.6 4.0 - 10.5 K/uL   RBC 3.72 (L) 3.87 - 5.11 MIL/uL   Hemoglobin 11.0 (L) 12.0 - 15.0 g/dL   HCT 86.532.9 (L) 78.436.0 - 69.646.0 %   MCV 88.4 78.0 - 100.0 fL   MCH 29.6 26.0 - 34.0 pg   MCHC 33.4 30.0 - 36.0 g/dL   RDW 29.514.1 28.411.5 - 13.215.5 %   Platelets 123 (L) 150 - 400 K/uL  Type and screen   Collection Time: 06/08/17  8:34 AM  Result Value Ref Range   ABO/RH(D) O POS    Antibody Screen NEG    Sample Expiration 06/11/2017     Patient Active Problem List   Diagnosis Date Noted  . Post term pregnancy at [redacted] weeks gestation 06/08/2017  . Supervision of low-risk pregnancy 12/08/2016  . Short interval between pregnancies affecting pregnancy, antepartum 12/08/2016  . Obesity (BMI 30.0-34.9) 12/08/2016  . Obesity in pregnancy 12/08/2016  . Late prenatal care 12/08/2016  . Language barrier affecting health care 05/20/2015    Assessment: Sandra Sexton is a 21 y.o. G3P2002 at 7658w2d here for IOL due to post dates.  #Labor: Induction with cytotec and then reassess in 4 hours. Consider Foley bulb if 2cm dilation or less at next check with additional cytotec. If >4 consider starting Pitocin. #Pain: IV pain meds, says she wants to try without Epidural #FWB: Cat 1 #ID:  GBS negative #MOF: breast #MOC: considering IUD #Circ:  Boy, circ outpatient  Arlyce Harmanimothy Lockamy 06/08/2017, 10:03 AM   I confirm that I have verified the information documented in the resident's note and that I have also  personally reperformed the physical exam and all medical decision making activities.  Luna KitchensKathryn Rayhan Groleau CNM

## 2017-06-08 NOTE — Progress Notes (Signed)
[name] is a 21 y.o. G3P2002 at 716w2d  admitted for induction of labor due to postterm.  Subjective: Patient is coping well. Currently no diet restrictions. No new issues.   Objective: BP 109/62   Pulse 96   Temp 98.3 F (36.8 C) (Oral)   Resp 16   Ht 5\' 3"  (1.6 m)   Wt 174 lb (78.9 kg)   LMP 08/19/2016 (Exact Date)   BMI 30.82 kg/m  No intake/output data recorded. No intake/output data recorded.  FHT:  FHR: 150 bpm, variability: minimal ,  accelerations:  Present,  decelerations:  Absent UC:   irregular SVE:   Dilation: 1 Effacement (%): Thick Station: Ballotable Exam by:: WellPointKathryn K. CNM  Labs: Lab Results  Component Value Date   WBC 4.6 06/08/2017   HGB 11.0 (L) 06/08/2017   HCT 32.9 (L) 06/08/2017   MCV 88.4 06/08/2017   PLT 123 (L) 06/08/2017    Assessment / Plan: IOL. Was given a second dose of cytotec vaginally. Will continue to monitor labor course.   Labor: Progressing normally Fetal Wellbeing:  Category I Pain Control:  Labor support without medications Anticipated MOD:  NSVD  Arlyce Harmanimothy Drayton Tieu 06/08/2017, 2:02 PM

## 2017-06-09 ENCOUNTER — Other Ambulatory Visit: Payer: Self-pay

## 2017-06-09 MED ORDER — IBUPROFEN 600 MG PO TABS
600.0000 mg | ORAL_TABLET | Freq: Four times a day (QID) | ORAL | 0 refills | Status: DC
Start: 2017-06-09 — End: 2017-08-18

## 2017-06-09 MED ORDER — SENNOSIDES-DOCUSATE SODIUM 8.6-50 MG PO TABS: 2.0000 | ORAL_TABLET | ORAL | 0 refills | Status: DC

## 2017-06-09 NOTE — Addendum Note (Signed)
Addended by: Marny LowensteinWENZEL, Rigo Letts N on: 06/09/2017 12:56 PM   Modules accepted: Level of Service

## 2017-06-09 NOTE — Discharge Summary (Signed)
OB Discharge Summary     Patient Name: Sandra Sexton DOB: 06/21/1996 MRN: [id number]  Date of admission: 06/08/2017 Delivering MD: Marylene LandKOOISTRA, KATHRYN LORRAINE   Date of discharge: 06/09/2017  Admitting diagnosis: INDUCTION Intrauterine pregnancy: 1036w2d     Secondary diagnosis:  Active Problems:   Post term pregnancy at [redacted] weeks gestation   SVD (spontaneous vaginal delivery)  Additional problems: Patient Active Problem List   Diagnosis Date Noted  . SVD (spontaneous vaginal delivery) 06/09/2017  . Post term pregnancy at [redacted] weeks gestation 06/08/2017  . Supervision of low-risk pregnancy 12/08/2016  . Short interval between pregnancies affecting pregnancy, antepartum 12/08/2016  . Obesity (BMI 30.0-34.9) 12/08/2016  . Obesity in pregnancy 12/08/2016  . Late prenatal care 12/08/2016  . Language barrier affecting health care 05/20/2015        Discharge diagnosis: Term Pregnancy Delivered                                                                                                Post partum procedures:none  Augmentation: Cytotec  Complications: None  Hospital course:  Induction of Labor With Vaginal Delivery   21 y.o. yo G3P3003 at 7736w2d was admitted to the hospital 06/08/2017 for induction of labor.  Indication for induction: Postdates.  Patient had an uncomplicated labor course as follows: Membrane Rupture Time/Date: 6:12 PM ,06/08/2017   Intrapartum Procedures: Episiotomy: None [1]                                         Lacerations:  None [1]  Patient had delivery of a Viable infant.  Information for the patient's newborn:  Riley Churchesyiraneza, Boy Raygan [[id number]][[id number]]  Delivery Method: Vag-Spont   06/08/2017  Details of delivery can be found in separate delivery note.  Patient had a routine postpartum course. Patient is discharged home 06/09/17. She desires early d/c.  Physical exam  Vitals:   06/08/17 2200 06/08/17 2330 06/09/17 0318 06/09/17 0538  BP: 109/61 106/60  110/68 114/69  Pulse: 63 71 74 68  Resp: 18 18 18 20   Temp: 98.5 F (36.9 C) 98.3 F (36.8 C) 98.3 F (36.8 C) 98.3 F (36.8 C)  TempSrc: Oral Oral Oral Axillary  Weight:      Height:       General: alert, cooperative and no distress Lochia: appropriate Uterine Fundus: firm Incision: N/A DVT Evaluation: No evidence of DVT seen on physical exam. Labs: Lab Results  Component Value Date   WBC 9.8 06/08/2017   HGB 12.3 06/08/2017   HCT 37.8 06/08/2017   MCV 87.7 06/08/2017   PLT 139 (L) 06/08/2017   CMP Latest Ref Rng & Units 06/08/2017  Glucose 65 - 99 mg/dL 79  BUN 6 - 20 mg/dL 6  Creatinine 0.860.44 - 5.781.00 mg/dL 4.69(G0.43(L)  Sodium 295135 - 284145 mmol/L 137  Potassium 3.5 - 5.1 mmol/L 3.6  Chloride 101 - 111 mmol/L 107  CO2 22 - 32 mmol/L 23  Calcium 8.9 - 10.3 mg/dL 1.3(K8.4(L)  Total Protein  6.5 - 8.1 g/dL 7.1  Total Bilirubin 0.3 - 1.2 mg/dL 0.4  Alkaline Phos 38 - 126 U/L 171(H)  AST 15 - 41 U/L 22  ALT 14 - 54 U/L 10(L)    Discharge instruction: per After Visit Summary and "Baby and Me Booklet".  After visit meds:  Allergies as of 06/09/2017   No Known Allergies     Medication List    STOP taking these medications   docusate sodium 100 MG capsule Commonly known as:  COLACE   EYE DROPS OP     TAKE these medications   Cetirizine HCl 10 MG Tbdp Commonly known as:  ZYRTEC ALLERGY CHILDRENS One a day   ferrous sulfate 325 (65 FE) MG tablet Take 1 tablet (325 mg total) by mouth daily with breakfast.   fluticasone 50 MCG/ACT nasal spray Commonly known as:  FLONASE Place 2 sprays into both nostrils daily.   ibuprofen 600 MG tablet Commonly known as:  ADVIL,MOTRIN Take 1 tablet (600 mg total) every 6 (six) hours by mouth.   multivitamin-prenatal 27-0.8 MG Tabs tablet Take 1 tablet by mouth daily.   senna-docusate 8.6-50 MG tablet Commonly known as:  Senokot-S Take 2 tablets daily by mouth. Start taking on:  06/10/2017       Diet: routine diet  Activity:  Advance as tolerated. Pelvic rest for 6 weeks.   Outpatient follow up:4 weeks Follow up Appt:No future appointments. Follow up Visit:No Follow-up on file.  Postpartum contraception: IUD  Newborn Data: Live born female  Birth Weight: 7 lb 12 oz (3515 g) APGAR: 7, 9  Newborn Delivery   Birth date/time:  06/08/2017 18:20:00 Delivery type:  Vaginal, Spontaneous     Baby Feeding: Breast Disposition:home with mother   06/09/2017 Suella BroadKeriann S Minott, MD  CNM attestation I have seen and examined this patient and agree with above documentation in the resident's note.   Sandra HelperChantal Ekstein is a 21 y.o. [id number] s/p SVD.   Pain is well controlled.  Plan for birth control is IUD.  Method of Feeding: breast  PE:  BP 114/69   Pulse 68   Temp 98.3 F (36.8 C) (Axillary)   Resp 20   Ht 5\' 3"  (1.6 m)   Wt 78.9 kg (174 lb)   LMP 08/19/2016 (Exact Date)   Breastfeeding? Unknown   BMI 30.82 kg/m  Fundus firm  Recent Labs    06/08/17 0834 06/08/17 2101  HGB 11.0* 12.3  HCT 32.9* 37.8     Plan: discharge today - postpartum care discussed - f/u clinic in 4 weeks for postpartum visit   Cam HaiSHAW, Phylisha Dix, CNM 9:28 AM 06/09/2017

## 2017-06-09 NOTE — Progress Notes (Signed)
Patients states she will do her edinburgh screen when her husband returns.

## 2017-06-09 NOTE — Progress Notes (Signed)
D/C education reviewed with pt and husband. Reviewed reasons to call MD. Answered all questions.

## 2017-06-09 NOTE — Lactation Note (Signed)
This note was copied from a baby's chart. Lactation Consultation Note Mom speaks swahili. Called language line 2 times. Connected [phone], unable to communicate well d/t bad connection. LC frustrated at connection. MOm stated "maybe something is wrong with their phone." Mom speaks a little [place]. LC stated,"yes I think it is.' asked mom if LC could ask her about BF. Mom stated ok. Stated feeding going good. This is mom's 3rd child. Her 1st child is 574 yrs old and BF for 2 yrs, mom's 2nd child is 2 yrs old and BF for 11 months. Mom states she may BF this baby for 6 months maybe if she goes back to work. It's hard to Bf and work. Discussed Bf baby when she is at home with the baby. Mom stated maybe she will see how it goes. Mom has round breast w/everted nipples. Hand expressed colostrum. Encouraged STS. Showed mom I&O sheet, asked mom to mark on paper when baby has pee and poop/cocci. Mom smile and said pee from here, pointing from front and poop from here, pointing at buttocks. LC stated yes. Mom smiled said Oh ok. Asked mom to call if needs assistance or questions. Mom stated, we are doing ok.  [place] [address], she was going to call them.  WH/LC brochure given w/resources, support groups and LC services.  Patient Name: Boy Bosie HelperChantal Sunderlin EAVWU'JToday's Date: 06/09/2017 Reason for consult: Initial assessment   Maternal Data Has patient been taught Hand Expression?: Yes Does the patient have breastfeeding experience prior to this delivery?: Yes  Feeding    LATCH Score       Type of Nipple: Everted at rest and after stimulation  Comfort (Breast/Nipple): Soft / non-tender        Interventions Interventions: Breast feeding basics reviewed;Hand express;Position options;Skin to skin  Lactation Tools Discussed/Used WIC Program: Yes   Consult Status Consult Status: Follow-up Date: 06/10/17 Follow-up type: In-patient    Shena Vinluan, Diamond NickelLAURA G 06/09/2017,  4:00 AM

## 2017-06-09 NOTE — Discharge Instructions (Signed)
Vaginal Delivery, Care After °Refer to this sheet in the next few weeks. These instructions provide you with information about caring for yourself after vaginal delivery. Your health care provider may also give you more specific instructions. Your treatment has been planned according to current medical practices, but problems sometimes occur. Call your health care provider if you have any problems or questions. °What can I expect after the procedure? °After vaginal delivery, it is common to have: °· Some bleeding from your vagina. °· Soreness in your abdomen, your vagina, and the area of skin between your vaginal opening and your anus (perineum). °· Pelvic cramps. °· Fatigue. ° °Follow these instructions at home: °Medicines °· Take over-the-counter and prescription medicines only as told by your health care provider. °· If you were prescribed an antibiotic medicine, take it as told by your health care provider. Do not stop taking the antibiotic until it is finished. °Driving ° °· Do not drive or operate heavy machinery while taking prescription pain medicine. °· Do not drive for 24 hours if you received a sedative. °Lifestyle °· Do not drink alcohol. This is especially important if you are breastfeeding or taking medicine to relieve pain. °· Do not use tobacco products, including cigarettes, chewing tobacco, or e-cigarettes. If you need help quitting, ask your health care provider. °Eating and drinking °· Drink at least 8 eight-ounce glasses of water every day unless you are told not to by your health care provider. If you choose to breastfeed your baby, you may need to drink more water than this. °· Eat high-fiber foods every day. These foods may help prevent or relieve constipation. High-fiber foods include: °? Whole grain cereals and breads. °? Brown rice. °? Beans. °? Fresh fruits and vegetables. °Activity °· Return to your normal activities as told by your health care provider. Ask your health care provider  what activities are safe for you. °· Rest as much as possible. Try to rest or take a nap when your baby is sleeping. °· Do not lift anything that is heavier than your baby or 10 lb (4.5 kg) until your health care provider says that it is safe. °· Talk with your health care provider about when you can engage in sexual activity. This may depend on your: °? Risk of infection. °? Rate of healing. °? Comfort and desire to engage in sexual activity. °Vaginal Care °· If you have an episiotomy or a vaginal tear, check the area every day for signs of infection. Check for: °? More redness, swelling, or pain. °? More fluid or blood. °? Warmth. °? Pus or a bad smell. °· Do not use tampons or douches until your health care provider says this is safe. °· Watch for any blood clots that may pass from your vagina. These may look like clumps of dark red, brown, or black discharge. °General instructions °· Keep your perineum clean and dry as told by your health care provider. °· Wear loose, comfortable clothing. °· Wipe from front to back when you use the toilet. °· Ask your health care provider if you can shower or take a bath. If you had an episiotomy or a perineal tear during labor and delivery, your health care provider may tell you not to take baths for a certain length of time. °· Wear a bra that supports your breasts and fits you well. °· If possible, have someone help you with household activities and help care for your baby for at least a few days after   you leave the hospital.  Keep all follow-up visits for you and your baby as told by your health care provider. This is important. Contact a health care provider if:  You have: ? Vaginal discharge that has a bad smell. ? Difficulty urinating. ? Pain when urinating. ? A sudden increase or decrease in the frequency of your bowel movements. ? More redness, swelling, or pain around your episiotomy or vaginal tear. ? More fluid or blood coming from your episiotomy or  vaginal tear. ? Pus or a bad smell coming from your episiotomy or vaginal tear. ? A fever. ? A rash. ? Little or no interest in activities you used to enjoy. ? Questions about caring for yourself or your baby.  Your episiotomy or vaginal tear feels warm to the touch.  Your episiotomy or vaginal tear is separating or does not appear to be healing.  Your breasts are painful, hard, or turn red.  You feel unusually sad or worried.  You feel nauseous or you vomit.  You pass large blood clots from your vagina. If you pass a blood clot from your vagina, save it to show to your health care provider. Do not flush blood clots down the toilet without having your health care provider look at them.  You urinate more than usual.  You are dizzy or light-headed.  You have not breastfed at all and you have not had a menstrual period for 12 weeks after delivery.  You have stopped breastfeeding and you have not had a menstrual period for 12 weeks after you stopped breastfeeding. Get help right away if:  You have: ? Pain that does not go away or does not get better with medicine. ? Chest pain. ? Difficulty breathing. ? Blurred vision or spots in your vision. ? Thoughts about hurting yourself or your baby.  You develop pain in your abdomen or in one of your legs.  You develop a severe headache.  You faint.  You bleed from your vagina so much that you fill two sanitary pads in one hour. This information is not intended to replace advice given to you by your health care provider. Make sure you discuss any questions you have with your health care provider. Document Released: 07/16/2000 Document Revised: 12/31/2015 Document Reviewed: 08/03/2015 Elsevier Interactive Patient Education  2017 [employer] Inc[employer]  Postpartum Care After Vaginal Delivery The period of time right after you deliver your newborn is called the postpartum period. What kind of medical care will I receive?  You may continue to  receive fluids and medicines through an IV tube inserted into one of your veins.  If an incision was made near your vagina (episiotomy) or if you had some vaginal tearing during delivery, cold compresses may be placed on your episiotomy or your tear. This helps to reduce pain and swelling.  You may be given a squirt bottle to use when you go to the bathroom. You may use this until you are comfortable wiping as usual. To use the squirt bottle, follow these steps: ? Before you urinate, fill the squirt bottle with warm water. Do not use hot water. ? After you urinate, while you are sitting on the toilet, use the squirt bottle to rinse the area around your urethra and vaginal opening. This rinses away any urine and blood. ? You may do this instead of wiping. As you start healing, you may use the squirt bottle before wiping yourself. Make sure to wipe gently. ? Fill the squirt bottle with  clean water every time you use the bathroom.  You will be given sanitary pads to wear. How can I expect to feel?  You may not feel the need to urinate for several hours after delivery.  You will have some soreness and pain in your abdomen and vagina.  If you are breastfeeding, you may have uterine contractions every time you breastfeed for up to several weeks postpartum. Uterine contractions help your uterus return to its normal size.  It is normal to have vaginal bleeding (lochia) after delivery. The amount and appearance of lochia is often similar to a menstrual period in the first week after delivery. It will gradually decrease over the next few weeks to a dry, yellow-brown discharge. For most women, lochia stops completely by 6-8 weeks after delivery. Vaginal bleeding can vary from woman to woman.  Within the first few days after delivery, you may have breast engorgement. This is when your breasts feel heavy, full, and uncomfortable. Your breasts may also throb and feel hard, tightly stretched, warm, and tender.  After this occurs, you may have milk leaking from your breasts.Your health care provider can help you relieve discomfort due to breast engorgement. Breast engorgement should go away within a few days.  You may feel more sad or worried than normal due to hormonal changes after delivery. These feelings should not last more than a few days. If these feelings do not go away after several days, speak with your health care provider. How should I care for myself?  Tell your health care provider if you have pain or discomfort.  Drink enough water to keep your urine clear or pale yellow.  Wash your hands thoroughly with soap and water for at least 20 seconds after changing your sanitary pads, after using the toilet, and before holding or feeding your baby.  If you are not breastfeeding, avoid touching your breasts a lot. Doing this can make your breasts produce more milk.  If you become weak or lightheaded, or you feel like you might faint, ask for help before: ? Getting out of bed. ? Showering.  Change your sanitary pads frequently. Watch for any changes in your flow, such as a sudden increase in volume, a change in color, the passing of large blood clots. If you pass a blood clot from your vagina, save it to show to your health care provider. Do not flush blood clots down the toilet without having your health care provider look at them.  Make sure that all your vaccinations are up to date. This can help protect you and your baby from getting certain diseases. You may need to have immunizations done before you leave the hospital.  If desired, talk with your health care provider about methods of family planning or birth control (contraception). How can I start bonding with my baby? Spending as much time as possible with your baby is very important. During this time, you and your baby can get to know each other and develop a bond. Having your baby stay with you in your room (rooming in) can give you  time to get to know your baby. Rooming in can also help you become comfortable caring for your baby. Breastfeeding can also help you bond with your baby. How can I plan for returning home with my baby?  Make sure that you have a car seat installed in your vehicle. ? Your car seat should be checked by a certified car seat installer to make sure that it is  installed safely. ? Make sure that your baby fits into the car seat safely.  Ask your health care provider any questions you have about caring for yourself or your baby. Make sure that you are able to contact your health care provider with any questions after leaving the hospital. This information is not intended to replace advice given to you by your health care provider. Make sure you discuss any questions you have with your health care provider. Document Released: 05/16/2007 Document Revised: 12/22/2015 Document Reviewed: 06/23/2015 Elsevier Interactive Patient Education  2018 [employer]  Home Care Instructions for Mom ACTIVITY Gradually return to your regular activities. Let yourself rest. Nap while your baby sleeps. Avoid lifting anything that is heavier than 10 lb (4.5 kg) until your health care provider says it is okay. Avoid activities that take a lot of effort and energy (are strenuous) until approved by your health care provider. Walking at a slow-to-moderate pace is usually safe. If you had a cesarean delivery: Do not vacuum, climb stairs, or drive a car for 4-6 weeks. Have someone help you at home until you feel like you can do your usual activities yourself. Do exercises as told by your health care provider, if this applies.  VAGINAL BLEEDING You may continue to bleed for 4-6 weeks after delivery. Over time, the amount of blood usually decreases and the color of the blood usually gets lighter. However, the flow of bright red blood may increase if you have been too active. If you need to use more than one pad in an hour  because your pad gets soaked, or if you pass a large clot: Lie down. Raise your feet. Place a cold compress on your lower abdomen. Rest. Call your health care provider.  If you are breastfeeding, your period should return anytime between 8 weeks after delivery and the time that you stop breastfeeding. If you are not breastfeeding, your period should return 6-8 weeks after delivery. PERINEAL CARE The perineal area, or perineum, is the part of your body between your thighs. After delivery, this area needs special care. Follow these instructions as told by your health care provider. Take warm tub baths for 15-20 minutes. Use medicated pads and pain-relieving sprays and creams as told. Do not use tampons or douches until vaginal bleeding has stopped. Each time you go to the bathroom: Use a peri bottle. Change your pad. Use towelettes in place of toilet paper until your stitches have healed. Do Kegel exercises every day. Kegel exercises help to maintain the muscles that support the vagina, bladder, and bowels. You can do these exercises while you are standing, sitting, or lying down. To do Kegel exercises: Tighten the muscles of your abdomen and the muscles that surround your birth canal. Hold for a few seconds. Relax. Repeat until you have done this 5 times in a row. To prevent hemorrhoids from developing or getting worse: Drink enough fluid to keep your urine clear or pale yellow. Avoid straining when having a bowel movement. Take over-the-counter medicines and stool softeners as told by your health care provider.  BREAST CARE Wear a tight-fitting bra. Avoid taking over-the-counter pain medicine for breast discomfort. Apply ice to the breasts to help with discomfort as needed: Put ice in a plastic bag. Place a towel between your skin and the bag. Leave the ice on for 20 minutes or as told by your health care provider.  NUTRITION Eat a well-balanced diet. Do not try to lose weight  quickly by cutting  back on calories. Take your prenatal vitamins until your postpartum checkup or until your health care provider tells you to stop.  POSTPARTUM DEPRESSION You may find yourself crying for no apparent reason and unable to cope with all of the changes that come with having a newborn. This mood is called postpartum depression. Postpartum depression happens because your hormone levels change after delivery. If you have postpartum depression, get support from your partner, friends, and family. If the depression does not go away on its own after several weeks, contact your health care provider. BREAST SELF-EXAM Do a breast self-exam each month, at the same time of the month. If you are breastfeeding, check your breasts just after a feeding, when your breasts are less full. If you are breastfeeding and your period has started, check your breasts on day 5, 6, or 7 of your period. Report any lumps, bumps, or discharge to your health care provider. Know that breasts are normally lumpy if you are breastfeeding. This is temporary, and it is not a health risk. INTIMACY AND SEXUALITY Avoid sexual activity for at least 3-4 weeks after delivery or until the brownish-red vaginal flow is completely gone. If you want to avoid pregnancy, use some form of birth control. You can get pregnant after delivery, even if you have not had your period. SEEK MEDICAL CARE IF: You feel unable to cope with the changes that a child brings to your life, and these feelings do not go away after several weeks. You notice a lump, a bump, or discharge on your breast.  SEEK IMMEDIATE MEDICAL CARE IF: Blood soaks your pad in 1 hour or less. You have: Severe pain or cramping in your lower abdomen. A bad-smelling vaginal discharge. A fever that is not controlled by medicine. A fever, and an area of your breast is red and sore. Pain or redness in your calf. Sudden, severe chest pain. Shortness of breath. Painful or  bloody urination. Problems with your vision. You vomit for 12 hours or longer. You develop a severe headache. You have serious thoughts about hurting yourself, your child, or anyone else.  This information is not intended to replace advice given to you by your health care provider. Make sure you discuss any questions you have with your health care provider. Document Released: 07/16/2000 Document Revised: 12/25/2015 Document Reviewed: 01/20/2015 Elsevier Interactive Patient Education  2017 [employer]

## 2017-07-21 ENCOUNTER — Encounter: Payer: Self-pay | Admitting: Obstetrics & Gynecology

## 2017-07-21 ENCOUNTER — Ambulatory Visit (INDEPENDENT_AMBULATORY_CARE_PROVIDER_SITE_OTHER): Payer: Medicaid Other | Admitting: Obstetrics & Gynecology

## 2017-07-21 DIAGNOSIS — Z3202 Encounter for pregnancy test, result negative: Secondary | ICD-10-CM

## 2017-07-21 DIAGNOSIS — Z30430 Encounter for insertion of intrauterine contraceptive device: Secondary | ICD-10-CM

## 2017-07-21 LAB — POCT PREGNANCY, URINE: Preg Test, Ur: NEGATIVE

## 2017-07-21 MED ORDER — LEVONORGESTREL 19.5 MCG/DAY IU IUD
INTRAUTERINE_SYSTEM | Freq: Once | INTRAUTERINE | Status: AC
  Administered 2017-07-21: 14:00:00 via INTRAUTERINE

## 2017-07-21 NOTE — Progress Notes (Signed)
Subjective:     Sandra HelperChantal Gough is a 21 y.o. female who presents for a postpartum visit. She is five weeks  postpartum following a vaginal  delivery: I have fully reviewed the prenatal and intrapartum course. The delivery was at [redacted] weeks gestational weeks. Outcome: vaginal . Anesthesia: none. Postpartum course has been uncomplicated. Baby's course has been uncomplicated. Baby is feeding by breast. Bleeding not at this time. Bowel function is normal. Bladder function is normal. Patient is not sexually active. Contraception method is nothing at this time but, desires IUD. Postpartum depression screening: negative  The following portions of the patient's history were reviewed and updated as appropriate: allergies, current medications, past family history, past medical history, past social history, past surgical history and problem list.  Review of Systems Pertinent items are noted in HPI.   Objective:  BP 119/68   Pulse (!) 101   Wt 168 lb 3.4 oz (76.3 kg)   BMI 29.80 kg/m  GYNECOLOGY CLINIC PROCEDURE NOTE  IUD Insertion Procedure Note Patient identified, informed consent performed.  Discussed risks of irregular bleeding, cramping, infection, malpositioning or misplacement of the IUD outside the uterus which may require further procedures. Time out was performed.  Urine pregnancy test negative.  Speculum placed in the vagina.  Cervix visualized.  Cleaned with Betadine x 2.  Grasped anteriorly with a single tooth tenaculum.  Uterus sounded to 3 cm.  Mirena IUD placed per manufacturer's recommendations.  Strings trimmed to 3 cm. Tenaculum was removed, good hemostasis noted.  Patient tolerated procedure well.    Assessment:   5 weeks postpartum exam. Pap smear not done at today's visit.  IUD insertion  Plan:    1. Contraception: IUD 2. Follow up in: 4 weeks or as needed.    Ransome Helwig L. Harraway-Smith, M.D., Evern CoreFACOG

## 2017-08-09 ENCOUNTER — Encounter: Payer: Self-pay | Admitting: *Deleted

## 2017-08-18 ENCOUNTER — Encounter: Payer: Self-pay | Admitting: Obstetrics & Gynecology

## 2017-08-18 ENCOUNTER — Ambulatory Visit (INDEPENDENT_AMBULATORY_CARE_PROVIDER_SITE_OTHER): Payer: Medicaid Other | Admitting: Obstetrics & Gynecology

## 2017-08-18 VITALS — BP 116/64 | HR 86 | Wt 162.5 lb

## 2017-08-18 DIAGNOSIS — R102 Pelvic and perineal pain: Secondary | ICD-10-CM

## 2017-08-18 DIAGNOSIS — Z30431 Encounter for routine checking of intrauterine contraceptive device: Secondary | ICD-10-CM

## 2017-08-18 LAB — POCT URINALYSIS DIP (DEVICE)
BILIRUBIN URINE: NEGATIVE
Glucose, UA: NEGATIVE mg/dL
Hgb urine dipstick: NEGATIVE
Ketones, ur: NEGATIVE mg/dL
LEUKOCYTES UA: NEGATIVE
NITRITE: NEGATIVE
Protein, ur: NEGATIVE mg/dL
Specific Gravity, Urine: 1.02 (ref 1.005–1.030)
Urobilinogen, UA: 0.2 mg/dL (ref 0.0–1.0)
pH: 8 (ref 5.0–8.0)

## 2017-08-18 MED ORDER — IBUPROFEN 600 MG PO TABS: 600.0000 mg | ORAL_TABLET | Freq: Four times a day (QID) | ORAL | 1 refills | Status: DC | PRN

## 2017-08-18 MED ORDER — IBUPROFEN 600 MG PO TABS: 600.0000 mg | ORAL_TABLET | Freq: Four times a day (QID) | ORAL | 0 refills | Status: DC

## 2017-08-18 NOTE — Progress Notes (Signed)
States IUd is causing her some pain.

## 2017-08-18 NOTE — Addendum Note (Signed)
Addended by: Willodean RosenthalHARRAWAY-SMITH, Jaron Czarnecki on: 08/18/2017 04:25 PM   Modules accepted: Orders

## 2017-08-18 NOTE — Progress Notes (Signed)
History:  22 y.o. [id number] here today for today for IUD string check; Mirena IUD was placed  07/21/2017. Pt reports that she thinks that she feels the IUD. She is worried about having sex due to the presecne of the IUD. She does c/o some mild pain.   The following portions of the patient's history were reviewed and updated as appropriate: allergies, current medications, past family history, past medical history, past social history, past surgical history and problem list.    Review of Systems:  Pertinent items are noted in HPI.   Objective:  Physical Exam Blood pressure 116/64, pulse 86, weight 162 lb 8 oz (73.7 kg), unknown if currently breastfeeding.  CONSTITUTIONAL: Well-developed, well-nourished female in no acute distress.  HENT:  Normocephalic, atraumatic EYES: Conjunctivae and EOM are normal. No scleral icterus.  NECK: Normal range of motion SKIN: Skin is warm and dry. No rash noted. Not diaphoretic.No pallor. NEUROLGIC: Alert and oriented to person, place, and time. Normal coordination.  Abd: Soft, nontender and nondistended Pelvic: Normal appearing external genitalia; normal appearing vaginal mucosa and cervix.  IUD strings visualized and trimmed to about 3 cm in length outside cervix.   Assessment & Plan:  Normal IUD check. Patient to keep IUD in place for five years; can come in for removal if she desires pregnancy within the next five years. F/u in 3 months or sooner prn Motrin 600mg  1 po q 6 hours prn pain   Wandalee Klang L. Harraway-Smith, M.D., Evern CoreFACOG

## 2017-12-29 ENCOUNTER — Ambulatory Visit: Payer: Medicaid Other | Admitting: Obstetrics & Gynecology

## 2018-02-13 ENCOUNTER — Ambulatory Visit (INDEPENDENT_AMBULATORY_CARE_PROVIDER_SITE_OTHER): Payer: Medicaid Other | Admitting: Advanced Practice Midwife

## 2018-02-13 ENCOUNTER — Encounter: Payer: Self-pay | Admitting: Advanced Practice Midwife

## 2018-02-13 ENCOUNTER — Other Ambulatory Visit (HOSPITAL_COMMUNITY)
Admission: RE | Admit: 2018-02-13 | Discharge: 2018-02-13 | Disposition: A | Discharge status: Home / Self Care | Payer: Medicaid Other | Source: Ambulatory Visit | Attending: Advanced Practice Midwife | Admitting: Advanced Practice Midwife

## 2018-02-13 VITALS — BP 107/55 | HR 79 | Wt 167.2 lb

## 2018-02-13 DIAGNOSIS — Y848 Other medical procedures as the cause of abnormal reaction of the patient, or of later complication, without mention of misadventure at the time of the procedure: Secondary | ICD-10-CM | POA: Diagnosis not present

## 2018-02-13 DIAGNOSIS — T8384XD Pain from genitourinary prosthetic devices, implants and grafts, subsequent encounter: Secondary | ICD-10-CM

## 2018-02-13 DIAGNOSIS — Z30431 Encounter for routine checking of intrauterine contraceptive device: Secondary | ICD-10-CM

## 2018-02-13 NOTE — Progress Notes (Signed)
labGYNECOLOGY OFFICE PROGRESS NOTE  History:  22 y.o. [id number] here today for today for IUD string check; Liletta IUD was placed  07/21/17. She was seen on 08/18/17, and she was having some pain. She reports that she is still having daily cramping that she associates with the IUD. She has been taking ibuprofen, and it does not help. She rates her pain 6/10 when it occurs. She denies any fever or nausea/vomiting.   The following portions of the patient's history were reviewed and updated as appropriate: allergies, current medications, past family history, past medical history, past social history, past surgical history and problem list. Last pap smear on 11/2016 was ASCUS negative HRHPV.  Review of Systems:  Pertinent items are noted in HPI.   Objective:  Physical Exam Blood pressure (!) 107/55, pulse 79, weight 167 lb 3.2 oz (75.8 kg), unknown if currently breastfeeding. CONSTITUTIONAL: Well-developed, well-nourished female in no acute distress.  HENT:  Normocephalic, atraumatic. External right and left ear normal. Oropharynx is clear and moist EYES: Conjunctivae and EOM are normal. Pupils are equal, round, and reactive to light. No scleral icterus.  NECK: Normal range of motion, supple, no masses CARDIOVASCULAR: Normal heart rate noted RESPIRATORY: Effort and breath sounds normal, no problems with respiration noted ABDOMEN: Soft, no distention noted.   PELVIC: Normal appearing external genitalia; normal appearing vaginal mucosa and cervix.  IUD strings visualized, about 3 cm in length outside cervix.   Assessment & Plan:   1. Pain due to intrauterine contraceptive device (IUD), subsequent encounter    GC/CT culture today US pelvis, complete scheduled FU PRN   Thressa ShellerHeather Hogan 3:38 PM 02/13/18

## 2018-02-14 LAB — CERVICOVAGINAL ANCILLARY ONLY
BACTERIAL VAGINITIS: NEGATIVE
CANDIDA VAGINITIS: NEGATIVE
CHLAMYDIA, DNA PROBE: NEGATIVE
Neisseria Gonorrhea: NEGATIVE
TRICH (WINDOWPATH): NEGATIVE

## 2018-02-20 ENCOUNTER — Ambulatory Visit (HOSPITAL_COMMUNITY): Payer: Medicaid Other

## 2018-02-20 ENCOUNTER — Ambulatory Visit (HOSPITAL_COMMUNITY)
Admission: RE | Admit: 2018-02-20 | Discharge: 2018-02-20 | Disposition: A | Discharge status: Home / Self Care | Payer: Medicaid Other | Source: Ambulatory Visit | Attending: Advanced Practice Midwife | Admitting: Advanced Practice Midwife

## 2018-02-20 DIAGNOSIS — Y848 Other medical procedures as the cause of abnormal reaction of the patient, or of later complication, without mention of misadventure at the time of the procedure: Secondary | ICD-10-CM | POA: Insufficient documentation

## 2018-02-20 DIAGNOSIS — R102 Pelvic and perineal pain: Secondary | ICD-10-CM | POA: Insufficient documentation

## 2018-02-20 DIAGNOSIS — T8384XD Pain from genitourinary prosthetic devices, implants and grafts, subsequent encounter: Secondary | ICD-10-CM | POA: Diagnosis not present

## 2018-03-07 ENCOUNTER — Telehealth: Payer: Self-pay | Admitting: Family Medicine

## 2018-03-15 ENCOUNTER — Emergency Department (HOSPITAL_COMMUNITY)
Admission: EM | Admit: 2018-03-15 | Discharge: 2018-03-15 | Disposition: A | Discharge status: Home / Self Care | Payer: Medicaid Other | Attending: Emergency Medicine | Admitting: Emergency Medicine

## 2018-03-15 ENCOUNTER — Emergency Department (HOSPITAL_COMMUNITY): Payer: Medicaid Other

## 2018-03-15 ENCOUNTER — Encounter (HOSPITAL_COMMUNITY): Payer: Self-pay

## 2018-03-15 DIAGNOSIS — R0789 Other chest pain: Secondary | ICD-10-CM | POA: Insufficient documentation

## 2018-03-15 DIAGNOSIS — H5711 Ocular pain, right eye: Secondary | ICD-10-CM

## 2018-03-15 DIAGNOSIS — R109 Unspecified abdominal pain: Secondary | ICD-10-CM | POA: Diagnosis not present

## 2018-03-15 DIAGNOSIS — T07XXXA Unspecified multiple injuries, initial encounter: Secondary | ICD-10-CM | POA: Diagnosis not present

## 2018-03-15 DIAGNOSIS — S299XXA Unspecified injury of thorax, initial encounter: Secondary | ICD-10-CM | POA: Diagnosis not present

## 2018-03-15 DIAGNOSIS — Z79899 Other long term (current) drug therapy: Secondary | ICD-10-CM | POA: Insufficient documentation

## 2018-03-15 DIAGNOSIS — S0993XA Unspecified injury of face, initial encounter: Secondary | ICD-10-CM | POA: Diagnosis not present

## 2018-03-15 DIAGNOSIS — S0990XA Unspecified injury of head, initial encounter: Secondary | ICD-10-CM | POA: Diagnosis not present

## 2018-03-15 DIAGNOSIS — R1084 Generalized abdominal pain: Secondary | ICD-10-CM

## 2018-03-15 DIAGNOSIS — M25512 Pain in left shoulder: Secondary | ICD-10-CM | POA: Diagnosis not present

## 2018-03-15 DIAGNOSIS — S3991XA Unspecified injury of abdomen, initial encounter: Secondary | ICD-10-CM | POA: Diagnosis not present

## 2018-03-15 DIAGNOSIS — R079 Chest pain, unspecified: Secondary | ICD-10-CM | POA: Diagnosis not present

## 2018-03-15 DIAGNOSIS — S199XXA Unspecified injury of neck, initial encounter: Secondary | ICD-10-CM | POA: Diagnosis not present

## 2018-03-15 LAB — I-STAT CHEM 8, ED
BUN: 8 mg/dL (ref 6–20)
CREATININE: 0.5 mg/dL (ref 0.44–1.00)
Calcium, Ion: 1.14 mmol/L — ABNORMAL LOW (ref 1.15–1.40)
Chloride: 105 mmol/L (ref 98–111)
Glucose, Bld: 100 mg/dL — ABNORMAL HIGH (ref 70–99)
HEMATOCRIT: 39 % (ref 36.0–46.0)
HEMOGLOBIN: 13.3 g/dL (ref 12.0–15.0)
POTASSIUM: 4.2 mmol/L (ref 3.5–5.1)
Sodium: 140 mmol/L (ref 135–145)
TCO2: 26 mmol/L (ref 22–32)

## 2018-03-15 LAB — I-STAT BETA HCG BLOOD, ED (MC, WL, AP ONLY)

## 2018-03-15 MED ORDER — OXYCODONE-ACETAMINOPHEN 5-325 MG PO TABS
1.0000 | ORAL_TABLET | Freq: Once | ORAL | Status: AC
  Administered 2018-03-15: 1 via ORAL
  Filled 2018-03-15: qty 1

## 2018-03-15 MED ORDER — ERYTHROMYCIN 5 MG/GM OP OINT: TOPICAL_OINTMENT | OPHTHALMIC | 0 refills | Status: DC

## 2018-03-15 MED ORDER — TETRACAINE HCL 0.5 % OP SOLN
2.0000 [drp] | Freq: Once | OPHTHALMIC | Status: AC
  Administered 2018-03-15: 2 [drp] via OPHTHALMIC
  Filled 2018-03-15: qty 4

## 2018-03-15 MED ORDER — METHOCARBAMOL 500 MG PO TABS: 500.0000 mg | ORAL_TABLET | Freq: Two times a day (BID) | ORAL | 0 refills | Status: DC

## 2018-03-15 MED ORDER — FLUORESCEIN SODIUM 1 MG OP STRP
1.0000 | ORAL_STRIP | Freq: Once | OPHTHALMIC | Status: AC
  Administered 2018-03-15: 1 via OPHTHALMIC
  Filled 2018-03-15: qty 1

## 2018-03-15 MED ORDER — IOHEXOL 300 MG/ML  SOLN
100.0000 mL | Freq: Once | INTRAMUSCULAR | Status: AC | PRN
  Administered 2018-03-15: 100 mL via INTRAVENOUS

## 2018-03-15 MED ORDER — SODIUM CHLORIDE 0.9 % IV BOLUS
1000.0000 mL | Freq: Once | INTRAVENOUS | Status: AC
  Administered 2018-03-15: 1000 mL via INTRAVENOUS

## 2018-03-15 NOTE — ED Notes (Signed)
ED Provider at bedside. 

## 2018-03-15 NOTE — Discharge Instructions (Signed)
Your work-up has been reassuring in the emergency department today.  This is likely musculoskeletal pain.  Would recommend taking Motrin and Tylenol at home for pain. Please the the robaxin for muscle relaxation. This medication will make you drowsy so avoid situation that could place you in danger.  Have given you erythromycin ointment to using her right eye as prescribed.  Have given you ophthalmology follow-up.  Return the ED with worsening symptoms.

## 2018-03-15 NOTE — ED Notes (Signed)
Patient transported to CT 
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
## 2018-07-10 IMAGING — US US MFM OB FOLLOW-UP
1 series · 14 of 28 positions shown · non-contrast
Comparison: none

[Series 1: us mfm ob follow-up · 31 acquisitions, 14 frames shown]
[im 2/31]
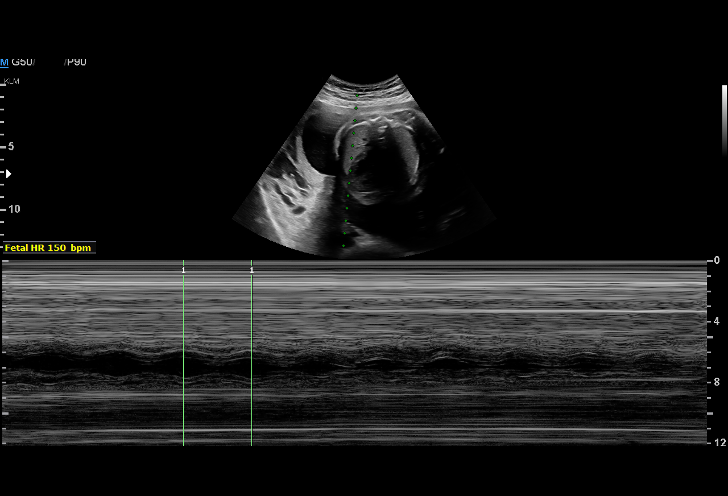
[im 4/31]
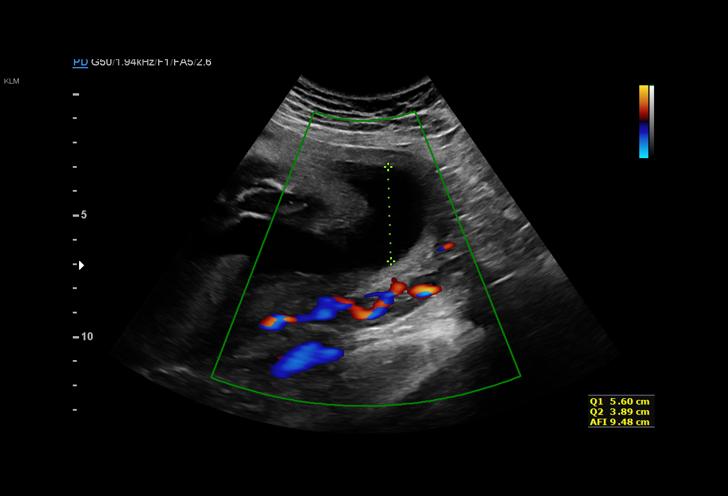
[im 6/31]
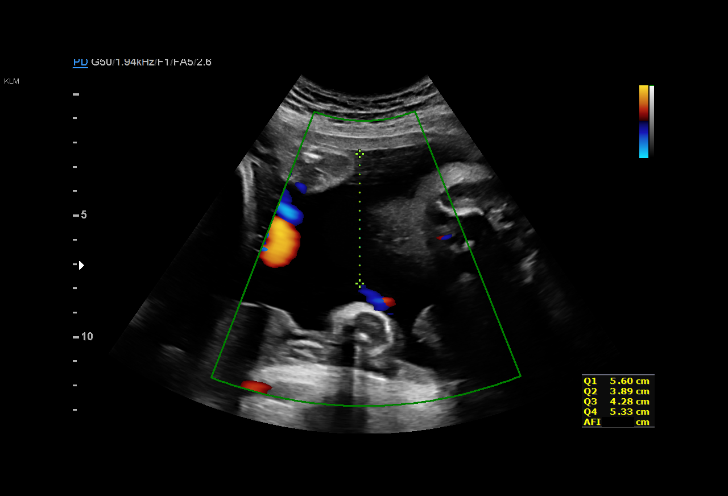
[im 8/31]
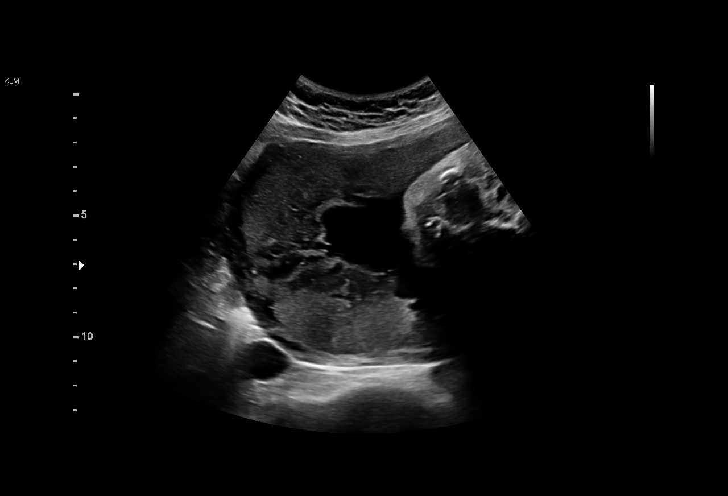
[im 11/31]
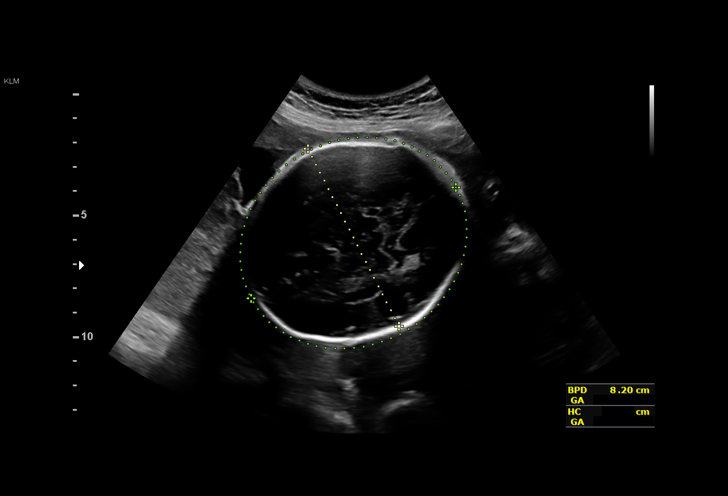
[im 13/31]
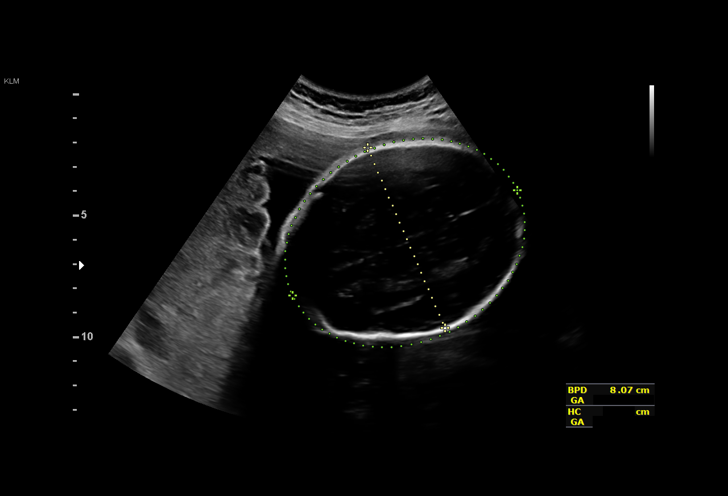
[im 15/31]
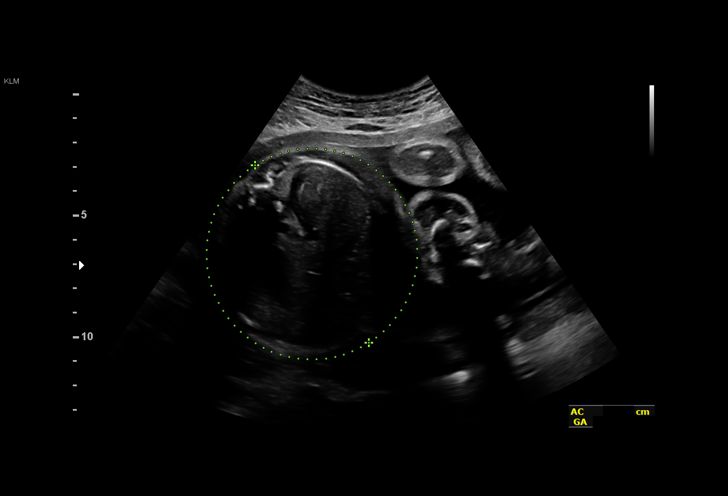
[im 17/31]
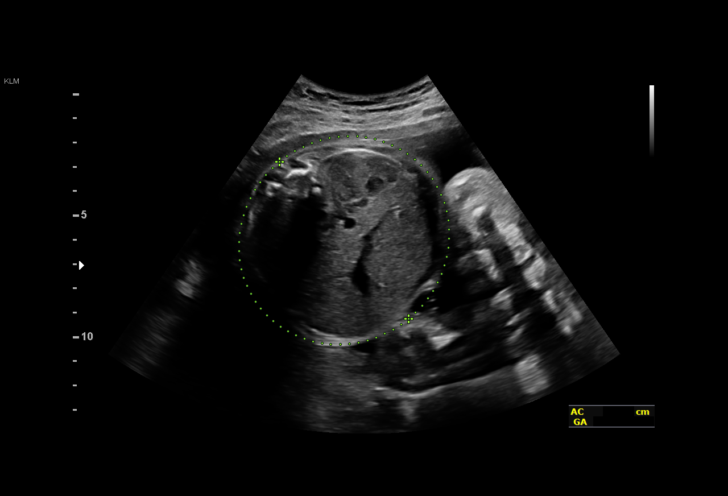
[im 19/31]
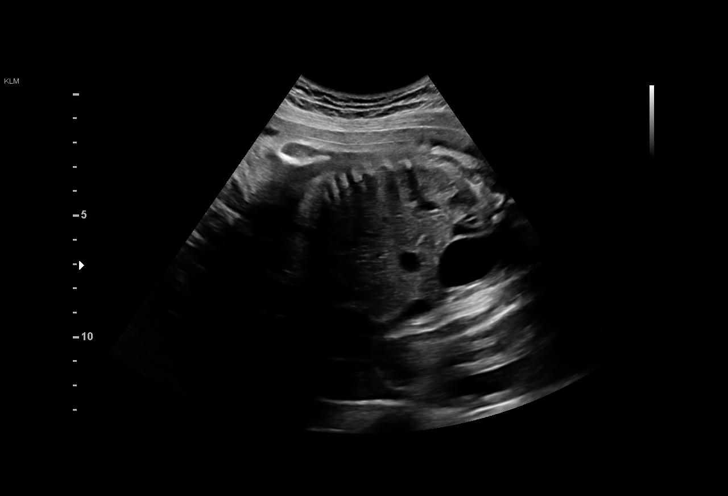
[im 22/31]
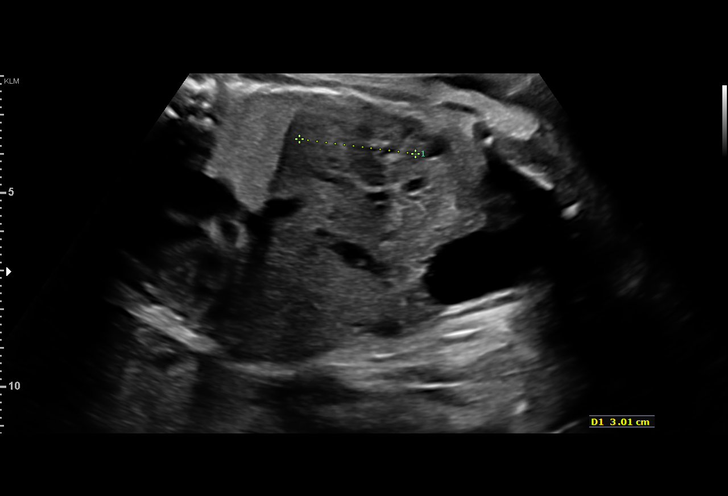
[im 24/31]
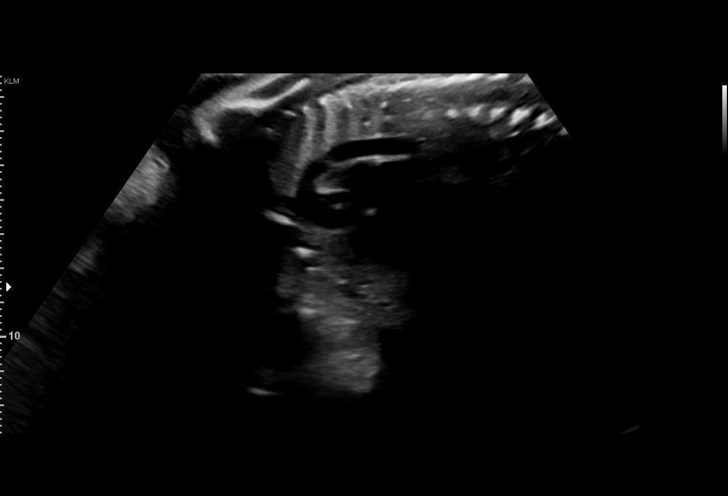
[im 26/31]
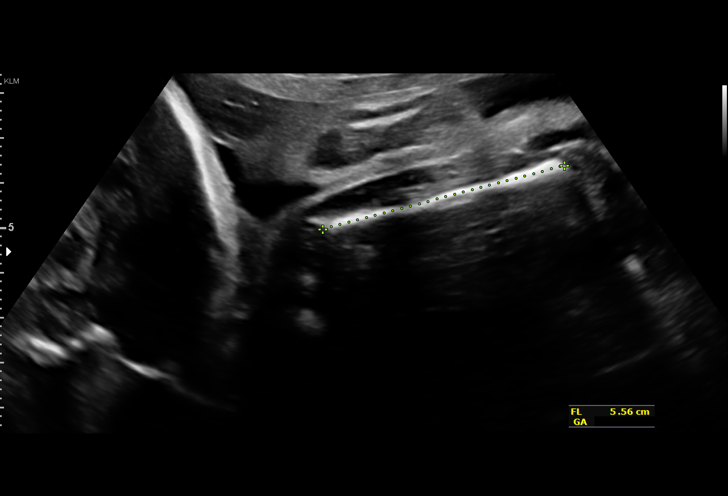
[im 28/31]
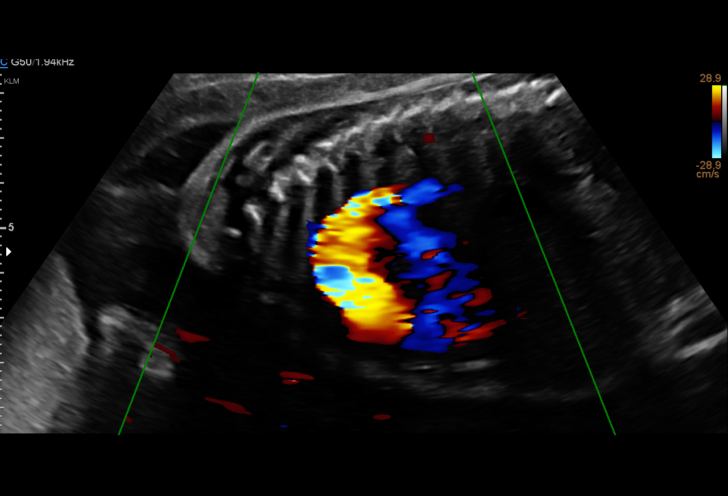
[im 31/31]
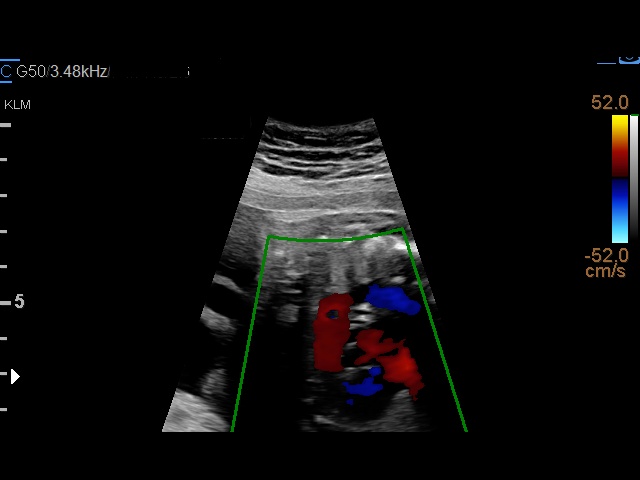

[14 of 28 positions shown; findings below may reference images not displayed]

OB/Gyn Clinic

1  NOMASIBULELE MOATSHE           [id number]      [id number]     [id number]
Indications

30 weeks gestation of pregnancy
Short interval between pregancies, 3rd
trimester
Antenatal follow-up for nonvisualized fetal
anatomy
OB History

Gravidity:    3         Term:   2        Prem:   0        SAB:   0
TOP:          0       Ectopic:  0        Living: 2
Fetal Evaluation

Num Of Fetuses:     1
Fetal Heart         150
Rate(bpm):
Cardiac Activity:   Observed
Presentation:       Breech
Placenta:           Posterior Fundal, above cervical os
P. Cord Insertion:  Previously Visualized

Amniotic Fluid
AFI FV:      Subjectively within normal limits

AFI Sum(cm)     %Tile       Largest Pocket(cm)
19.1            73

RUQ(cm)       RLQ(cm)       LUQ(cm)        LLQ(cm)
5.6
Biometry

BPD:      81.1  mm     G. Age:  32w 4d         89  %    CI:        80.14   %   70 - 86
FL/HC:      19.7   %   19.3 -
HC:      286.2  mm     G. Age:  31w 3d         34  %    HC/AC:      1.06       0.96 -
AC:       271   mm     G. Age:  31w 1d         60  %    FL/BPD:     69.4   %   71 - 87
FL:       56.3  mm     G. Age:  29w 4d         11  %    FL/AC:      20.8   %   20 - 24

Est. FW:    7046  gm    3 lb 10 oz      57  %
Gestational Age

LMP:           31w 5d       Date:   08/19/16                 EDD:   05/26/17
U/S Today:     31w 1d                                        EDD:   05/30/17
Best:          30w 5d    Det. By:   U/S  (02/04/17)          EDD:   06/02/17
Anatomy

Cranium:               Appears normal         Aortic Arch:            Appears normal
Cavum:                 Appears normal         Ductal Arch:            Appears normal
Ventricles:            Appears normal         Diaphragm:              Previously seen
Choroid Plexus:        Previously seen        Stomach:                Appears normal, left
sided
Cerebellum:            Previously seen        Abdomen:                Appears normal
Posterior Fossa:       Previously seen        Abdominal Wall:         Previously seen
Nuchal Fold:           Not applicable (>20    Cord Vessels:           Previously seen
wks GA)
Face:                  Orbits and profile     Kidneys:                Appear normal
previously seen
Lips:                  Previously seen        Bladder:                Appears normal
Thoracic:              Appears normal         Spine:                  Previously seen
Heart:                 Previously seen        Upper Extremities:      Previously seen
RVOT:                  Previously seen        Lower Extremities:      Previously seen
LVOT:                  Previously seen

Other:  Fetus appears to be a male. Heels and 5th digit previously visualized.
Nasal bone previously visualized. Open hands previously visualized.
Cervix Uterus Adnexa

Cervix
Not visualized (advanced GA >69wks)
Impression

Singleton intrauterine pregnancy at 30+5 weeks, here to
complete anatomic survey
Interval review of the anatomy shows no sonographic
markers for aneuploidy or structural anomalies
All relevant anatomy has been visualized
Amniotic fluid volume is normal
Estimated fetal weight is 1647g which is growth in the 57th
percentile
Recommendations

Follow-up ultrasounds as clinically indicated.

## 2018-08-28 ENCOUNTER — Other Ambulatory Visit (HOSPITAL_COMMUNITY)
Admission: RE | Admit: 2018-08-28 | Discharge: 2018-08-28 | Disposition: A | Discharge status: Home / Self Care | Payer: Medicaid Other | Source: Ambulatory Visit | Attending: Advanced Practice Midwife | Admitting: Advanced Practice Midwife

## 2018-08-28 ENCOUNTER — Ambulatory Visit (INDEPENDENT_AMBULATORY_CARE_PROVIDER_SITE_OTHER): Payer: Medicaid Other | Admitting: Advanced Practice Midwife

## 2018-08-28 VITALS — BP 129/84 | HR 112 | Wt 170.4 lb

## 2018-08-28 DIAGNOSIS — Z0000 Encounter for general adult medical examination without abnormal findings: Secondary | ICD-10-CM

## 2018-08-28 DIAGNOSIS — Z975 Presence of (intrauterine) contraceptive device: Secondary | ICD-10-CM

## 2018-08-28 DIAGNOSIS — Z01419 Encounter for gynecological examination (general) (routine) without abnormal findings: Secondary | ICD-10-CM

## 2018-08-28 DIAGNOSIS — Z8742 Personal history of other diseases of the female genital tract: Secondary | ICD-10-CM

## 2018-08-28 NOTE — Patient Instructions (Signed)
Pap Test  Why am I having this test?  A Pap test, also called a Pap smear, is a screening test to check for signs of:  · Cancer of the vagina, cervix, and uterus. The cervix is the lower part of the uterus that opens into the vagina.  · Infection.  · Changes that may be a sign that cancer is developing (precancerous changes).  Women need this test on a regular basis. In general, you should have a Pap test every 3 years until you reach menopause or age 24. Women aged 30-60 may choose to have their Pap test done at the same time as an HPV (human papillomavirus) test every 5 years (instead of every 3 years).  Your health care provider may recommend having Pap tests more or less often depending on your medical conditions and past Pap test results.  What kind of sample is taken?    Your health care provider will collect a sample of cells from the surface of your cervix. This will be done using a small cotton swab, plastic spatula, or brush. This sample is often collected during a pelvic exam, when you are lying on your back on an exam table with feet in footrests (stirrups).  In some cases, fluids (secretions) from the cervix or vagina may also be collected.  How do I prepare for this test?  · Be aware of where you are in your menstrual cycle. If you are menstruating on the day of the test, you may be asked to reschedule.  · You may need to reschedule if you have a known vaginal infection on the day of the test.  · Follow instructions from your health care provider about:  ? Changing or stopping your regular medicines. Some medicines can cause abnormal test results, such as digitalis and tetracycline.  ? Avoiding douching or taking a bath the day before or the day of the test.  Tell a health care provider about:  · Any allergies you have.  · All medicines you are taking, including vitamins, herbs, eye drops, creams, and over-the-counter medicines.  · Any blood disorders you have.  · Any surgeries you have had.  · Any  medical conditions you have.  · Whether you are pregnant or may be pregnant.  How are the results reported?  Your test results will be reported as either abnormal or normal.  A false-positive result can occur. A false positive is incorrect because it means that a condition is present when it is not.  A false-negative result can occur. A false negative is incorrect because it means that a condition is not present when it is.  What do the results mean?  A normal test result means that you do not have signs of cancer of the vagina, cervix, or uterus.  An abnormal result may mean that you have:  · Cancer. A Pap test by itself is not enough to diagnose cancer. You will have more tests done in this case.  · Precancerous changes in your vagina, cervix, or uterus.  · Inflammation of the cervix.  · An STD (sexually transmitted disease).  · A fungal infection.  · A parasite infection.  Talk with your health care provider about what your results mean.  Questions to ask your health care provider  Ask your health care provider, or the department that is doing the test:  · When will my results be ready?  · How will I get my results?  · What are my   treatment options?  · What other tests do I need?  · What are my next steps?  Summary  · In general, women should have a Pap test every 3 years until they reach menopause or age 24.  · Your health care provider will collect a sample of cells from the surface of your cervix. This will be done using a small cotton swab, plastic spatula, or brush.  · In some cases, fluids (secretions) from the cervix or vagina may also be collected.  This information is not intended to replace advice given to you by your health care provider. Make sure you discuss any questions you have with your health care provider.  Document Released: 10/09/2002 Document Revised: 03/28/2017 Document Reviewed: 03/28/2017  Elsevier Interactive Patient Education © 2019 [employer]

## 2018-08-28 NOTE — Progress Notes (Signed)
GYNECOLOGY ANNUAL PREVENTATIVE CARE ENCOUNTER NOTE  Subjective:   Sandra Sexton is a 23 y.o. G41P3003 female here for a routine annual gynecologic exam.  Current complaints: none.   Denies abnormal vaginal bleeding, discharge, pelvic pain, problems with intercourse or other gynecologic concerns.    Gynecologic History No LMP recorded. not getting a period with IUD  Contraception: IUD Last Pap: 12/08/2016. Results were: ASCUS, negative HPV Last mammogram: NA. Results were: NA Pelvic US on 02/20/18 was normal and IUD in position  Patient still having frequent abdominal pain.   Obstetric History OB History  Gravida Para Term Preterm AB Living  3 3 3     3   SAB TAB Ectopic Multiple Live Births        0 3    # Outcome Date GA Lbr Len/2nd Weight Sex Delivery Anes PTL Lv  3 Term 06/08/17 [redacted]w[redacted]d 02:20 7 lb 12 oz (3.515 kg) M Vag-Spont None  LIV     Birth Comments: wnl  2 Term 11/15/15 [redacted]w[redacted]d 07:42 / 00:03 7 lb 6 oz (3.345 kg) F Vag-Spont Other, None  LIV     Birth Comments: none  1 Term 11/03/13    F Vag-Spont  N LIV    Past Medical History:  Diagnosis Date  . H/O malaria 2015   [place]    Past Surgical History:  Procedure Laterality Date  . NO PAST SURGERIES      Current Outpatient Medications on File Prior to Visit  Medication Sig Dispense Refill  . erythromycin ophthalmic ointment Place a 1/2 inch ribbon of ointment into the lower eyelid 4 times per day for 5 days. (Patient not taking: Reported on 08/28/2018) 1 g 0  . methocarbamol (ROBAXIN) 500 MG tablet Take 1 tablet (500 mg total) by mouth 2 (two) times daily. (Patient not taking: Reported on 08/28/2018) 10 tablet 0   No current facility-administered medications on file prior to visit.     No Known Allergies  Social History   Socioeconomic History  . Marital status: Married    Spouse name: Not on file  . Number of children: Not on file  . Years of education: Not on file  . Highest education  level: Not on file  Occupational History  . Not on file  Social Needs  . Financial resource strain: Not on file  . Food insecurity:    Worry: Not on file    Inability: Not on file  . Transportation needs:    Medical: Not on file    Non-medical: Not on file  Tobacco Use  . Smoking status: Never Smoker  . Smokeless tobacco: Never Used  Substance and Sexual Activity  . Alcohol use: No  . Drug use: No  . Sexual activity: Yes    Birth control/protection: I.U.D.  Lifestyle  . Physical activity:    Days per week: Not on file    Minutes per session: Not on file  . Stress: Not on file  Relationships  . Social connections:    Talks on phone: Not on file    Gets together: Not on file    Attends religious service: Not on file    Active member of club or organization: Not on file    Attends meetings of clubs or organizations: Not on file    Relationship status: Not on file  . Intimate partner violence:    Fear of current or ex partner: Not on file    Emotionally abused: Not on  file    Physically abused: Not on file    Forced sexual activity: Not on file  Other Topics Concern  . Not on file  Social History Narrative  . Not on file    No family history on file.  The following portions of the patient's history were reviewed and updated as appropriate: allergies, current medications, past family history, past medical history, past social history, past surgical history and problem list.  Review of Systems Pertinent items noted in HPI and remainder of comprehensive ROS otherwise negative.   Objective:  BP 129/84   Pulse (!) 112   Wt 170 lb 6.4 oz (77.3 kg)   BMI 30.19 kg/m  CONSTITUTIONAL: Well-developed, well-nourished female in no acute distress.  HENT:  Normocephalic, atraumatic, External right and left ear normal. Oropharynx is clear and moist EYES: Conjunctivae and EOM are normal. Pupils are equal, round, and reactive to light. No scleral icterus.  NECK: Normal range of  motion, supple, no masses.  Normal thyroid.  SKIN: Skin is warm and dry. No rash noted. Not diaphoretic. No erythema. No pallor. NEUROLOGIC: Alert and oriented to person, place, and time. Normal reflexes, muscle tone coordination. No cranial nerve deficit noted. PSYCHIATRIC: Normal mood and affect. Normal behavior. Normal judgment and thought content. CARDIOVASCULAR: Normal heart rate noted, regular rhythm RESPIRATORY: Clear to auscultation bilaterally. Effort and breath sounds normal, no problems with respiration noted. BREASTS: Symmetric in size. No masses, skin changes, nipple drainage, or lymphadenopathy. ABDOMEN: Soft, normal bowel sounds, no distention noted.  No tenderness, rebound or guarding.  PELVIC: Normal appearing external genitalia; normal appearing vaginal mucosa and cervix.  No abnormal discharge noted.  Pap smear obtained.  Normal uterine size, no other palpable masses, no uterine or adnexal tenderness. MUSCULOSKELETAL: Normal range of motion. No tenderness.  No cyanosis, clubbing, or edema.  2+ distal pulses.   Assessment and Plan:  1. Well woman exam with routine gynecological exam - Cytology - PAP( [place]) - Frequent abdominal pain: reassured patient that pelvic US was normal. Offered referral to GI, but patient does not want to pursue that at this time.   2. History of abnormal cervical Pap smear  3. IUD (intrauterine device) in place   Will follow up results of pap smear and manage accordingly. Routine preventative health maintenance measures emphasized. Please refer to After Visit Summary for other counseling recommendations.   Thressa Sheller DNP, CNM  08/28/18  4:08 PM

## 2018-08-30 LAB — CYTOLOGY - PAP
Chlamydia: NEGATIVE
DIAGNOSIS: REACTIVE
Diagnosis: NEGATIVE
Neisseria Gonorrhea: NEGATIVE

## 2018-10-24 ENCOUNTER — Ambulatory Visit: Payer: Medicaid Other | Admitting: Family Medicine

## 2018-10-25 ENCOUNTER — Emergency Department (HOSPITAL_COMMUNITY): Payer: Medicaid Other

## 2018-10-25 ENCOUNTER — Encounter (HOSPITAL_COMMUNITY): Payer: Self-pay

## 2018-10-25 ENCOUNTER — Emergency Department (HOSPITAL_COMMUNITY)
Admission: EM | Admit: 2018-10-25 | Discharge: 2018-10-25 | Disposition: A | Discharge status: Home / Self Care | Payer: Medicaid Other | Attending: Emergency Medicine | Admitting: Emergency Medicine

## 2018-10-25 ENCOUNTER — Other Ambulatory Visit: Payer: Self-pay

## 2018-10-25 DIAGNOSIS — R102 Pelvic and perineal pain: Secondary | ICD-10-CM | POA: Insufficient documentation

## 2018-10-25 DIAGNOSIS — R51 Headache: Secondary | ICD-10-CM | POA: Diagnosis not present

## 2018-10-25 DIAGNOSIS — N898 Other specified noninflammatory disorders of vagina: Secondary | ICD-10-CM | POA: Diagnosis not present

## 2018-10-25 DIAGNOSIS — R519 Headache, unspecified: Secondary | ICD-10-CM

## 2018-10-25 LAB — I-STAT BETA HCG BLOOD, ED (MC, WL, AP ONLY)

## 2018-10-25 LAB — WET PREP, GENITAL
CLUE CELLS WET PREP: NONE SEEN
SPERM: NONE SEEN
TRICH WET PREP: NONE SEEN
Yeast Wet Prep HPF POC: NONE SEEN

## 2018-10-25 LAB — URINALYSIS, ROUTINE W REFLEX MICROSCOPIC
Bilirubin Urine: NEGATIVE
GLUCOSE, UA: NEGATIVE mg/dL
Hgb urine dipstick: NEGATIVE
KETONES UR: NEGATIVE mg/dL
LEUKOCYTE UA: NEGATIVE
NITRITE: NEGATIVE
PH: 6 (ref 5.0–8.0)
Protein, ur: NEGATIVE mg/dL
SPECIFIC GRAVITY, URINE: 1.008 (ref 1.005–1.030)

## 2018-10-25 MED ORDER — DIPHENHYDRAMINE HCL 50 MG/ML IJ SOLN
25.0000 mg | Freq: Once | INTRAMUSCULAR | Status: AC
  Administered 2018-10-25: 25 mg via INTRAVENOUS
  Filled 2018-10-25: qty 1

## 2018-10-25 MED ORDER — AZITHROMYCIN 250 MG PO TABS
1000.0000 mg | ORAL_TABLET | Freq: Once | ORAL | Status: AC
  Administered 2018-10-25: 1000 mg via ORAL
  Filled 2018-10-25: qty 4

## 2018-10-25 MED ORDER — SODIUM CHLORIDE 0.9 % IV BOLUS
1000.0000 mL | Freq: Once | INTRAVENOUS | Status: AC
  Administered 2018-10-25: 1000 mL via INTRAVENOUS

## 2018-10-25 MED ORDER — STERILE WATER FOR INJECTION IJ SOLN
INTRAMUSCULAR | Status: AC
  Administered 2018-10-25: 23:00:00
  Filled 2018-10-25: qty 10

## 2018-10-25 MED ORDER — CEFTRIAXONE SODIUM 250 MG IJ SOLR
250.0000 mg | Freq: Once | INTRAMUSCULAR | Status: AC
  Administered 2018-10-25: 250 mg via INTRAMUSCULAR
  Filled 2018-10-25: qty 250

## 2018-10-25 MED ORDER — PROCHLORPERAZINE EDISYLATE 10 MG/2ML IJ SOLN
10.0000 mg | Freq: Once | INTRAMUSCULAR | Status: AC
  Administered 2018-10-25: 10 mg via INTRAVENOUS
  Filled 2018-10-25: qty 2

## 2018-10-25 NOTE — ED Notes (Signed)
ED Provider at bedside. 

## 2018-10-25 NOTE — Discharge Instructions (Addendum)
Your CT scan is normal and I am reassured that your headache is improving.  You can take ibuprofen or Tylenol at home as needed if headache returns, if you continue getting headaches like this please follow-up with your primary care doctor.  Regarding your vaginal pain and discharge, this may be due to a pelvic infection, you were treated with antibiotics here in the emergency department today.  If your symptoms are not improving you will need to follow-up with your gynecologist at the [facility] clinic for further evaluation.  You have testing for sexually transmitted infections that was completed today and you will be called in 2 to 3 days if any of these results are positive, if so your partner will need to be treated as well.

## 2018-10-25 NOTE — ED Provider Notes (Signed)
[facility] Provider Note   CSN: [id number] Arrival date & time: 10/25/18  2023    History   Chief Complaint Chief Complaint  Patient presents with  . Headache    HPI Sandra Sexton is a 23 y.o. female.     [name] is a 23 y.o. female with a history of malaria with prior treatment before migrating to the [place], obesity, who presents to the emergency department for evaluation of headache, also reporting vaginal pain and discharge.  She reports that for the last 4 days she has had a persistent headache that was gradual in onset.  Headache is generalized but most severe behind the eyes.  She reports some occasional blurred vision but no persistent visual changes, no diplopia, light makes headache worse.  No associated dizziness, facial asymmetry, numbness or tingling.  No associated fevers, neck pain or stiffness.  Patient reports one similar headache about a year ago which resolved on its own eventually.  No chest pain, shortness of breath or abdominal pain.  Patient does report some vaginal pain and pain with intercourse and she has noted a watery discharge over the past few days, she is sexually active with one partner.  She does not think she could be pregnant as she has an IUD in place.  She reports she has had this pain with intercourse intermittently since IUD was put in about a year ago.  But does not usually have discharge.  Denies vaginal bleeding.  No dysuria or urinary frequency.  No nausea vomiting, fevers or chills.  She has not followed up with her OB/GYN regarding the symptoms.   Patient is Swahili speaking, interpreter used to obtain history  Past Medical History:  Diagnosis Date  . H/O malaria 2015   [place]    Patient Active Problem List   Diagnosis Date Noted  . SVD (spontaneous vaginal delivery) 06/09/2017  . Supervision of low-risk pregnancy 12/08/2016  . Short interval between pregnancies  affecting pregnancy, antepartum 12/08/2016  . Obesity (BMI 30.0-34.9) 12/08/2016  . Language barrier affecting health care 05/20/2015    Past Surgical History:  Procedure Laterality Date  . NO PAST SURGERIES       OB History    Gravida  3   Para  3   Term  3   Preterm      AB      Living  3     SAB      TAB      Ectopic      Multiple  0   Live Births  3            Home Medications    Prior to Admission medications   Not on File    Family History History reviewed. No pertinent family history.  Social History Social History   Tobacco Use  . Smoking status: Never Smoker  . Smokeless tobacco: Never Used  Substance Use Topics  . Alcohol use: No  . Drug use: No     Allergies   Patient has no known allergies.   Review of Systems Review of Systems  Constitutional: Negative for chills and fever.  HENT: Negative.   Eyes: Positive for photophobia. Negative for visual disturbance.  Respiratory: Negative for cough and shortness of breath.   Cardiovascular: Negative for chest pain.  Gastrointestinal: Negative for abdominal pain, nausea and vomiting.  Genitourinary: Positive for vaginal discharge and vaginal pain. Negative for dysuria, frequency and vaginal  bleeding.  Musculoskeletal: Negative for neck pain and neck stiffness.  Skin: Negative for color change and rash.  Neurological: Positive for headaches. Negative for dizziness, facial asymmetry, weakness, light-headedness and numbness.     Physical Exam Updated Vital Signs BP 123/66   Pulse 75   Temp 99.2 F (37.3 C) (Oral)   Resp 16   Ht  (1.651 m)   Wt 60 kg   SpO2 100%   BMI 22.01 kg/m   Physical Exam Vitals signs and nursing note reviewed. Exam conducted with a chaperone present.  Constitutional:      General: She is not in acute distress.    Appearance: She is well-developed and normal weight. She is not ill-appearing or diaphoretic.  HENT:     Head: Normocephalic and  atraumatic.  Eyes:     General:        Right eye: No discharge.        Left eye: No discharge.     Extraocular Movements: Extraocular movements intact.     Pupils: Pupils are equal, round, and reactive to light.  Neck:     Musculoskeletal: Neck supple. No neck rigidity.     Meningeal: Brudzinski's sign and Kernig's sign absent.  Cardiovascular:     Rate and Rhythm: Normal rate and regular rhythm.     Heart sounds: Normal heart sounds. No murmur. No friction rub. No gallop.   Pulmonary:     Effort: Pulmonary effort is normal. No respiratory distress.     Breath sounds: Normal breath sounds. No wheezing or rales.     Comments: Respirations equal and unlabored, patient able to speak in full sentences, lungs clear to auscultation bilaterally Abdominal:     General: Bowel sounds are normal. There is no distension.     Palpations: Abdomen is soft. There is no mass.     Tenderness: There is no abdominal tenderness. There is no guarding.     Comments: Abdomen soft, nondistended, nontender to palpation in all quadrants without guarding or peritoneal signs  Genitourinary:    Comments: Chaperone present during pelvic exam. No external genital lesions noted. Small amount of thin gray discharge noted in the vaginal vault, cervix with IUD strings present, small amount of erythema present at the cervical os discharge present, no cervical motion tenderness on bimanual exam, no focal adnexal tenderness or masses, no uterine tenderness. Musculoskeletal:        General: No deformity.  Skin:    General: Skin is warm and dry.     Capillary Refill: Capillary refill takes less than 2 seconds.  Neurological:     Mental Status: She is alert.     Coordination: Coordination normal.     Comments: Speech is clear, able to follow commands CN III-XII intact Normal strength in upper and lower extremities bilaterally including dorsiflexion and plantar flexion, strong and equal grip strength Sensation normal to  light and sharp touch Moves extremities without ataxia, coordination intact Normal finger to nose and rapid alternating movements No pronator drift  Psychiatric:        Mood and Affect: Mood normal.        Behavior: Behavior normal.      ED Treatments / Results  Labs (all labs ordered are listed, but only abnormal results are displayed) Labs Reviewed  WET PREP, GENITAL - Abnormal; Notable for the following components:      Result Value   WBC, Wet Prep HPF POC MODERATE (*)    All other components  within normal limits  URINALYSIS, ROUTINE W REFLEX MICROSCOPIC - Abnormal; Notable for the following components:   Color, Urine STRAW (*)    All other components within normal limits  RPR  HIV ANTIBODY (ROUTINE TESTING W REFLEX)  I-STAT BETA HCG BLOOD, ED ([facility], [facility], [facility] ONLY)  GC/CHLAMYDIA PROBE AMP ([place]) NOT AT [facility]    EKG None  Radiology Ct Head Wo Contrast  Result Date: 10/25/2018 CLINICAL DATA:  23 year old female with headache and dizziness. No known injury. EXAM: CT HEAD WITHOUT CONTRAST TECHNIQUE: Contiguous axial images were obtained from the base of the skull through the vertex without intravenous contrast. COMPARISON:  Head CT dated 03/15/2018. FINDINGS: Brain: No evidence of acute infarction, hemorrhage, hydrocephalus, extra-axial collection or mass lesion/mass effect. Vascular: No hyperdense vessel or unexpected calcification. Skull: Normal. Negative for fracture or focal lesion. Sinuses/Orbits: No acute finding. Other: None IMPRESSION: Normal noncontrast CT of the brain. Electronically Signed   By: Elgie Collard M.D.   On: 10/25/2018 22:00    Procedures Procedures (including critical care time)  Medications Ordered in ED Medications  cefTRIAXone (ROCEPHIN) injection 250 mg (has no administration in time range)  sterile water (preservative free) injection (has no administration in time range)  prochlorperazine (COMPAZINE) injection 10 mg (10 mg Intravenous Given  10/25/18 2218)  diphenhydrAMINE (BENADRYL) injection 25 mg (25 mg Intravenous Given 10/25/18 2213)  sodium chloride 0.9 % bolus 1,000 mL (1,000 mLs Intravenous New Bag/Given 10/25/18 2212)  azithromycin (ZITHROMAX) tablet 1,000 mg (1,000 mg Oral Given 10/25/18 2251)     Initial Impression / Assessment and Plan / ED Course  I have reviewed the triage vital signs and the nursing notes.  Pertinent labs & imaging results that were available during my care of the patient were reviewed by me and considered in my medical decision making (see chart for details).  Patient presents to the emergency department for evaluation of 4 days of headache as well as vaginal pain and discharge.  Patient with intermittent blurred vision reported and photophobia, has had one similar headache previous in life, headache was not sudden in onset, she has normal neurologic exam here and I have low suspicion for [facility].  Patient is not currently pregnant I feel she is low risk for venous sinus thrombosis.  She is not having fevers, neck pain or stiffness, no meningismus on exam, low suspicion for meningitis.  Given severity of headache that is been persistent for 4 days, and language barrier will get CT head but I am reassured by her exam today.  Will give migraine cocktail as well.  Patient also reports vaginal pain and discharge.  Pelvic exam reveals small amount of thin gray discharge, but no cervical motion tenderness or focal adnexal tenderness I have low suspicion for PID and abdominal exam is benign.  STD testing collected.  Wet prep with moderate white blood cells, otherwise unremarkable, urinalysis without signs of infection, given signs of cervicitis will treat prophylactically for gonorrhea and chlamydia, patient is aware she has testing pending will be called with positive results, exam is not concerning for PID.  CT scan shows no evidence of intracranial abnormality.  On reevaluation headache has resolved with  treatment here in the emergency department.  She is ambulatory with steady gait.  At this time I feel she is stable for discharge home, I have stressed the importance of PCP and gynecology follow-up.  Instructions provided with Swahili interpreter.  Patient expresses understanding and agreement with plan.  Discharged home  in good condition.  Final Clinical Impressions(s) / ED Diagnoses   Final diagnoses:  Bad headache  Vaginal discharge  Vaginal pain    ED Discharge Orders    None       Legrand RamsFord, Isaiha Asare N, PA-C 10/26/18 2232    Terrilee FilesButler, Michael C, MD 10/30/18 [phone]

## 2018-10-25 NOTE — ED Triage Notes (Signed)
Pt reports having a headache and having vaginal discharge.

## 2018-10-25 NOTE — ED Notes (Signed)
Nurse starting IV and will get labs. 

## 2018-10-26 LAB — RPR: RPR Ser Ql: NONREACTIVE

## 2018-10-26 LAB — HIV ANTIBODY (ROUTINE TESTING W REFLEX): HIV Screen 4th Generation wRfx: NONREACTIVE

## 2018-10-26 LAB — GC/CHLAMYDIA PROBE AMP (~~LOC~~) NOT AT ARMC
Chlamydia: NEGATIVE
Neisseria Gonorrhea: NEGATIVE

## 2019-02-09 ENCOUNTER — Telehealth: Payer: Self-pay | Admitting: Obstetrics and Gynecology

## 2019-02-09 NOTE — Telephone Encounter (Signed)
Called the patient to complete the pre-screen. The patient answered no to COVID19 symptoms and/or being previously diagnosed. Informed the patient of the wearing a face mask, sanitizing hands at the sanitizing station upon entering our office, and no visitors or children are allowed due to the [place] restrictions. The patient verbalized understanding.   The husband stated he would inform the patient.

## 2019-02-12 ENCOUNTER — Encounter: Payer: Self-pay | Admitting: Family Medicine

## 2019-02-12 ENCOUNTER — Ambulatory Visit (INDEPENDENT_AMBULATORY_CARE_PROVIDER_SITE_OTHER): Payer: Self-pay | Admitting: Family Medicine

## 2019-02-12 ENCOUNTER — Other Ambulatory Visit: Payer: Self-pay

## 2019-02-12 VITALS — BP 121/64 | HR 70 | Temp 98.9°F | Wt 172.7 lb

## 2019-02-12 DIAGNOSIS — R102 Pelvic and perineal pain: Secondary | ICD-10-CM | POA: Insufficient documentation

## 2019-02-12 DIAGNOSIS — Z30431 Encounter for routine checking of intrauterine contraceptive device: Secondary | ICD-10-CM

## 2019-02-12 MED ORDER — DOXYCYCLINE HYCLATE 100 MG PO CAPS: 100.0000 mg | ORAL_CAPSULE | Freq: Two times a day (BID) | ORAL | 0 refills | Status: DC

## 2019-02-12 NOTE — Assessment & Plan Note (Signed)
Maybe related to IUD or chronic endometritis. Will attempt 14 d course of Antibiotics. May want smaller IUD--advised Kyleena vs. Paragard. She was not interested in changing this out today. Discussed other options of nexplanon, depo, OC's, ring, condoms. She will stick with IUD for now.

## 2019-02-12 NOTE — Patient Instructions (Signed)
Pelvic Pain, Female Pelvic pain is pain in your lower belly (abdomen), below your belly button and between your hips. The pain may start suddenly (be acute), keep coming back (be recurring), or last a long time (become chronic). Pelvic pain that lasts longer than 6 months is called chronic pelvic pain. There are many causes of pelvic pain. Sometimes the cause of pelvic pain is not known. Follow these instructions at home:   Take over-the-counter and prescription medicines only as told by your doctor.  Rest as told by your doctor.  Do not have sex if it hurts.  Keep a journal of your pelvic pain. Write down: ? When the pain started. ? Where the pain is located. ? What seems to make the pain better or worse, such as food or your period (menstrual cycle). ? Any symptoms you have along with the pain.  Keep all follow-up visits as told by your doctor. This is important. Contact a doctor if:  Medicine does not help your pain.  Your pain comes back.  You have new symptoms.  You have unusual discharge or bleeding from your vagina.  You have a fever or chills.  You are having trouble pooping (constipation).  You have blood in your pee (urine) or poop (stool).  Your pee smells bad.  You feel weak or light-headed. Get help right away if:  You have sudden pain that is very bad.  Your pain keeps getting worse.  You have very bad pain and also have any of these symptoms: ? A fever. ? Feeling sick to your stomach (nausea). ? Throwing up (vomiting). ? Being very sweaty.  You pass out (lose consciousness). Summary  Pelvic pain is pain in your lower belly (abdomen), below your belly button and between your hips.  There are many possible causes of pelvic pain.  Keep a journal of your pelvic pain. This information is not intended to replace advice given to you by your health care provider. Make sure you discuss any questions you have with your health care provider. Document  Released: 01/05/2008 Document Revised: 01/04/2018 Document Reviewed: 01/04/2018 Elsevier Patient Education  2020 Elsevier Inc.  

## 2019-02-12 NOTE — Progress Notes (Signed)
   Subjective:    Patient ID: Sandra Sexton is a 23 y.o. female presenting with No chief complaint on file.  on 02/12/2019  HPI: Swahili interpreter: Quillian Quince [phone], video used Reports pain which has been there since February of this year. It is worse when she sits. She often wakes up with this pain. Pain is in lower abdomen and worse on right side. Pain is cramping in nature. She has had this pain in the past and noted that she had a normal u/s. States that her IUD was too big when she went to the ED at [facility] had an u/s which showed her IUD in correct position. Also reports that her husband can feel the strings.   Review of Systems  Constitutional: Negative for chills and fever.  Respiratory: Negative for shortness of breath.   Cardiovascular: Negative for chest pain.  Gastrointestinal: Negative for abdominal pain, nausea and vomiting.  Genitourinary: Negative for dysuria.  Skin: Negative for rash.      Objective:    BP 121/64   Pulse 70   Temp 98.9 F (37.2 C)   Wt 172 lb 11.2 oz (78.3 kg)   BMI 28.74 kg/m  Physical Exam Constitutional:      General: She is not in acute distress.    Appearance: She is well-developed.  HENT:     Head: Normocephalic and atraumatic.  Eyes:     General: No scleral icterus. Neck:     Musculoskeletal: Neck supple.  Cardiovascular:     Rate and Rhythm: Normal rate.  Pulmonary:     Effort: Pulmonary effort is normal.  Abdominal:     Palpations: Abdomen is soft.  Genitourinary:    Comments: BUS normal, vagina is pink and rugated, cervix is iparous without lesion, IUD strings tucked into cervix.  Skin:    General: Skin is warm and dry.  Neurological:     Mental Status: She is alert and oriented to person, place, and time.         Assessment & Plan:   Problem List Items Addressed This Visit      Unprioritized   Pelvic pain    Maybe related to IUD or chronic endometritis. Will attempt 14 d course of Antibiotics.  May want smaller IUD--advised Kyleena vs. Paragard. She was not interested in changing this out today. Discussed other options of nexplanon, depo, OC's, ring, condoms. She will stick with IUD for now.       Other Visit Diagnoses    IUD check up    -  Primary      Total face-to-face time with patient: 25 minutes. Over 50% of encounter was spent on counseling and coordination of care. Return in about 4 weeks (around 03/12/2019) for in person.  Donnamae Jude 02/12/2019 3:27 PM

## 2019-03-12 ENCOUNTER — Ambulatory Visit (INDEPENDENT_AMBULATORY_CARE_PROVIDER_SITE_OTHER): Payer: Self-pay | Admitting: Family Medicine

## 2019-03-12 ENCOUNTER — Encounter: Payer: Self-pay | Admitting: Family Medicine

## 2019-03-12 ENCOUNTER — Other Ambulatory Visit: Payer: Self-pay

## 2019-03-12 VITALS — BP 118/74 | HR 99 | Wt 170.4 lb

## 2019-03-12 DIAGNOSIS — Z30432 Encounter for removal of intrauterine contraceptive device: Secondary | ICD-10-CM

## 2019-03-12 DIAGNOSIS — R102 Pelvic and perineal pain: Secondary | ICD-10-CM

## 2019-03-12 DIAGNOSIS — Z30017 Encounter for initial prescription of implantable subdermal contraceptive: Secondary | ICD-10-CM

## 2019-03-12 MED ORDER — ETONOGESTREL-ETHINYL ESTRADIOL 0.12-0.015 MG/24HR VA RING: VAGINAL_RING | VAGINAL | 12 refills | Status: DC

## 2019-03-12 MED ORDER — ETONOGESTREL 68 MG ~~LOC~~ IMPL
68.0000 mg | DRUG_IMPLANT | Freq: Once | SUBCUTANEOUS | Status: AC
  Administered 2019-03-12: 11:00:00 68 mg via SUBCUTANEOUS

## 2019-03-12 NOTE — Patient Instructions (Signed)
Etonogestrel implant What is this medicine? ETONOGESTREL (et oh noe JES trel) is a contraceptive (birth control) device. It is used to prevent pregnancy. It can be used for up to 3 years. This medicine may be used for other purposes; ask your health care provider or pharmacist if you have questions. COMMON BRAND NAME(S): Implanon, Nexplanon What should I tell my health care provider before I take this medicine? They need to know if you have any of these conditions:  abnormal vaginal bleeding  blood vessel disease or blood clots  breast, cervical, endometrial, ovarian, liver, or uterine cancer  diabetes  gallbladder disease  heart disease or recent heart attack  high blood pressure  high cholesterol or triglycerides  kidney disease  liver disease  migraine headaches  seizures  stroke  tobacco smoker  an unusual or allergic reaction to etonogestrel, anesthetics or antiseptics, other medicines, foods, dyes, or preservatives  pregnant or trying to get pregnant  breast-feeding How should I use this medicine? This device is inserted just under the skin on the inner side of your upper arm by a health care professional. Talk to your pediatrician regarding the use of this medicine in children. Special care may be needed. Overdosage: If you think you have taken too much of this medicine contact a poison control center or emergency room at once. NOTE: This medicine is only for you. Do not share this medicine with others. What if I miss a dose? This does not apply. What may interact with this medicine? Do not take this medicine with any of the following medications:  amprenavir  fosamprenavir This medicine may also interact with the following medications:  acitretin  aprepitant  armodafinil  bexarotene  bosentan  carbamazepine  certain medicines for fungal infections like fluconazole, ketoconazole, itraconazole and voriconazole  certain medicines to treat  hepatitis, HIV or AIDS  cyclosporine  felbamate  griseofulvin  lamotrigine  modafinil  oxcarbazepine  phenobarbital  phenytoin  primidone  rifabutin  rifampin  rifapentine  St. John's wort  topiramate This list may not describe all possible interactions. Give your health care provider a list of all the medicines, herbs, non-prescription drugs, or dietary supplements you use. Also tell them if you smoke, drink alcohol, or use illegal drugs. Some items may interact with your medicine. What should I watch for while using this medicine? This product does not protect you against HIV infection (AIDS) or other sexually transmitted diseases. You should be able to feel the implant by pressing your fingertips over the skin where it was inserted. Contact your doctor if you cannot feel the implant, and use a non-hormonal birth control method (such as condoms) until your doctor confirms that the implant is in place. Contact your doctor if you think that the implant may have broken or become bent while in your arm. You will receive a user card from your health care provider after the implant is inserted. The card is a record of the location of the implant in your upper arm and when it should be removed. Keep this card with your health records. What side effects may I notice from receiving this medicine? Side effects that you should report to your doctor or health care professional as soon as possible:  allergic reactions like skin rash, itching or hives, swelling of the face, lips, or tongue  breast lumps, breast tissue changes, or discharge  breathing problems  changes in emotions or moods  if you feel that the implant may have broken or   bent while in your arm  high blood pressure  pain, irritation, swelling, or bruising at the insertion site  scar at site of insertion  signs of infection at the insertion site such as fever, and skin redness, pain or discharge  signs and  symptoms of a blood clot such as breathing problems; changes in vision; chest pain; severe, sudden headache; pain, swelling, warmth in the leg; trouble speaking; sudden numbness or weakness of the face, arm or leg  signs and symptoms of liver injury like dark yellow or brown urine; general ill feeling or flu-like symptoms; light-colored stools; loss of appetite; nausea; right upper belly pain; unusually weak or tired; yellowing of the eyes or skin  unusual vaginal bleeding, discharge Side effects that usually do not require medical attention (report to your doctor or health care professional if they continue or are bothersome):  acne  breast pain or tenderness  headache  irregular menstrual bleeding  nausea This list may not describe all possible side effects. Call your doctor for medical advice about side effects. You may report side effects to FDA at 1-800-FDA-1088. Where should I keep my medicine? This drug is given in a hospital or clinic and will not be stored at home. NOTE: This sheet is a summary. It may not cover all possible information. If you have questions about this medicine, talk to your doctor, pharmacist, or health care provider.  2020 Elsevier/Gold Standard (2017-06-07 14:11:42)  

## 2019-03-12 NOTE — Assessment & Plan Note (Addendum)
IUD pulled today

## 2019-03-12 NOTE — Progress Notes (Signed)
  Swahili interpreter by phone used Subjective:    Patient ID: Sandra Sexton is a 23 y.o. female presenting with No chief complaint on file.  on 03/12/2019  HPI: Here today to follow-up. She was seen 7/14 with pelvic pain since her IUD has been put in. She has not completed her doxycycline.  She is still having pain. It has not improved at all. She has not taken all of her antibiotics. She has had this pain since her IUD was placed. She also feels like this is causing her to gain weight. Does not want to get pregnant right now. Desires IUD removal  Review of Systems  Constitutional: Negative for chills and fever.  Respiratory: Positive for chest tightness. Negative for shortness of breath.   Cardiovascular: Positive for chest pain.  Gastrointestinal: Negative for abdominal pain, nausea and vomiting.  Genitourinary: Negative for dysuria.  Skin: Negative for rash.      Objective:    BP 118/74   Pulse 99   Wt 170 lb 6.4 oz (77.3 kg)   BMI 28.36 kg/m  Physical Exam Constitutional:      General: She is not in acute distress.    Appearance: She is well-developed.  HENT:     Head: Normocephalic and atraumatic.  Eyes:     General: No scleral icterus. Neck:     Musculoskeletal: Neck supple.  Cardiovascular:     Rate and Rhythm: Normal rate.  Pulmonary:     Effort: Pulmonary effort is normal.  Abdominal:     Palpations: Abdomen is soft.  Skin:    General: Skin is warm and dry.  Neurological:     Mental Status: She is alert and oriented to person, place, and time.    Procedure: Speculum placed inside vagina.  Cervix visualized.  Strings grasped with ring forceps.  IUD removed intact.  Procedure: Patient given informed consent, signed copy in the chart, time out was performed.  Appropriate time out taken.  Patient's left arm was prepped and draped in the usual sterile fashion.. The ruler used to measure and mark insertion area.  Pt was prepped with alcohol swab and then  injected with 3 cc of 1% lidocaine with epinephrine.  Pt was prepped with betadine, Nexplanon removed form packaging,  Device confirmed in needle, then inserted full length of needle and withdrawn per handbook instructions.  Pt insertion site covered with 4x4 and Co-band.   Minimal blood loss.  Pt tolerated the procedure well.       Assessment & Plan:   Problem List Items Addressed This Visit      Unprioritized   Pelvic pain - Primary    IUD pulled today       Other Visit Diagnoses    Encounter for IUD removal       Insertion of Nexplanon       Nexplanon x 3 years--side effects discussed at length   Relevant Medications   etonogestrel (NEXPLANON) implant 68 mg (Completed)      Total face-to-face time with patient: 25 minutes. Over 50% of encounter was spent on counseling and coordination of care. Return if symptoms worsen or fail to improve.  Donnamae Jude 03/12/2019 9:52 AM

## 2019-03-13 ENCOUNTER — Encounter: Payer: Self-pay | Admitting: *Deleted

## 2019-04-30 ENCOUNTER — Encounter (HOSPITAL_COMMUNITY): Payer: Self-pay

## 2019-04-30 ENCOUNTER — Other Ambulatory Visit: Payer: Self-pay

## 2019-04-30 ENCOUNTER — Ambulatory Visit (HOSPITAL_COMMUNITY)
Admission: EM | Admit: 2019-04-30 | Discharge: 2019-04-30 | Disposition: A | Discharge status: Home / Self Care | Payer: Self-pay | Attending: Family Medicine | Admitting: Family Medicine

## 2019-04-30 DIAGNOSIS — J309 Allergic rhinitis, unspecified: Secondary | ICD-10-CM

## 2019-04-30 DIAGNOSIS — R51 Headache: Secondary | ICD-10-CM

## 2019-04-30 DIAGNOSIS — R519 Headache, unspecified: Secondary | ICD-10-CM

## 2019-04-30 MED ORDER — DEXAMETHASONE SODIUM PHOSPHATE 10 MG/ML IJ SOLN
10.0000 mg | Freq: Once | INTRAMUSCULAR | Status: AC
  Administered 2019-04-30: 13:00:00 10 mg via INTRAMUSCULAR

## 2019-04-30 MED ORDER — KETOROLAC TROMETHAMINE 30 MG/ML IJ SOLN
30.0000 mg | Freq: Once | INTRAMUSCULAR | Status: AC
  Administered 2019-04-30: 13:00:00 30 mg via INTRAMUSCULAR

## 2019-04-30 MED ORDER — OLOPATADINE HCL 0.2 % OP SOLN: 1.0000 [drp] | Freq: Once | OPHTHALMIC | 1 refills | Status: AC

## 2019-04-30 MED ORDER — DEXAMETHASONE SODIUM PHOSPHATE 10 MG/ML IJ SOLN
INTRAMUSCULAR | Status: AC
  Filled 2019-04-30: qty 1

## 2019-04-30 MED ORDER — KETOROLAC TROMETHAMINE 30 MG/ML IJ SOLN
INTRAMUSCULAR | Status: AC
  Filled 2019-04-30: qty 1

## 2019-04-30 MED ORDER — FLUTICASONE PROPIONATE 50 MCG/ACT NA SUSP: 1.0000 | Freq: Every day | NASAL | 2 refills | Status: DC

## 2019-04-30 NOTE — ED Provider Notes (Signed)
[facility]    CSN: [id number] Arrival date & time: 04/30/19  1136      History   Chief Complaint Chief Complaint  Patient presents with   Eye Pain    HPI Kahdijah Errickson is a 23 y.o. female.   Patient is a 23 year old female that presents today with bilateral eye pain, frontal headache, tearing of the eyes, nasal congestion and rhinorrhea.  Describes some dryness and burning in the eyes.  Symptoms have been constant over the past couple days.  She has been suffering this off and on for over a year.  She is been treated for migraine in the ER before.  Denies any blurred vision or loss of vision.  She has not take anything for symptoms.  Denies any aura, nausea, vomiting, photophobia or phonophobia.  No head injuries, dizziness, weakness or loss consciousness. No eye injury or foreign body in the eyes.   ROS per HPI      Past Medical History:  Diagnosis Date   H/O malaria 2015   [place]    Patient Active Problem List   Diagnosis Date Noted   Pelvic pain 02/12/2019   Language barrier affecting health care 05/20/2015    Past Surgical History:  Procedure Laterality Date   NO PAST SURGERIES      OB History    Gravida  3   Para  3   Term  3   Preterm      AB      Living  3     SAB      TAB      Ectopic      Multiple  0   Live Births  3            Home Medications    Prior to Admission medications   Medication Sig Start Date End Date Taking? Authorizing Provider  fluticasone (FLONASE) 50 MCG/ACT nasal spray Place 1 spray into both nostrils daily. 04/30/19   Loura Halt A, NP  Olopatadine HCl 0.2 % SOLN Apply 1 drop to eye once for 1 dose. 04/30/19 04/30/19  Orvan July, NP    Family History History reviewed. No pertinent family history.  Social History Social History   Tobacco Use   Smoking status: Never Smoker   Smokeless tobacco: Never Used  Substance Use Topics   Alcohol use: No   Drug  use: No     Allergies   Patient has no known allergies.   Review of Systems Review of Systems   Physical Exam Triage Vital Signs ED Triage Vitals  Enc Vitals Group     BP 04/30/19 1200 115/61     Pulse Rate 04/30/19 1200 70     Resp 04/30/19 1200 16     Temp 04/30/19 1200 98.1 F (36.7 C)     Temp Source 04/30/19 1200 Temporal     SpO2 04/30/19 1200 100 %     Weight --      Height --      Head Circumference --      Peak Flow --      Pain Score 04/30/19 1158 8     Pain Loc --      Pain Edu? --      Excl. in [place]? --    No data found.  Updated Vital Signs BP 115/61 (BP Location: Right Arm)    Pulse 70    Temp 98.1 F (36.7 C) (Temporal)  Resp 16    SpO2 100%   Visual Acuity Right Eye Distance:   Left Eye Distance:   Bilateral Distance:    Right Eye Near: R Near: 20/20 without corrective lens Left Eye Near:  L Near: 20/20 without correcitve lens Bilateral Near:  20/20 without corrective lens  Physical Exam Vitals signs and nursing note reviewed.  Constitutional:      General: She is not in acute distress.    Appearance: Normal appearance. She is not ill-appearing, toxic-appearing or diaphoretic.  HENT:     Head: Normocephalic and atraumatic.     Right Ear: Tympanic membrane and ear canal normal.     Left Ear: Tympanic membrane and ear canal normal.     Nose: Nose normal.     Mouth/Throat:     Pharynx: Oropharynx is clear.  Eyes:     Extraocular Movements: Extraocular movements intact.     Pupils: Pupils are equal, round, and reactive to light.     Comments: Bilateral mild scleral injection.   Neck:     Musculoskeletal: Normal range of motion.  Pulmonary:     Effort: Pulmonary effort is normal.  Musculoskeletal: Normal range of motion.  Skin:    General: Skin is warm and dry.  Neurological:     General: No focal deficit present.     Mental Status: She is alert.  Psychiatric:        Mood and Affect: Mood normal.      UC Treatments / Results    Labs (all labs ordered are listed, but only abnormal results are displayed) Labs Reviewed - No data to display  EKG   Radiology No results found.  Procedures Procedures (including critical care time)  Medications Ordered in UC Medications  ketorolac (TORADOL) 30 MG/ML injection 30 mg (30 mg Intramuscular Given 04/30/19 1245)  dexamethasone (DECADRON) injection 10 mg (10 mg Intramuscular Given 04/30/19 1245)  ketorolac (TORADOL) 30 MG/ML injection (has no administration in time range)  dexamethasone (DECADRON) 10 MG/ML injection (has no administration in time range)    Initial Impression / Assessment and Plan / UC Course  I have reviewed the triage vital signs and the nursing notes.  Pertinent labs & imaging results that were available during my care of the patient were reviewed by me and considered in my medical decision making (see chart for details).     Allergic rhinitis with sinus headache  No focal neuro deficits or red flags. Treating with Toradol and dexamethasone here in clinic.  Sending Flonase and Pataday eyedrops to the pharmacy. Follow up as needed for continued or worsening symptoms  Final Clinical Impressions(s) / UC Diagnoses   Final diagnoses:  Sinus headache  Allergic rhinitis, unspecified seasonality, unspecified trigger     Discharge Instructions     Treating your headache here today with Toradol and steroid injection I believe that your symptoms are from your sinuses and allergies Flonase daily and pataday eye drops for symptoms.  Follow up as needed for continued or worsening symptoms     ED Prescriptions    Medication Sig Dispense Auth. Provider   fluticasone (FLONASE) 50 MCG/ACT nasal spray Place 1 spray into both nostrils daily. 16 g Devondre Guzzetta A, NP   Olopatadine HCl 0.2 % SOLN Apply 1 drop to eye once for 1 dose. 2.5 mL Dahlia Byes A, NP     PDMP not reviewed this encounter.   Dahlia Byes A, NP 04/30/19 1315

## 2019-04-30 NOTE — ED Triage Notes (Signed)
Patient presents to Urgent Care with complaints of bilateral eye pain since the past few days. Patient reports her eyes burn chronically, but it has been worse recently.

## 2019-04-30 NOTE — Discharge Instructions (Addendum)
Treating your headache here today with Toradol and steroid injection I believe that your symptoms are from your sinuses and allergies Flonase daily and pataday eye drops for symptoms.  Follow up as needed for continued or worsening symptoms

## 2019-06-03 IMAGING — US US TRANSVAGINAL NON-OB
1 series · 15 of 25 positions shown · non-contrast
Comparison: 09/17/2016

CLINICAL DATA: Pain due to IUD

EXAM:
TRANSABDOMINAL AND TRANSVAGINAL ULTRASOUND OF PELVIS
TECHNIQUE: Both transabdominal and transvaginal ultrasound examinations of the
pelvis were performed. Transabdominal technique was performed for
global imaging of the pelvis including uterus, ovaries, adnexal
regions, and pelvic cul-de-sac. It was necessary to proceed with
endovaginal exam following the transabdominal exam to visualize the
uterus and endometrium.

[Series 1: us transvaginal non-ob · 58 acquisitions, 15 frames shown]
[im 1/58]
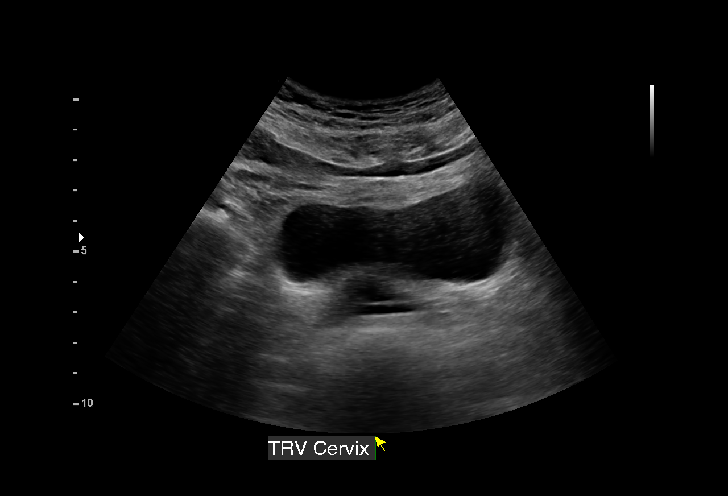
[im 5/58]
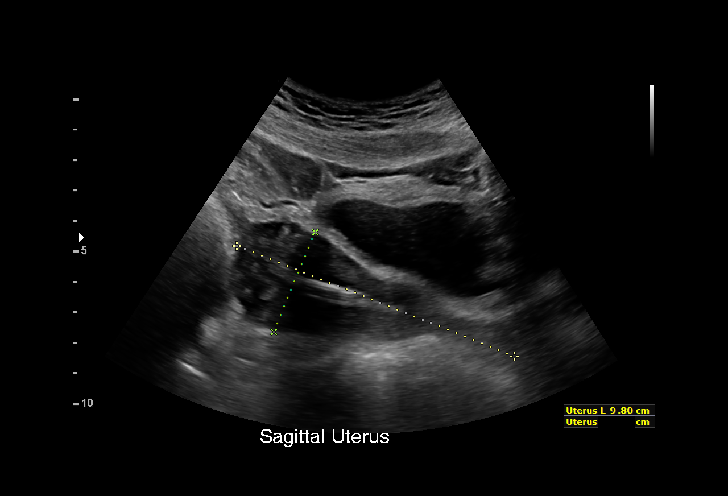
[im 10/58]
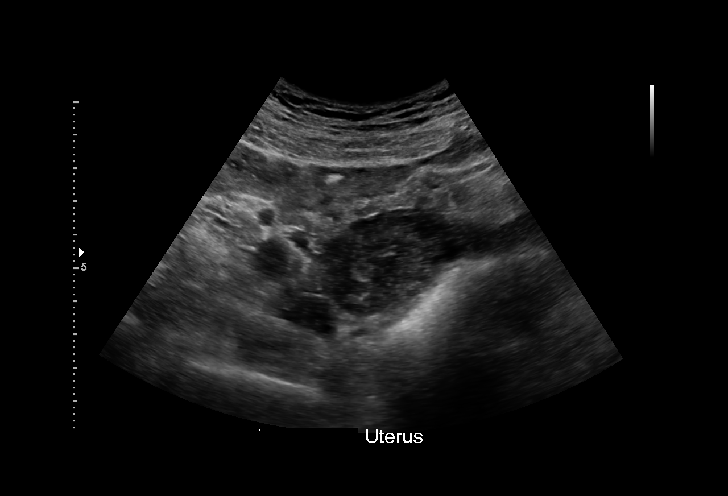
[im 12/58]
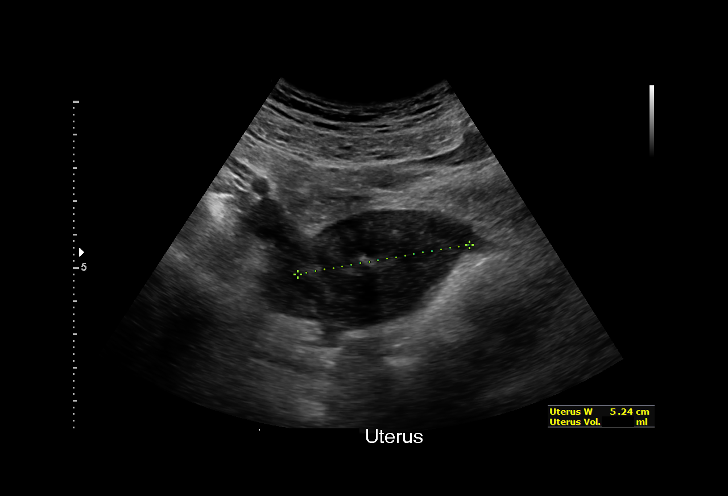
[im 17/58]
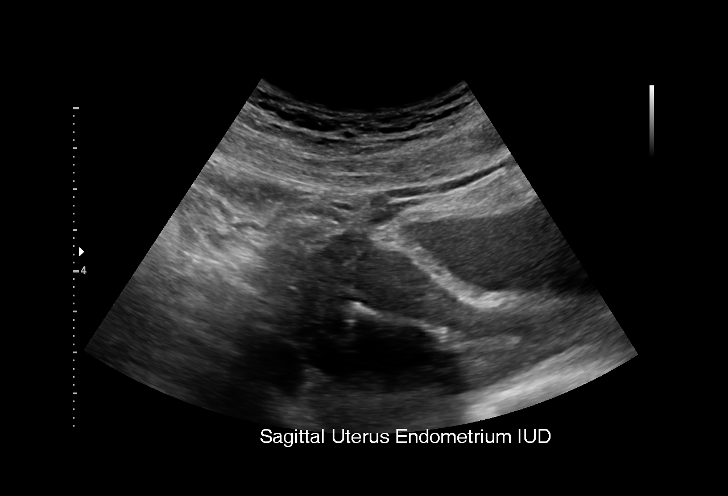
[im 22/58]
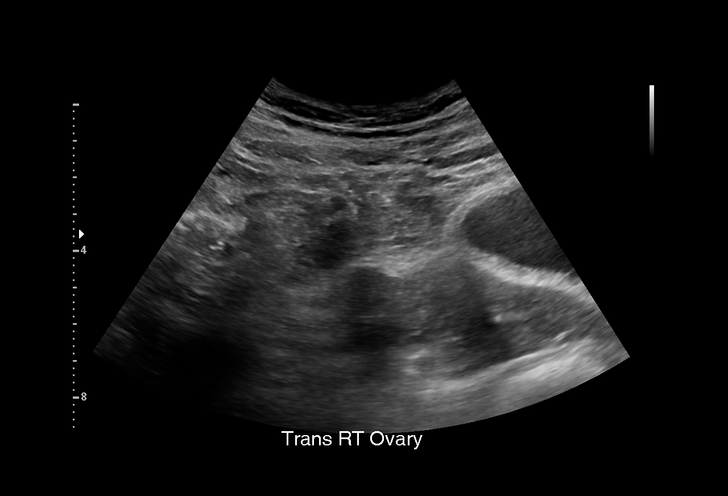
[im 24/58]
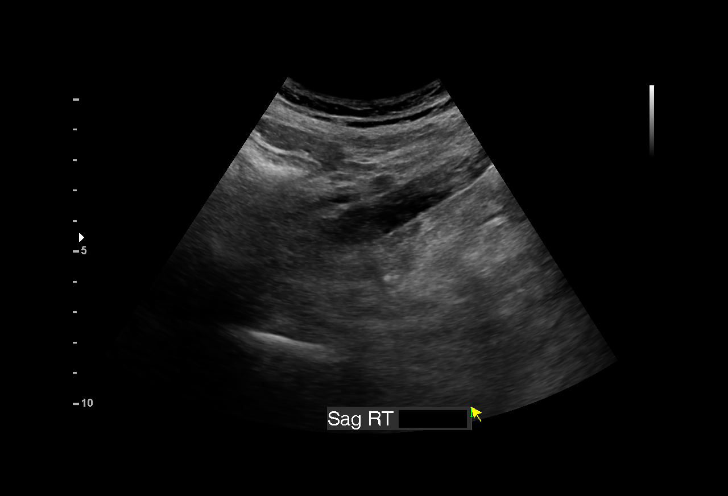
[im 29/58]
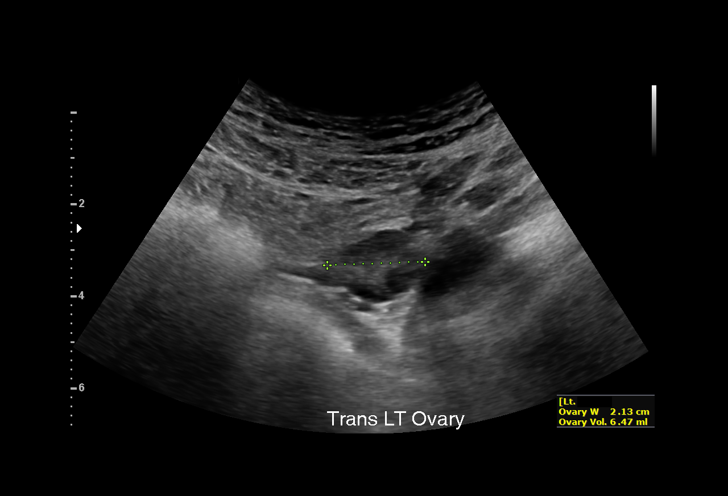
[im 34/58]
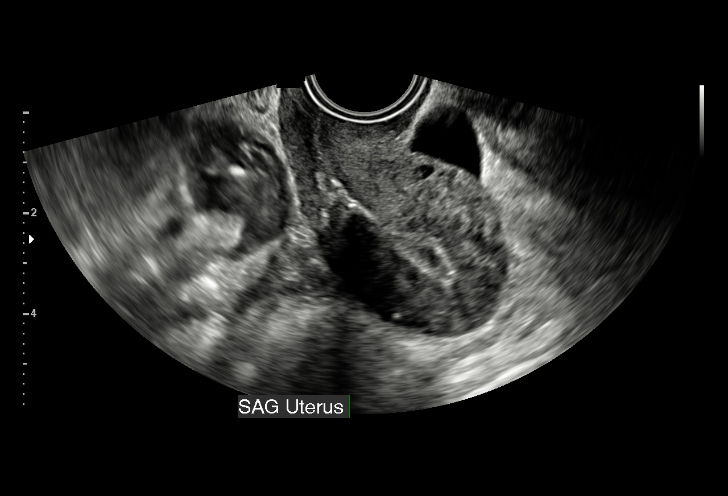
[im 36/58]
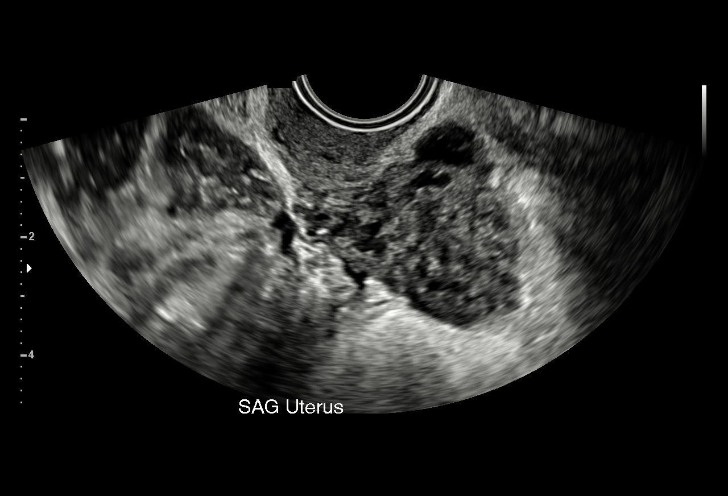
[im 41/58]
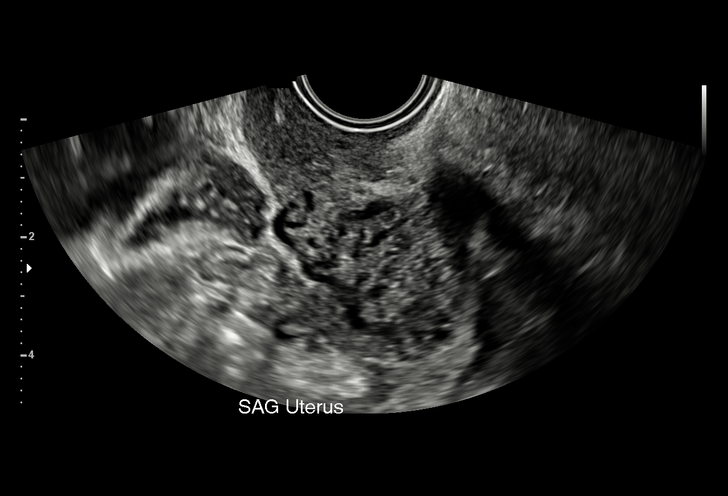
[im 46/58]
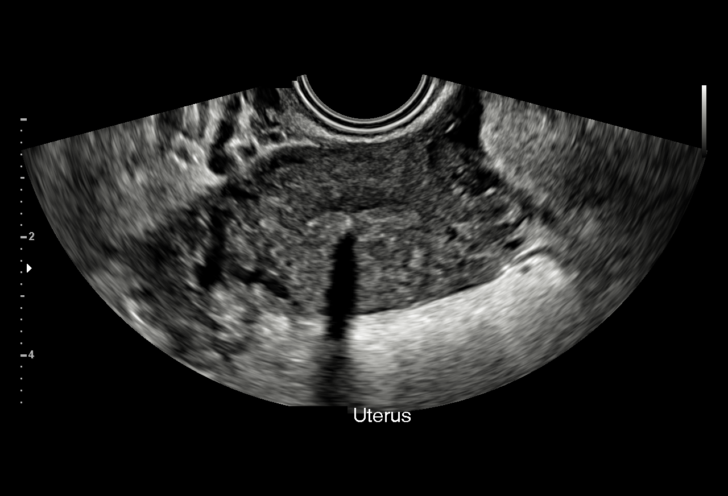
[im 48/58]
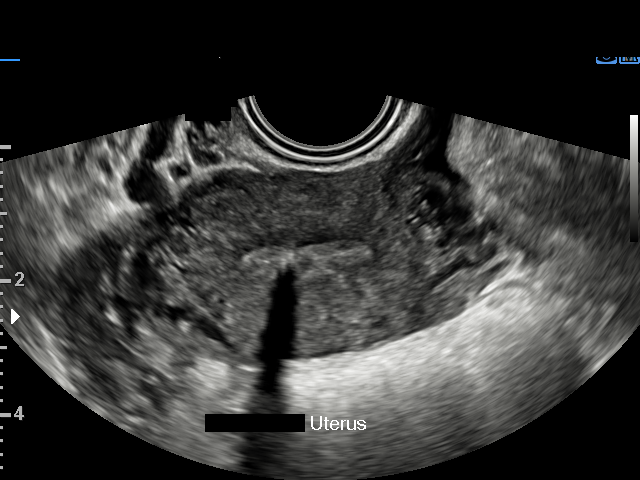
[im 53/58]
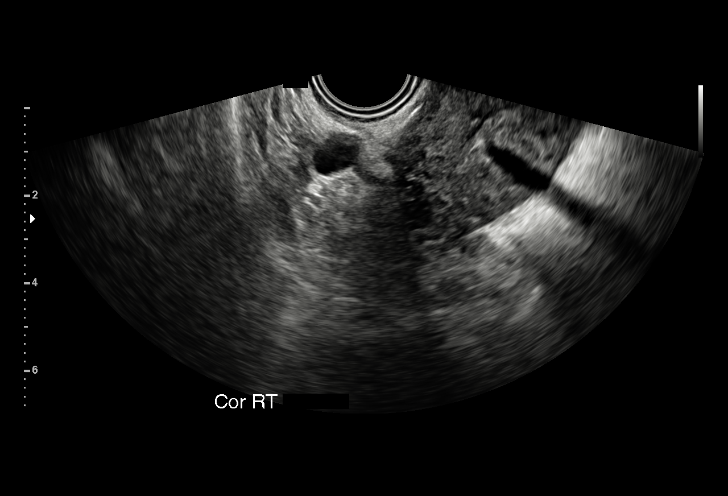
[im 58/58]
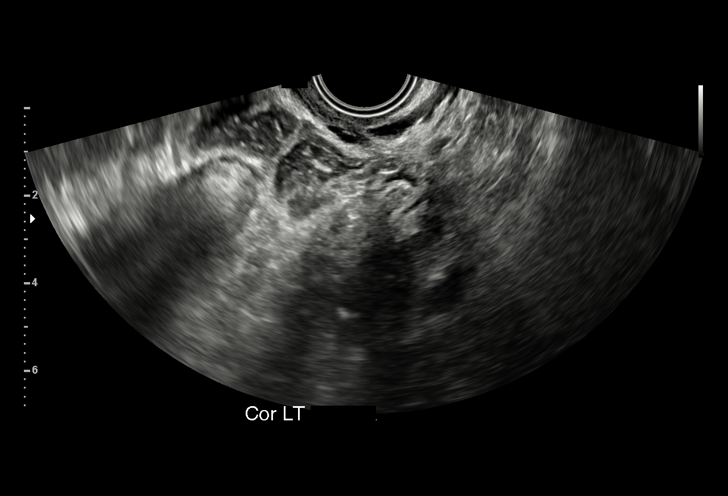

[15 of 25 positions shown; findings below may reference images not displayed]

FINDINGS: Uterus

Measurements: 9.8 x 3.5 x 5.2 cm, retroverted. No fibroids or other
mass visualized.

Endometrium

Thickness: 3 mm in thickness. IUD appears to be in expected
position.

Right ovary

Measurements: 3.8 x 2.5 x 2.0 cm. Normal appearance/no adnexal mass.

Left ovary

Measurements: 3.3 x 1.8 x 2.1 cm. Normal appearance/no adnexal mass.

Other findings

Trace free fluid in the pelvis.
IMPRESSION: IUD in expected position within the endometrial canal.

Trace free fluid, likely physiologic.

## 2019-06-12 ENCOUNTER — Encounter: Payer: Self-pay | Admitting: General Practice

## 2019-06-12 NOTE — Progress Notes (Unsigned)
Patient came into office reporting headaches with dizziness, abdominal pain like cramps & back pain each month around the time she would get her cycle. [place] [phone] used for today's encounter. Patient states this was happening with the IUD as well. Reports she had her IUD removed and Nexplanon placed in August to see if there would be an improvement but everything is still the same. Discussed need to come in for follow up appt with Dr Kennon Rounds. Patient verbalized understanding & states that is what she wants. Patient taken to front desk to schedule.   Koren Bound RN BSN 06/12/19

## 2019-07-12 ENCOUNTER — Ambulatory Visit (INDEPENDENT_AMBULATORY_CARE_PROVIDER_SITE_OTHER): Payer: Self-pay | Admitting: Family Medicine

## 2019-07-12 ENCOUNTER — Other Ambulatory Visit: Payer: Self-pay

## 2019-07-12 ENCOUNTER — Encounter: Payer: Self-pay | Admitting: Family Medicine

## 2019-07-12 DIAGNOSIS — R102 Pelvic and perineal pain: Secondary | ICD-10-CM

## 2019-07-12 NOTE — Progress Notes (Signed)
    Subjective:    Patient ID: Sandra Sexton is a 23 y.o. female presenting with Follow-up  on 07/12/2019  HPI: Reports that she is having bad pain in her stomach. I feel cold. I have a backache as well. Feels the same as when she had her IUD in place. Pain is more severe. There are times when she cannot stand. Pain is in lower abdomen and radiates to right side. Denies Nausea and vomiting. Pain is worse in the morning and gets better as the day goes on. Pain is not improved with food. Pain is improved when she leans down. Now reports pain is related to her Nexplanon and she previously had similar issues with her IUD. Feels like pressure and something is going to come out. She does not want to get pregnant now.  Review of Systems  Constitutional: Negative for chills and fever.  Respiratory: Negative for shortness of breath.   Cardiovascular: Negative for chest pain.  Gastrointestinal: Negative for abdominal pain, nausea and vomiting.  Genitourinary: Negative for dysuria.  Skin: Negative for rash.      Objective:    BP 138/76   Pulse 96   Wt 168 lb 6.4 oz (76.4 kg)   BMI 28.02 kg/m  Physical Exam Constitutional:      General: She is not in acute distress.    Appearance: She is well-developed.  HENT:     Head: Normocephalic and atraumatic.  Eyes:     General: No scleral icterus. Cardiovascular:     Rate and Rhythm: Normal rate.  Pulmonary:     Effort: Pulmonary effort is normal.  Abdominal:     Palpations: Abdomen is soft.  Musculoskeletal:     Cervical back: Neck supple.  Skin:    General: Skin is warm and dry.  Neurological:     Mental Status: She is alert and oriented to person, place, and time.         Assessment & Plan:   Problem List Items Addressed This Visit      Unprioritized   Pelvic pain    Unclear etiology..has normal u/s. She thinks it might be related to no cycle or a wrong fit size of her IUD, though her pain did not improve with IUD removal.  Wants her Nexplanon removed and another IUD placed-->but wants copper IUD so that she can have a cycle. Discussed possible need for further work up with GI if needed.         Total face-to-face time with patient: 25 minutes. Over 50% of encounter was spent on counseling and coordination of care. Return in about 4 weeks (around 08/09/2019) for in person for Nexplanon removal.  Donnamae Jude 07/13/2019 10:45 AM

## 2019-07-12 NOTE — Progress Notes (Signed)
Pt states she is having abdominal and back pain; has not taken any medication for the pain. Last BM today. Pt states she has felt unwell since having Nexplanon inserted August 2020. She reports feeling the same way prior to insertion while using the IUD.  Apolonio Schneiders RN 07/12/19

## 2019-07-13 ENCOUNTER — Encounter: Payer: Self-pay | Admitting: Family Medicine

## 2019-07-13 NOTE — Assessment & Plan Note (Signed)
Unclear etiology..has normal u/s. She thinks it might be related to no cycle or a wrong fit size of her IUD, though her pain did not improve with IUD removal. Wants her Nexplanon removed and another IUD placed-->but wants copper IUD so that she can have a cycle. Discussed possible need for further work up with GI if needed.

## 2019-08-09 ENCOUNTER — Ambulatory Visit: Payer: Self-pay | Admitting: Obstetrics and Gynecology

## 2019-10-24 ENCOUNTER — Other Ambulatory Visit: Payer: Self-pay

## 2019-10-24 ENCOUNTER — Encounter: Payer: Self-pay | Admitting: Family Medicine

## 2019-10-24 ENCOUNTER — Ambulatory Visit (INDEPENDENT_AMBULATORY_CARE_PROVIDER_SITE_OTHER): Payer: Self-pay | Admitting: Family Medicine

## 2019-10-24 VITALS — BP 104/61 | HR 62 | Wt 162.9 lb

## 2019-10-24 DIAGNOSIS — Z30011 Encounter for initial prescription of contraceptive pills: Secondary | ICD-10-CM

## 2019-10-24 DIAGNOSIS — Z3046 Encounter for surveillance of implantable subdermal contraceptive: Secondary | ICD-10-CM

## 2019-10-24 DIAGNOSIS — R102 Pelvic and perineal pain: Secondary | ICD-10-CM

## 2019-10-24 MED ORDER — NORGESTIMATE-ETH ESTRADIOL 0.25-35 MG-MCG PO TABS: 1.0000 | ORAL_TABLET | Freq: Every day | ORAL | 11 refills | Status: DC

## 2019-10-24 NOTE — Patient Instructions (Signed)

## 2019-10-24 NOTE — Progress Notes (Signed)
   Subjective:    Patient ID: Sandra Sexton is a 24 y.o. female presenting with Procedure  on 10/24/2019 Live Swahili interpreter: Janann August used  HPI: Here today for Nexplanon removal. Reports no cycle and having pain. This pain has been present with her IUD and for several years. Recent change from IUD to [facility] for same. Wants OCs now. Has normal u/s with IUD in place.  Review of Systems  Constitutional: Negative for chills and fever.  Respiratory: Negative for shortness of breath.   Cardiovascular: Negative for chest pain.  Gastrointestinal: Negative for abdominal pain, nausea and vomiting.  Genitourinary: Positive for pelvic pain. Negative for dysuria.  Skin: Negative for rash.      Objective:    BP 104/61   Pulse 62   Wt 162 lb 14.4 oz (73.9 kg)   Breastfeeding No   BMI 27.11 kg/m  Physical Exam Constitutional:      General: She is not in acute distress.    Appearance: She is well-developed.  HENT:     Head: Normocephalic and atraumatic.  Eyes:     General: No scleral icterus. Cardiovascular:     Rate and Rhythm: Normal rate.  Pulmonary:     Effort: Pulmonary effort is normal.  Abdominal:     Palpations: Abdomen is soft.  Musculoskeletal:     Cervical back: Neck supple.  Skin:    General: Skin is warm and dry.  Neurological:     Mental Status: She is alert and oriented to person, place, and time.    Procedure: Patient given informed consent for removal of her Nexplanon, time out was performed.  Signed copy in the chart.  Appropriate time out taken. Nexplanon site identified.  Area prepped in usual sterile fashon. One cc of 1% lidocaine was used to anesthetize the area at the distal end of the implant. A small stab incision was made right beside the implant on the distal portion.  The Nexplanon rod was grasped using hemostats and removed without difficulty.  There was less than 3 cc blood loss. There were no complications.  A small amount of antibiotic  ointment and steri-strips were applied over the small incision.  A pressure bandage was applied to reduce any bruising.  The patient tolerated the procedure well and was given post procedure instructions.      Assessment & Plan:   Problem List Items Addressed This Visit      Unprioritized   Pelvic pain    Unclear etiology--now s/p Nexplanon removal--begin OCs.       Other Visit Diagnoses    Encounter for initial prescription of contraceptive pills    -  Primary   Relevant Medications   norgestimate-ethinyl estradiol (ORTHO-CYCLEN) 0.25-35 MG-MCG tablet   Nexplanon removal          Total time: 10 minutes.  Return in about 1 year (around 10/23/2020), or if symptoms worsen or fail to improve.  Reva Bores 10/24/2019 9:23 AM

## 2019-10-24 NOTE — Assessment & Plan Note (Signed)
Unclear etiology--now s/p Nexplanon removal--begin OCs.

## 2019-11-19 ENCOUNTER — Other Ambulatory Visit: Payer: Self-pay

## 2019-11-19 ENCOUNTER — Ambulatory Visit (HOSPITAL_COMMUNITY)
Admission: EM | Admit: 2019-11-19 | Discharge: 2019-11-19 | Disposition: A | Discharge status: Home / Self Care | Payer: BC Managed Care – PPO | Attending: Internal Medicine | Admitting: Internal Medicine

## 2019-11-19 ENCOUNTER — Encounter (HOSPITAL_COMMUNITY): Payer: Self-pay

## 2019-11-19 DIAGNOSIS — J309 Allergic rhinitis, unspecified: Secondary | ICD-10-CM

## 2019-11-19 DIAGNOSIS — H1013 Acute atopic conjunctivitis, bilateral: Secondary | ICD-10-CM

## 2019-11-19 MED ORDER — FLUTICASONE PROPIONATE 50 MCG/ACT NA SUSP: 1.0000 | Freq: Every day | NASAL | 2 refills | Status: DC

## 2019-11-19 MED ORDER — OLOPATADINE HCL 0.1 % OP SOLN: 1.0000 [drp] | Freq: Two times a day (BID) | OPHTHALMIC | 12 refills | Status: DC

## 2019-11-19 MED ORDER — LORATADINE 10 MG PO TABS: 10.0000 mg | ORAL_TABLET | Freq: Every day | ORAL | Status: DC

## 2019-11-19 NOTE — ED Triage Notes (Signed)
Pt states she has eye pain and eye drainage x 2 weeks.

## 2019-11-19 NOTE — ED Provider Notes (Signed)
[facility]    CSN: [id number] Arrival date & time: 11/19/19  0944      History   Chief Complaint Chief Complaint  Patient presents with  . Eye Pain    HPI Sandra Sexton is a 24 y.o. female comes to urgent care with complaints of itchy, watery eyes and itchy nose of 2 weeks duration.  Patient symptoms have been intermittent over the past 3 years.  Usually happens at this time of the year.  She has some nasal congestion but no runny nose.  She has nasal itching but denies any throat itching.  No fever or chills.  No cough or sputum production.  No wheezing.Marland Kitchen   HPI  Past Medical History:  Diagnosis Date  . H/O malaria 2015   [place]    Patient Active Problem List   Diagnosis Date Noted  . Pelvic pain 02/12/2019  . Language barrier affecting health care 05/20/2015    Past Surgical History:  Procedure Laterality Date  . NO PAST SURGERIES      OB History    Gravida  3   Para  3   Term  3   Preterm      AB      Living  3     SAB      TAB      Ectopic      Multiple  0   Live Births  3            Home Medications    Prior to Admission medications   Medication Sig Start Date End Date Taking? Authorizing Provider  fluticasone (FLONASE) 50 MCG/ACT nasal spray Place 1 spray into both nostrils daily. 11/19/19   Debarah Mccumbers, Britta Mccreedy, MD  ibuprofen (ADVIL) 200 MG tablet Take 400 mg by mouth every 6 (six) hours as needed.    [provider]  loratadine (CLARITIN) 10 MG tablet Take 1 tablet (10 mg total) by mouth daily. 11/19/19   Merrilee Jansky, MD  norgestimate-ethinyl estradiol (ORTHO-CYCLEN) 0.25-35 MG-MCG tablet Take 1 tablet by mouth daily. 10/24/19   Reva Bores, MD  olopatadine (PATANOL) 0.1 % ophthalmic solution Place 1 drop into both eyes 2 (two) times daily. 11/19/19   Jovan Colligan, Britta Mccreedy, MD    Family History History reviewed. No pertinent family history.  Social History Social History   Tobacco  Use  . Smoking status: Never Smoker  . Smokeless tobacco: Never Used  Substance Use Topics  . Alcohol use: No  . Drug use: No     Allergies   Patient has no known allergies.   Review of Systems Review of Systems  Constitutional: Negative for activity change, chills, fatigue and fever.  HENT: Negative for congestion, postnasal drip, rhinorrhea, sore throat and tinnitus.   Eyes: Positive for redness and itching. Negative for photophobia, pain and visual disturbance.  Respiratory: Negative.   Gastrointestinal: Negative.   Genitourinary: Negative.   Neurological: Positive for facial asymmetry and headaches.  Psychiatric/Behavioral: Negative.      Physical Exam Triage Vital Signs ED Triage Vitals  Enc Vitals Group     BP 11/19/19 1051 118/73     Pulse Rate 11/19/19 1051 67     Resp 11/19/19 1051 16     Temp 11/19/19 1051 98.2 F (36.8 C)     Temp Source 11/19/19 1051 Oral     SpO2 11/19/19 1051 98 %     Weight 11/19/19 1049 164 lb (74.4 kg)  Height --      Head Circumference --      Peak Flow --      Pain Score 11/19/19 1049 5     Pain Loc --      Pain Edu? --      Excl. in [place]? --    No data found.  Updated Vital Signs BP 118/73 (BP Location: Right Arm)   Pulse 67   Temp 98.2 F (36.8 C) (Oral)   Resp 16   Wt 74.4 kg   SpO2 98%   BMI 27.29 kg/m   Visual Acuity Right Eye Distance:   Left Eye Distance:   Bilateral Distance:    Right Eye Near: R Near: 20/25 Left Eye Near:  L Near: 20/25 Bilateral Near:  20/25  Physical Exam   UC Treatments / Results  Labs (all labs ordered are listed, but only abnormal results are displayed) Labs Reviewed - No data to display  EKG   Radiology No results found.  Procedures Procedures (including critical care time)  Medications Ordered in UC Medications - No data to display  Initial Impression / Assessment and Plan / UC Course  I have reviewed the triage vital signs and the nursing notes.  Pertinent  labs & imaging results that were available during my care of the patient were reviewed by me and considered in my medical decision making (see chart for details).     1.  Allergic conjunctivitis and rhinitis, bilateral: Fluticasone nasal spray Patanol eyes. Claritin over-the-counter daily Return precautions given Final Clinical Impressions(s) / UC Diagnoses   Final diagnoses:  Allergic conjunctivitis and rhinitis, bilateral   Discharge Instructions   None    ED Prescriptions    Medication Sig Dispense Auth. Provider   fluticasone (FLONASE) 50 MCG/ACT nasal spray Place 1 spray into both nostrils daily. 16 g Decarla Siemen, Myrene Galas, MD   olopatadine (PATANOL) 0.1 % ophthalmic solution Place 1 drop into both eyes 2 (two) times daily. 5 mL Kyrillos Adams, Myrene Galas, MD   loratadine (CLARITIN) 10 MG tablet Take 1 tablet (10 mg total) by mouth daily.  Ivie Savitt, Myrene Galas, MD     PDMP not reviewed this encounter.   Chase Picket, MD 11/19/19 [phone]

## 2019-11-26 ENCOUNTER — Encounter: Payer: Self-pay | Admitting: *Deleted

## 2020-01-16 ENCOUNTER — Encounter: Payer: Self-pay | Admitting: Women's Health

## 2020-01-16 ENCOUNTER — Inpatient Hospital Stay (HOSPITAL_COMMUNITY): Payer: Medicaid Other

## 2020-01-16 ENCOUNTER — Other Ambulatory Visit (HOSPITAL_COMMUNITY)
Admission: RE | Admit: 2020-01-16 | Discharge: 2020-01-16 | Disposition: A | Discharge status: Home / Self Care | Payer: Medicaid Other | Source: Ambulatory Visit | Attending: Women's Health | Admitting: Women's Health

## 2020-01-16 ENCOUNTER — Inpatient Hospital Stay (HOSPITAL_COMMUNITY)
Admission: AD | Admit: 2020-01-16 | Discharge: 2020-01-16 | Disposition: A | Discharge status: Home / Self Care | Payer: Medicaid Other | Attending: Obstetrics & Gynecology | Admitting: Obstetrics & Gynecology

## 2020-01-16 ENCOUNTER — Other Ambulatory Visit: Payer: Self-pay

## 2020-01-16 ENCOUNTER — Encounter (HOSPITAL_COMMUNITY): Payer: Self-pay | Admitting: Obstetrics & Gynecology

## 2020-01-16 ENCOUNTER — Ambulatory Visit (INDEPENDENT_AMBULATORY_CARE_PROVIDER_SITE_OTHER): Payer: Self-pay | Admitting: Women's Health

## 2020-01-16 VITALS — BP 106/71 | HR 87 | Wt 162.3 lb

## 2020-01-16 DIAGNOSIS — Z3A08 8 weeks gestation of pregnancy: Secondary | ICD-10-CM | POA: Diagnosis not present

## 2020-01-16 DIAGNOSIS — R102 Pelvic and perineal pain: Secondary | ICD-10-CM | POA: Diagnosis not present

## 2020-01-16 DIAGNOSIS — R109 Unspecified abdominal pain: Secondary | ICD-10-CM | POA: Diagnosis not present

## 2020-01-16 DIAGNOSIS — R87610 Atypical squamous cells of undetermined significance on cytologic smear of cervix (ASC-US): Secondary | ICD-10-CM | POA: Diagnosis not present

## 2020-01-16 DIAGNOSIS — O219 Vomiting of pregnancy, unspecified: Secondary | ICD-10-CM

## 2020-01-16 DIAGNOSIS — R110 Nausea: Secondary | ICD-10-CM | POA: Insufficient documentation

## 2020-01-16 DIAGNOSIS — O26891 Other specified pregnancy related conditions, first trimester: Secondary | ICD-10-CM | POA: Insufficient documentation

## 2020-01-16 DIAGNOSIS — Z3201 Encounter for pregnancy test, result positive: Secondary | ICD-10-CM

## 2020-01-16 DIAGNOSIS — Z3490 Encounter for supervision of normal pregnancy, unspecified, unspecified trimester: Secondary | ICD-10-CM

## 2020-01-16 LAB — CBC
HCT: 35.7 % — ABNORMAL LOW (ref 36.0–46.0)
Hemoglobin: 11.7 g/dL — ABNORMAL LOW (ref 12.0–15.0)
MCH: 30.6 pg (ref 26.0–34.0)
MCHC: 32.8 g/dL (ref 30.0–36.0)
MCV: 93.5 fL (ref 80.0–100.0)
Platelets: 185 10*3/uL (ref 150–400)
RBC: 3.82 MIL/uL — ABNORMAL LOW (ref 3.87–5.11)
RDW: 11.9 % (ref 11.5–15.5)
WBC: 4.9 10*3/uL (ref 4.0–10.5)
nRBC: 0 % (ref 0.0–0.2)

## 2020-01-16 LAB — POCT PREGNANCY, URINE: Preg Test, Ur: POSITIVE — AB

## 2020-01-16 LAB — HCG, QUANTITATIVE, PREGNANCY: hCG, Beta Chain, Quant, S: 202311 m[IU]/mL — ABNORMAL HIGH (ref <5)

## 2020-01-16 LAB — HIV ANTIBODY (ROUTINE TESTING W REFLEX): HIV Screen 4th Generation wRfx: NONREACTIVE

## 2020-01-16 LAB — POCT URINALYSIS DIP (DEVICE)
Bilirubin Urine: NEGATIVE
Glucose, UA: NEGATIVE mg/dL
Hgb urine dipstick: NEGATIVE
Ketones, ur: NEGATIVE mg/dL
Leukocytes,Ua: NEGATIVE
Nitrite: NEGATIVE
Protein, ur: 100 mg/dL — AB
Specific Gravity, Urine: 1.015 (ref 1.005–1.030)
Urobilinogen, UA: 0.2 mg/dL (ref 0.0–1.0)
pH: 8.5 — ABNORMAL HIGH (ref 5.0–8.0)

## 2020-01-16 LAB — WET PREP, GENITAL
Clue Cells Wet Prep HPF POC: NONE SEEN
Sperm: NONE SEEN
Trich, Wet Prep: NONE SEEN
Yeast Wet Prep HPF POC: NONE SEEN

## 2020-01-16 MED ORDER — DOXYLAMINE-PYRIDOXINE 10-10 MG PO TBEC
2.0000 | DELAYED_RELEASE_TABLET | Freq: Every day | ORAL | 1 refills | Status: DC
Start: 2020-01-16 — End: 2020-03-25

## 2020-01-16 NOTE — Progress Notes (Signed)
Written and verbal d/c instructions given using [employer] Kisw[place]ili. Pt states she is having nausea and unable to keep down much. Will ask provider to send script for nausea med to pt's pharmacy and pt likes that idea. Pharmacy verified with pt. Pt signed for d/c and d/c home.

## 2020-01-16 NOTE — MAU Note (Signed)
Presents with c/o abdominal pain, reports pain is all over.  Denies VB.  LMP 07/26/19.  Sent from office for ectopic workup.

## 2020-01-16 NOTE — Discharge Instructions (Signed)
First Trimester of Pregnancy The first trimester of pregnancy is from week 1 until the end of week 13 (months 1 through 3). A week after a sperm fertilizes an egg, the egg will implant on the wall of the uterus. This embryo will begin to develop into a baby. Genes from you and your partner will form the baby. The female genes will determine whether the baby will be a boy or a girl. At 6-8 weeks, the eyes and face will be formed, and the heartbeat can be seen on ultrasound. At the end of 12 weeks, all the baby's organs will be formed. Now that you are pregnant, you will want to do everything you can to have a healthy baby. Two of the most important things are to get good prenatal care and to follow your health care provider's instructions. Prenatal care is all the medical care you receive before the baby's birth. This care will help prevent, find, and treat any problems during the pregnancy and childbirth. Body changes during your first trimester Your body goes through many changes during pregnancy. The changes vary from woman to woman.  You may gain or lose a couple of pounds at first.  You may feel sick to your stomach (nauseous) and you may throw up (vomit). If the vomiting is uncontrollable, call your health care provider.  You may tire easily.  You may develop headaches that can be relieved by medicines. All medicines should be approved by your health care provider.  You may urinate more often. Painful urination may mean you have a bladder infection.  You may develop heartburn as a result of your pregnancy.  You may develop constipation because certain hormones are causing the muscles that push stool through your intestines to slow down.  You may develop hemorrhoids or swollen veins (varicose veins).  Your breasts may begin to grow larger and become tender. Your nipples may stick out more, and the tissue that surrounds them (areola) may become darker.  Your gums may bleed and may be  sensitive to brushing and flossing.  Dark spots or blotches (chloasma, mask of pregnancy) may develop on your face. This will likely fade after the baby is born.  Your menstrual periods will stop.  You may have a loss of appetite.  You may develop cravings for certain kinds of food.  You may have changes in your emotions from day to day, such as being excited to be pregnant or being concerned that something may go wrong with the pregnancy and baby.  You may have more vivid and strange dreams.  You may have changes in your hair. These can include thickening of your hair, rapid growth, and changes in texture. Some women also have hair loss during or after pregnancy, or hair that feels dry or thin. Your hair will most likely return to normal after your baby is born. What to expect at prenatal visits During a routine prenatal visit:  You will be weighed to make sure you and the baby are growing normally.  Your blood pressure will be taken.  Your abdomen will be measured to track your baby's growth.  The fetal heartbeat will be listened to between weeks 10 and 14 of your pregnancy.  Test results from any previous visits will be discussed. Your health care provider may ask you:  How you are feeling.  If you are feeling the baby move.  If you have had any abnormal symptoms, such as leaking fluid, bleeding, severe headaches, or abdominal   cramping.  If you are using any tobacco products, including cigarettes, chewing tobacco, and electronic cigarettes.  If you have any questions. Other tests that may be performed during your first trimester include:  Blood tests to find your blood type and to check for the presence of any previous infections. The tests will also be used to check for low iron levels (anemia) and protein on red blood cells (Rh antibodies). Depending on your risk factors, or if you previously had diabetes during pregnancy, you may have tests to check for high blood sugar  that affects pregnant women (gestational diabetes).  Urine tests to check for infections, diabetes, or protein in the urine.  An ultrasound to confirm the proper growth and development of the baby.  Fetal screens for spinal cord problems (spina bifida) and Down syndrome.  HIV (human immunodeficiency virus) testing. Routine prenatal testing includes screening for HIV, unless you choose not to have this test.  You may need other tests to make sure you and the baby are doing well. Follow these instructions at home: Medicines  Follow your health care provider's instructions regarding medicine use. Specific medicines may be either safe or unsafe to take during pregnancy.  Take a prenatal vitamin that contains at least 600 micrograms (mcg) of folic acid.  If you develop constipation, try taking a stool softener if your health care provider approves. Eating and drinking   Eat a balanced diet that includes fresh fruits and vegetables, whole grains, good sources of protein such as meat, eggs, or tofu, and low-fat dairy. Your health care provider will help you determine the amount of weight gain that is right for you.  Avoid raw meat and uncooked cheese. These carry germs that can cause birth defects in the baby.  Eating four or five small meals rather than three large meals a day may help relieve nausea and vomiting. If you start to feel nauseous, eating a few soda crackers can be helpful. Drinking liquids between meals, instead of during meals, also seems to help ease nausea and vomiting.  Limit foods that are high in fat and processed sugars, such as fried and sweet foods.  To prevent constipation: ? Eat foods that are high in fiber, such as fresh fruits and vegetables, whole grains, and beans. ? Drink enough fluid to keep your urine clear or pale yellow. Activity  Exercise only as directed by your health care provider. Most women can continue their usual exercise routine during  pregnancy. Try to exercise for 30 minutes at least 5 days a week. Exercising will help you: ? Control your weight. ? Stay in shape. ? Be prepared for labor and delivery.  Experiencing pain or cramping in the lower abdomen or lower back is a good sign that you should stop exercising. Check with your health care provider before continuing with normal exercises.  Try to avoid standing for long periods of time. Move your legs often if you must stand in one place for a long time.  Avoid heavy lifting.  Wear low-heeled shoes and practice good posture.  You may continue to have sex unless your health care provider tells you not to. Relieving pain and discomfort  Wear a good support bra to relieve breast tenderness.  Take warm sitz baths to soothe any pain or discomfort caused by hemorrhoids. Use hemorrhoid cream if your health care provider approves.  Rest with your legs elevated if you have leg cramps or low back pain.  If you develop varicose veins in   your legs, wear support hose. Elevate your feet for 15 minutes, 3-4 times a day. Limit salt in your diet. Prenatal care  Schedule your prenatal visits by the twelfth week of pregnancy. They are usually scheduled monthly at first, then more often in the last 2 months before delivery.  Write down your questions. Take them to your prenatal visits.  Keep all your prenatal visits as told by your health care provider. This is important. Safety  Wear your seat belt at all times when driving.  Make a list of emergency phone numbers, including numbers for family, friends, the hospital, and police and fire departments. General instructions  Ask your health care provider for a referral to a local prenatal education class. Begin classes no later than the beginning of month 6 of your pregnancy.  Ask for help if you have counseling or nutritional needs during pregnancy. Your health care provider can offer advice or refer you to specialists for help  with various needs.  Do not use hot tubs, steam rooms, or saunas.  Do not douche or use tampons or scented sanitary pads.  Do not cross your legs for long periods of time.  Avoid cat litter boxes and soil used by cats. These carry germs that can cause birth defects in the baby and possibly loss of the fetus by miscarriage or stillbirth.  Avoid all smoking, herbs, alcohol, and medicines not prescribed by your health care provider. Chemicals in these products affect the formation and growth of the baby.  Do not use any products that contain nicotine or tobacco, such as cigarettes and e-cigarettes. If you need help quitting, ask your health care provider. You may receive counseling support and other resources to help you quit.  Schedule a dentist appointment. At home, brush your teeth with a soft toothbrush and be gentle when you floss. Contact a health care provider if:  You have dizziness.  You have mild pelvic cramps, pelvic pressure, or nagging pain in the abdominal area.  You have persistent nausea, vomiting, or diarrhea.  You have a bad smelling vaginal discharge.  You have pain when you urinate.  You notice increased swelling in your face, hands, legs, or ankles.  You are exposed to fifth disease or chickenpox.  You are exposed to German measles (rubella) and have never had it. Get help right away if:  You have a fever.  You are leaking fluid from your vagina.  You have spotting or bleeding from your vagina.  You have severe abdominal cramping or pain.  You have rapid weight gain or loss.  You vomit blood or material that looks like coffee grounds.  You develop a severe headache.  You have shortness of breath.  You have any kind of trauma, such as from a fall or a car accident. Summary  The first trimester of pregnancy is from week 1 until the end of week 13 (months 1 through 3).  Your body goes through many changes during pregnancy. The changes vary from  woman to woman.  You will have routine prenatal visits. During those visits, your health care provider will examine you, discuss any test results you may have, and talk with you about how you are feeling. This information is not intended to replace advice given to you by your health care provider. Make sure you discuss any questions you have with your health care provider. Document Revised: 07/01/2017 Document Reviewed: 06/30/2016 Elsevier Patient Education  2020 [employer]  
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
## 2020-01-16 NOTE — Progress Notes (Signed)
GC/Chlamydia cultures & wet prep collected by L. Leftwich-Kirby, CNM.

## 2020-01-16 NOTE — Patient Instructions (Addendum)
Maternity Assessment Unit (MAU)  The Maternity Assessment Unit (MAU) is located at the [facility]. The address is: [address], [address], [place], [place] [address]. Please see map below for additional directions.    The Maternity Assessment Unit is designed to help you during your pregnancy, and for up to 6 weeks after delivery, with any pregnancy- or postpartum-related emergencies, if you think you are in labor, or if your water has broken. For example, if you experience nausea and vomiting, vaginal bleeding, severe abdominal or pelvic pain, elevated blood pressure or other problems related to your pregnancy or postpartum time, please come to the Maternity Assessment Unit for assistance.        

## 2020-01-16 NOTE — MAU Provider Note (Signed)
Elaria Osias [id number] 08/18/1995    -Nurse reports results returned and IUP noted. Initial provider unavailable. Chart reviewed by provider.   Vitals:   01/16/20 1810 01/16/20 2038  BP: 111/62 (!) 115/56  Pulse: 71 70  Resp: 20 16  Temp: 99 F (37.2 C)   SpO2: 100%     Results for orders placed or performed during the hospital encounter of 01/16/20 (from the past 24 hour(s))  Wet prep, genital     Status: Abnormal   Collection Time: 01/16/20  6:52 PM   Specimen: PATH Cytology Cervicovaginal Ancillary Only  Result Value Ref Range   Yeast Wet Prep HPF POC NONE SEEN NONE SEEN   Trich, Wet Prep NONE SEEN NONE SEEN   Clue Cells Wet Prep HPF POC NONE SEEN NONE SEEN   WBC, Wet Prep HPF POC MANY (A) NONE SEEN   Sperm NONE SEEN   CBC     Status: Abnormal   Collection Time: 01/16/20  7:07 PM  Result Value Ref Range   WBC 4.9 4.0 - 10.5 K/uL   RBC 3.82 (L) 3.87 - 5.11 MIL/uL   Hemoglobin 11.7 (L) 12.0 - 15.0 g/dL   HCT 89.1 (L) 36 - 46 %   MCV 93.5 80.0 - 100.0 fL   MCH 30.6 26.0 - 34.0 pg   MCHC 32.8 30.0 - 36.0 g/dL   RDW 69.4 50.3 - 88.8 %   Platelets 185 150 - 400 K/uL   nRBC 0.0 0.0 - 0.2 %  hCG, quantitative, pregnancy     Status: Abnormal   Collection Time: 01/16/20  7:07 PM  Result Value Ref Range   hCG, Beta Chain, Quant, S 202,311 (H) <5 mIU/mL  HIV Antibody (routine testing w rflx)     Status: None   Collection Time: 01/16/20  7:07 PM  Result Value Ref Range   HIV Screen 4th Generation wRfx Non Reactive Non Reactive   US OB Comp Less 14 Wks  Result Date: 01/16/2020 CLINICAL DATA:  Abdominal pain. First trimester pregnancy. LMP 07/27/2019. EXAM: OBSTETRIC <14 WK ULTRASOUND TECHNIQUE: Transabdominal ultrasound was performed for evaluation of the gestation as well as the maternal uterus and adnexal regions. COMPARISON:  None. FINDINGS: Intrauterine gestational sac: Visualized/normal in shape. Yolk sac:  Visualized. Embryo:  Visualized. Cardiac Activity:  Visualized. Heart Rate: 164 bpm CRL:  20.8 mm; 8 w 4 d;                  [place] EDC: 08/23/2020 Subchorionic hemorrhage: None. Maternal uterus/adnexae: Both maternal ovaries are visualized. No adnexal mass or significant free pelvic fluid. IMPRESSION: 1. Single live intrauterine gestation with best estimated gestational age of [redacted] weeks 4 days. 2. No placental or adnexal abnormality identified. Electronically Signed   By: Carey Bullocks M.D.   On: 01/16/2020 19:39    SIUP at 8.4 weeks Nausea  -Chart and Results Reviewed. -Nurse informed okay to discharge. -Patient reports some nausea. -Rx for Diclegis sent to pharmacy on file.  -Encouraged to call or return to MAU if symptoms worsen or with the onset of new symptoms. -Discharged to home in stable condition.  Cherre Robins MSN, CNM Advanced Practice Provider, [facility]

## 2020-01-17 LAB — CERVICOVAGINAL ANCILLARY ONLY
Bacterial Vaginitis (gardnerella): NEGATIVE
Candida Glabrata: NEGATIVE
Candida Vaginitis: NEGATIVE
Chlamydia: NEGATIVE
Comment: NEGATIVE
Comment: NEGATIVE
Comment: NEGATIVE
Comment: NEGATIVE
Comment: NEGATIVE
Comment: NORMAL
Neisseria Gonorrhea: NEGATIVE
Trichomonas: NEGATIVE

## 2020-01-17 LAB — URINE CULTURE: Organism ID, Bacteria: NO GROWTH

## 2020-01-17 LAB — GC/CHLAMYDIA PROBE AMP (~~LOC~~) NOT AT ARMC
Chlamydia: NEGATIVE
Comment: NEGATIVE
Comment: NORMAL
Neisseria Gonorrhea: NEGATIVE

## 2020-01-20 NOTE — MAU Provider Note (Signed)
Chief Complaint: Abdominal Pain   First Provider Initiated Contact with Patient 01/16/20 1831      SUBJECTIVE HPI: Sandra Sexton is a 24 y.o. G4P3003 at [redacted]w[redacted]d by LMP who presents to maternity admissions sent from the office for positive pregnancy test and abdominal pain.  She reports pain since 2018 when she had IUD placed that has worsened in last few days.  She had IUD from 07/21/2017 to 03/12/2019.  She had IUD removed because of pelvic pain. [place] prior to removal showed IUD in normal position, no clear etiology seen for pain.  She had Nexplanon placed at time of IUD removal on 03/12/2019 but had Nexplanon removed and was prescribed OCPs on 07/12/2019.  Pelvic pain has persisted despite removal of IUD. This pregnancy was not planned and she was surprised by the positive test in the office today but she reports she is happy about the pregnancy. She denies vaginal bleeding, vaginal itching/burning, urinary symptoms, h/a, dizziness, vomiting, or fever/chills.     Location: lower abdomen Quality: cramping Severity: 7/10 on pain scale Duration: 3 years Timing: intermittent Modifying factors: ibuprofen/Tylenol  Associated signs and symptoms: none  HPI  Past Medical History:  Diagnosis Date  . H/O malaria 2015   [place]   Past Surgical History:  Procedure Laterality Date  . NO PAST SURGERIES     Social History   Socioeconomic History  . Marital status: Married    Spouse name: Not on file  . Number of children: Not on file  . Years of education: Not on file  . Highest education level: Not on file  Occupational History  . Not on file  Tobacco Use  . Smoking status: Never Smoker  . Smokeless tobacco: Never Used  Vaping Use  . Vaping Use: Never used  Substance and Sexual Activity  . Alcohol use: No  . Drug use: No  . Sexual activity: Yes    Birth control/protection: Implant    Comment: Nexplanon  Other Topics Concern  . Not on file  Social History  Narrative  . Not on file   Social Determinants of Health   Financial Resource Strain:   . Difficulty of Paying Living Expenses:   Food Insecurity:   . Worried About Programme researcher, broadcasting/film/video in the Last Year:   . Barista in the Last Year:   Transportation Needs:   . Freight forwarder (Medical):   Marland Kitchen Lack of Transportation (Non-Medical):   Physical Activity:   . Days of Exercise per Week:   . Minutes of Exercise per Session:   Stress:   . Feeling of Stress :   Social Connections:   . Frequency of Communication with Friends and Family:   . Frequency of Social Gatherings with Friends and Family:   . Attends Religious Services:   . Active Member of Clubs or Organizations:   . Attends Banker Meetings:   Marland Kitchen Marital Status:   Intimate Partner Violence:   . Fear of Current or Ex-Partner:   . Emotionally Abused:   Marland Kitchen Physically Abused:   . Sexually Abused:    No current facility-administered medications on file prior to encounter.   No current outpatient medications on file prior to encounter.   No Known Allergies  ROS:  Review of Systems  Constitutional: Negative for chills, fatigue and fever.  Respiratory: Negative for shortness of breath.   Cardiovascular: Negative for chest pain.  Gastrointestinal: Positive for abdominal pain. Negative for  nausea and vomiting.  Genitourinary: Positive for pelvic pain. Negative for difficulty urinating, dysuria, flank pain, vaginal bleeding, vaginal discharge and vaginal pain.  Neurological: Negative for dizziness and headaches.  Psychiatric/Behavioral: Negative.      I have reviewed patient's Past Medical Hx, Surgical Hx, Family Hx, Social Hx, medications and allergies.   Physical Exam    BP (!) 115/56 (BP Location: Right Arm)   Pulse 70   Temp 99 F (37.2 C) (Oral)   Resp 16   LMP  (LMP Unknown)   SpO2 100%  Constitutional: Well-developed, well-nourished female in no acute distress.  Cardiovascular: normal  rate Respiratory: normal effort GI: Abd soft, non-tender. Pos BS x 4 MS: Extremities nontender, no edema, normal ROM Neurologic: Alert and oriented x 4.  GU: Neg CVAT.  PELVIC EXAM: Wet prep/GC collected by blind swab   LAB RESULTS   Recent Results (from the past 2160 hour(s))  Cervicovaginal ancillary only( [place])     Status: None   Collection Time: 01/16/20  2:35 PM  Result Value Ref Range   Neisseria Gonorrhea Negative    Chlamydia Negative    Trichomonas Negative    Bacterial Vaginitis (gardnerella) Negative    Candida Vaginitis Negative    Candida Glabrata Negative    Comment      Normal Reference Range Bacterial Vaginosis - Negative   Comment Normal Reference Ranger Chlamydia - Negative    Comment      Normal Reference Range Neisseria Gonorrhea - Negative   Comment Normal Reference Range Candida Species - Negative    Comment Normal Reference Range Candida Galbrata - Negative    Comment Normal Reference Range Trichomonas - Negative   Urine Culture     Status: None   Collection Time: 01/16/20  3:22 PM   Specimen: Urine   UR  Result Value Ref Range   Urine Culture, Routine Final report    Organism ID, Bacteria No growth   POCT urinalysis dip (device)     Status: Abnormal   Collection Time: 01/16/20  3:23 PM  Result Value Ref Range   Glucose, UA NEGATIVE NEGATIVE mg/dL   Bilirubin Urine NEGATIVE NEGATIVE   Ketones, ur NEGATIVE NEGATIVE mg/dL   Specific Gravity, Urine 1.015 1.005 - 1.030   Hgb urine dipstick NEGATIVE NEGATIVE   pH 8.5 (H) 5.0 - 8.0   Protein, ur 100 (A) NEGATIVE mg/dL   Urobilinogen, UA 0.2 0.0 - 1.0 mg/dL   Nitrite NEGATIVE NEGATIVE   Leukocytes,Ua NEGATIVE NEGATIVE    Comment: Biochemical Testing Only. Please order routine urinalysis from main lab if confirmatory testing is needed.  Pregnancy, urine POC     Status: Abnormal   Collection Time: 01/16/20  3:24 PM  Result Value Ref Range   Preg Test, Ur POSITIVE (A) NEGATIVE    Comment:         THE SENSITIVITY OF THIS METHODOLOGY IS >24 mIU/mL   GC/Chlamydia probe amp ([place])not at [facility]     Status: None   Collection Time: 01/16/20  6:26 PM  Result Value Ref Range   Neisseria Gonorrhea Negative    Chlamydia Negative    Comment Normal Reference Ranger Chlamydia - Negative    Comment      Normal Reference Range Neisseria Gonorrhea - Negative  Wet prep, genital     Status: Abnormal   Collection Time: 01/16/20  6:52 PM   Specimen: PATH Cytology Cervicovaginal Ancillary Only  Result Value Ref Range   Yeast Wet Prep  HPF POC NONE SEEN NONE SEEN   Trich, Wet Prep NONE SEEN NONE SEEN   Clue Cells Wet Prep HPF POC NONE SEEN NONE SEEN   WBC, Wet Prep HPF POC MANY (A) NONE SEEN   Sperm NONE SEEN     Comment: Performed at [facility], [address] [place], [place] [address]  CBC     Status: Abnormal   Collection Time: 01/16/20  7:07 PM  Result Value Ref Range   WBC 4.9 4.0 - 10.5 K/uL   RBC 3.82 (L) 3.87 - 5.11 MIL/uL   Hemoglobin 11.7 (L) 12.0 - 15.0 g/dL   HCT 70.7 (L) 36 - 46 %   MCV 93.5 80.0 - 100.0 fL   MCH 30.6 26.0 - 34.0 pg   MCHC 32.8 30.0 - 36.0 g/dL   RDW 86.7 54.4 - 92.0 %   Platelets 185 150 - 400 K/uL   nRBC 0.0 0.0 - 0.2 %    Comment: Performed at [facility], [address], [place], [place] [address]  hCG, quantitative, pregnancy     Status: Abnormal   Collection Time: 01/16/20  7:07 PM  Result Value Ref Range   hCG, Beta Chain, Quant, S 202,311 (H) <5 mIU/mL    Comment:          GEST. AGE      CONC.  (mIU/mL)   <=1 WEEK        5 - 50     2 WEEKS       50 - 500     3 WEEKS       100 - 10,000     4 WEEKS     1,000 - 30,000     5 WEEKS     3,500 - 115,000   6-8 WEEKS     12,000 - 270,000    12 WEEKS     15,000 - 220,000        FEMALE AND NON-PREGNANT FEMALE:     LESS THAN 5 mIU/mL Performed at [facility], [address], [place], [place] [address]   HIV Antibody (routine testing w rflx)     Status: None   Collection  Time: 01/16/20  7:07 PM  Result Value Ref Range   HIV Screen 4th Generation wRfx Non Reactive Non Reactive    Comment: Performed at [facility], [address] [place], [place] [address]      IMAGING [place] Comp Less 14 Wks  Result Date: 01/16/2020 CLINICAL DATA:  Abdominal pain. First trimester pregnancy. LMP 07/27/2019. EXAM: OBSTETRIC <14 WK ULTRASOUND TECHNIQUE: Transabdominal ultrasound was performed for evaluation of the gestation as well as the maternal uterus and adnexal regions. COMPARISON:  None. FINDINGS: Intrauterine gestational sac: Visualized/normal in shape. Yolk sac:  Visualized. Embryo:  Visualized. Cardiac Activity: Visualized. Heart Rate: 164 bpm CRL:  20.8 mm; 8 w 4 d;                  [place] EDC: 08/23/2020 Subchorionic hemorrhage: None. Maternal uterus/adnexae: Both maternal ovaries are visualized. No adnexal mass or significant free pelvic fluid. IMPRESSION: 1. Single live intrauterine gestation with best estimated gestational age of [redacted] weeks 4 days. 2. No placental or adnexal abnormality identified. Electronically Signed   By: Carey Bullocks M.D.   On: 01/16/2020 19:39    MAU Management/MDM: Orders Placed This Encounter  Procedures  . Wet prep, genital  . US OB Comp Less 14  Wks  . CBC  . hCG, quantitative, pregnancy  . HIV Antibody (routine testing w rflx)  . Discharge patient    Meds ordered this encounter  Medications  . Doxylamine-Pyridoxine 10-10 MG TBEC    Sig: Take 2 tablets by mouth at bedtime.    Dispense:  60 tablet    Refill:  1    Order Specific Question:   Supervising Provider    Answer:   Merrily Pew    [place] phone interpreter used for all communication.  Pt reports pain is chronic, and was not resolved when her IUD was removed. She does report some worsening of the pain in the last few days but was surprised about the pregnancy today.  There is no acute abdomen, no emergency findings today.  If Korea is normal, pt to start prenatal  care and if chronic pain persists, further workup/evaluation is warranted.    [place] results pending, report to Gavin Pound, CNM.    Pt discharged with IUP noted on today's [place], see separate note by Gavin Pound, CNM.  ASSESSMENT 1. Intrauterine pregnancy   2. Abdominal pain during pregnancy in first trimester   3. [redacted] weeks gestation of pregnancy   4. Pelvic pain     PLAN Discharge home Allergies as of 01/16/2020   No Known Allergies     Medication List    STOP taking these medications   fluticasone 50 MCG/ACT nasal spray Commonly known as: FLONASE   ibuprofen 200 MG tablet Commonly known as: ADVIL   loratadine 10 MG tablet Commonly known as: CLARITIN   norgestimate-ethinyl estradiol 0.25-35 MG-MCG tablet Commonly known as: ORTHO-CYCLEN   olopatadine 0.1 % ophthalmic solution Commonly known as: Patanol     TAKE these medications   Doxylamine-Pyridoxine 10-10 MG Tbec Take 2 tablets by mouth at bedtime.       Follow-up [place] for [facility] at [facility]. Schedule an appointment as soon as possible for a visit.   Specialty: Obstetrics and Gynecology Why: Prenatal Care Contact information: [place] [phone]              Fatima Blank Certified Nurse-Midwife 01/20/2020  8:25 AM

## 2020-01-21 LAB — CYTOLOGY - PAP
Comment: NEGATIVE
Diagnosis: UNDETERMINED — AB
High risk HPV: NEGATIVE

## 2020-01-22 ENCOUNTER — Telehealth: Payer: Self-pay

## 2020-01-22 NOTE — Telephone Encounter (Signed)
Called pt using [employer] Interpreter [employer] id# [phone], regarding test results, no answer. Left VM for pt to call back.

## 2020-01-22 NOTE — Telephone Encounter (Signed)
-----   Message from Nicole E Nugent, NP sent at 01/17/2020  2:32 PM EDT ----- Please call pt to inform of negative swab results. Patient does not have MyChart. Thank you, Nicole 

## 2020-01-22 NOTE — Telephone Encounter (Signed)
Called Pt using [employer] Interpreter [employer] ID# [phone] to advise Pt of test results, no answer, left VM for Pt to call back.

## 2020-01-22 NOTE — Telephone Encounter (Signed)
-----   Message from Sandra Ponto, NP sent at 01/17/2020  2:32 PM EDT ----- Please call pt to inform of negative swab results. Patient does not have MyChart. Thank you, [name]

## 2020-02-21 ENCOUNTER — Ambulatory Visit: Payer: Medicaid Other | Admitting: Physical Therapy

## 2020-02-21 ENCOUNTER — Other Ambulatory Visit: Payer: Self-pay

## 2020-02-21 ENCOUNTER — Ambulatory Visit: Payer: Medicaid Other | Attending: Women's Health | Admitting: Physical Therapy

## 2020-02-21 DIAGNOSIS — M6281 Muscle weakness (generalized): Secondary | ICD-10-CM | POA: Insufficient documentation

## 2020-02-21 DIAGNOSIS — R293 Abnormal posture: Secondary | ICD-10-CM | POA: Insufficient documentation

## 2020-02-21 DIAGNOSIS — R252 Cramp and spasm: Secondary | ICD-10-CM | POA: Insufficient documentation

## 2020-02-21 NOTE — Therapy (Addendum)
[facility] [address], [address] [place], [place], [address] Phone: [phone]   Fax:  [phone]  Physical Therapy Evaluation  Patient Details  Name: Sandra Sexton MRN: [id number] Date of Birth: 10-Sep-1995 Referring Provider (PT): Donia Ast, NP   Encounter Date: 02/21/2020   PT End of Session - 02/22/20 1241    Visit Number 1    Date for PT Re-Evaluation 05/15/20    Authorization Type medicaid - [place]    PT Start Time 1147    PT Stop Time 1227    PT Time Calculation (min) 40 min    Activity Tolerance Patient tolerated treatment well;Patient limited by pain    Behavior During Therapy [facility] for tasks assessed/performed           Past Medical History:  Diagnosis Date  . H/O malaria 2015   [place]    Past Surgical History:  Procedure Laterality Date  . NO PAST SURGERIES      There were no vitals filed for this visit.    Subjective Assessment - 02/22/20 1957    Subjective Pt is 3 month pregnant and having pain Rt abdomen from low up to mid Rt side. This pain started when I had IUD.    Patient Stated Goals stop having pain    Currently in Pain? Yes    Pain Score 7     Pain Location Abdomen    Pain Orientation Right;Lower    Pain Descriptors / Indicators Cramping;Sore    Pain Type Chronic pain    Pain Radiating Towards Rt side hip to ribcage    Pain Onset More than a month ago    Aggravating Factors  pain is there all the time    Pain Relieving Factors not sure    Multiple Pain Sites No              [facility] PT Assessment - 02/22/20 0001      Assessment   Medical Diagnosis R10.2 pelvic pain    Referring Provider (PT) Donia Ast, NP    Prior Therapy No      Precautions   Precaution Comments 3 months pregnant      Balance Screen   Has the patient fallen in the past 6 months No      Home Environment   Living Environment Private residence    Living Arrangements  Spouse/significant other;Children      Prior Function   Level of Independence Independent      Cognition   Overall Cognitive Status Within Functional Limits for tasks assessed      Posture/Postural Control   Posture/Postural Control Postural limitations    Postural Limitations Anterior pelvic tilt      ROM / Strength   AROM / PROM / Strength AROM;PROM;Strength      PROM   Overall PROM Comments hip ER Rt 25% limited Lt 20% limited      Strength   Overall Strength Comments Lt flexion and ext 4+/5 and increased pain; Rt abd 4/5 +pain      Flexibility   Soft Tissue Assessment /Muscle Length yes    Hamstrings normal      Ambulation/Gait   Gait Pattern Within Functional Limits                      Objective measurements completed on examination: See above findings.     Pelvic Floor Special Questions - 02/22/20 0001  Are you Pregnant or attempting pregnancy? Yes    Prior Pregnancies Yes    Number of Pregnancies 4   one is current   Number of Vaginal Deliveries 3    Any difficulty with labor and deliveries No    Episiotomy Performed No    Currently Sexually Active Yes    Is this Painful Yes    Marinoff Scale pain prevents any attempts at intercourse    Urinary Leakage No    Urinary urgency Yes    Urinary frequency goes a lot    Falling out feeling (prolapse) No    Pelvic Floor Internal Exam pt identity confirmed and internal soft tissue assessment done with permission    Exam Type Vaginal    Palpation severe TTP throughout pelvic floor Rt>Lt TTP adductors on Rt side    Strength weak squeeze, no lift    Strength # of reps --   3/10 sec   Strength # of seconds 5    Tone high                         PT Long Term Goals - 02/22/20 1921      PT LONG TERM GOAL #1   Title Pt will report at least 50% less pain    Baseline 7/10    Time 12    Period Weeks    Status New    Target Date 05/15/20      PT LONG TERM GOAL #2   Title Pt will be  ind with advanced HEP to maintain core strength throughout pregnancy    Baseline does not know    Time 12    Period Weeks    Status New    Target Date 05/15/20      PT LONG TERM GOAL #3   Title Pt will be able to be intimate with her husband without pain    Baseline unable due to pain    Time 12    Period Weeks    Status New    Target Date 05/15/20      PT LONG TERM GOAL #4   Title ind with urge techniques    Baseline does not know, having urgency    Time 12    Period Weeks    Status New    Target Date 05/15/20                  Plan - 02/22/20 1923    Clinical Impression Statement Pt presents to clinic today with pain along Rt abdomen.  Pt states pain started prior to pregnancy with IUD.  Pt has decreased ROM with pain in hips as stated above.  Pt has anterior pelvic tilt.  She has pelvic floor weakness of 2/5 with high tone and hold for 5 seconds.  Pt has weakness in hips with pain during MMT.  Pt is very TTP throughout pelvic floor  and Rt adductors.    Personal Factors and Comorbidities Time since onset of injury/illness/exacerbation;Other;Comorbidity 2    Comorbidities IUD chronic pain; multiple vaginal deliveries    Examination-Activity Limitations Toileting;Caring for Others;Sleep    Examination-Participation Restrictions Interpersonal Relationship;Cleaning;Community Activity    Stability/Clinical Decision Making Evolving/Moderate complexity    Clinical Decision Making Moderate    Rehab Potential Good    PT Frequency 1x / week    PT Duration 12 weeks    PT Treatment/Interventions ADLs/Self Care Home Management;Biofeedback;Cryotherapy;Electrical Stimulation;Neuromuscular re-education;Therapeutic exercise;Therapeutic activities;Moist Heat;Passive range of  motion;Manual techniques;Taping;Patient/family education    PT Next Visit Plan breathing and stretching; abdominal fascial release; urge techniques    Consulted and Agree with Plan of Care Patient            Patient will benefit from skilled therapeutic intervention in order to improve the following deficits and impairments:  Pain, Postural dysfunction, Decreased strength, Decreased coordination, Decreased range of motion  Visit Diagnosis: Cramp and spasm - Plan: PT plan of care cert/re-cert  Muscle weakness (generalized) - Plan: PT plan of care cert/re-cert  Abnormal posture - Plan: PT plan of care cert/re-cert     Problem List Patient Active Problem List   Diagnosis Date Noted  . Pelvic pain 02/12/2019  . Language barrier affecting health care 05/20/2015    Junious Silk ,PT 02/22/2020, 7:59 PM  [facility] [address] [place], [place], [address] Phone: [phone]   Fax:  [phone]  Name: Sandra Sexton MRN: [id number] Date of Birth: [birth year]

## 2020-02-22 ENCOUNTER — Encounter: Payer: Self-pay | Admitting: Physical Therapy

## 2020-02-22 NOTE — Addendum Note (Signed)
Addended by: Dwana Curd L on: 02/22/2020 08:00 PM   Modules accepted: Orders

## 2020-02-28 ENCOUNTER — Ambulatory Visit: Payer: Medicaid Other | Admitting: Physical Therapy

## 2020-03-03 ENCOUNTER — Other Ambulatory Visit: Payer: Self-pay

## 2020-03-03 ENCOUNTER — Ambulatory Visit: Payer: Medicaid Other | Attending: Women's Health | Admitting: Physical Therapy

## 2020-03-03 ENCOUNTER — Encounter: Payer: Self-pay | Admitting: Physical Therapy

## 2020-03-03 DIAGNOSIS — M6281 Muscle weakness (generalized): Secondary | ICD-10-CM | POA: Insufficient documentation

## 2020-03-03 DIAGNOSIS — R293 Abnormal posture: Secondary | ICD-10-CM | POA: Diagnosis present

## 2020-03-03 DIAGNOSIS — R252 Cramp and spasm: Secondary | ICD-10-CM | POA: Diagnosis present

## 2020-03-03 NOTE — Patient Instructions (Addendum)
Urge Incontinence  . Ideal urination frequency is every 2-4 wakeful hours, which equates to 5-8 times within a 24-hour period.   . Urge incontinence is leakage that occurs when the bladder muscle contracts, creating a sudden need to go before getting to the bathroom.   . Going too often when your bladder isn't actually full can disrupt the body's automatic signals to store and hold urine longer, which will increase urgency/frequency.  In this case, the bladder "is running the show" and strategies can be learned to retrain this pattern.   . One should be able to control the first urge to urinate, at around .  The bladder can hold up to a "grande latte," or . . To help you gain control, practice the Urge Drill below when urgency strikes.  This drill will help retrain your bladder signals and allow you to store and hold urine longer.  The overall goal is to stretch out your time between voids to reach a more manageable voiding schedule.    . Practice your "quick flicks" often throughout the day (each waking hour) even when you don't need feel the urge to go.  This will help strengthen your pelvic floor muscles, making them more effective in controlling leakage.  Urge Drill  When you feel an urge to go, follow these steps to regain control: 1. Stop what you are doing and be still 2. Take one deep breath, directing your air into your abdomen 3. Think an affirming thought, such as "I've got this." 4. Do 5 quick flicks of your pelvic floor 5. Walk with control to the bathroom to void, or delay voiding   Access Code: [email or web address] URL: [email or web address] Date: 03/03/2020 Prepared by: Dwana Curd  Exercises Quadruped Transversus Abdominis Bracing - 1 x daily - 7 x weekly - 10 reps - 2 sets - 5 sec hold Quadruped Alternating Shoulder Flexion - 1 x daily - 7 x weekly - 3 sets - 10 reps Quadruped Rocking Slow - 1 x daily - 7 x weekly - 3 sets - 10 reps

## 2020-03-03 NOTE — Therapy (Signed)
[facility] [address] [place], [place], [address] Phone: [phone]   Fax:  [phone]  Physical Therapy Treatment  Patient Details  Name: Sandra Sexton MRN: [id number] Date of Birth: 1996-06-17 Referring Provider (PT): Donia Ast, NP   Encounter Date: 03/03/2020   PT End of Session - 03/03/20 1747    Visit Number 2    Date for PT Re-Evaluation 05/15/20    Authorization Type medicaid - [place] medicaid    PT Start Time 1530    PT Stop Time 1605   interpreter had to leave early   PT Time Calculation (min) 35 min    Activity Tolerance Patient tolerated treatment well;Patient limited by pain    Behavior During Therapy [facility] for tasks assessed/performed           Past Medical History:  Diagnosis Date  . H/O malaria 2015   [place]    Past Surgical History:  Procedure Laterality Date  . NO PAST SURGERIES      There were no vitals filed for this visit.   Subjective Assessment - 03/03/20 1535    Subjective Pt states when she delays urge to void she has the abdominal pain, reports has been feeling better after initial eval due to advice given at that appointment    Patient is accompained by: Interpreter    Patient Stated Goals stop having pain    Currently in Pain? Yes    Pain Score 7     Pain Location Abdomen    Pain Orientation Right                             [facility] Adult PT Treatment/Exercise - 03/03/20 0001      Self-Care   Self-Care Other Self-Care Comments    Other Self-Care Comments  urge techniques      Exercises   Exercises Lumbar      Lumbar Exercises: Quadruped   Other Quadruped Lumbar Exercises TrA activation with rocking and UE reaches      Manual Therapy   Manual Therapy Myofascial release    Manual therapy comments side lying with pillows                  PT Education - 03/03/20 1730    Education Details Access Code: [email or web address]      Person(s) Educated Patient    Methods Explanation;Demonstration;Verbal cues;Handout;Tactile cues    Comprehension Verbalized understanding;Returned demonstration               PT Long Term Goals - 02/22/20 1921      PT LONG TERM GOAL #1   Title Pt will report at least 50% less pain    Baseline 7/10    Time 12    Period Weeks    Status New    Target Date 05/15/20      PT LONG TERM GOAL #2   Title Pt will be ind with advanced HEP to maintain core strength throughout pregnancy    Baseline does not know    Time 12    Period Weeks    Status New    Target Date 05/15/20      PT LONG TERM GOAL #3   Title Pt will be able to be intimate with her husband without pain    Baseline unable due to pain    Time 12    Period Weeks  Status New    Target Date 05/15/20      PT LONG TERM GOAL #4   Title ind with urge techniques    Baseline does not know, having urgency    Time 12    Period Weeks    Status New    Target Date 05/15/20                 Plan - 03/03/20 1731    Clinical Impression Statement Pt responded well to fascial release.  She was able to initiate core strengthening exercises and had reduced pain after today's treatment.  She was recommended to get an SI belt due to feeling better with pelvic compression for stability.  Pt will benefit from skilled PT to work on core strength for improved function during pregnancy.    PT Treatment/Interventions ADLs/Self Care Home Management;Biofeedback;Cryotherapy;Electrical Stimulation;Neuromuscular re-education;Therapeutic exercise;Therapeutic activities;Moist Heat;Passive range of motion;Manual techniques;Taping;Patient/family education    PT Next Visit Plan abdominal fascial release at end of treatment if needed; core strength and posture    PT Home Exercise Plan Access Code: [email or web address]    Recommended Other Services SI stability belt    Consulted and Agree with Plan of Care Patient           Patient will benefit  from skilled therapeutic intervention in order to improve the following deficits and impairments:  Pain, Postural dysfunction, Decreased strength, Decreased coordination, Decreased range of motion  Visit Diagnosis: Cramp and spasm  Muscle weakness (generalized)  Abnormal posture     Problem List Patient Active Problem List   Diagnosis Date Noted  . Pelvic pain 02/12/2019  . Language barrier affecting health care 05/20/2015    Junious Silk, PT 03/03/2020, 5:48 PM  [facility] [address] [place], [place], [address] Phone: [phone]   Fax:  [phone]  Name: Atticus Lemberger MRN: [id number] Date of Birth: 1996-06-09

## 2020-03-06 ENCOUNTER — Encounter: Payer: Self-pay | Admitting: Physical Therapy

## 2020-03-18 ENCOUNTER — Ambulatory Visit: Payer: Medicaid Other | Admitting: Physical Therapy

## 2020-03-18 ENCOUNTER — Other Ambulatory Visit: Payer: Self-pay

## 2020-03-18 ENCOUNTER — Encounter: Payer: Self-pay | Admitting: Physical Therapy

## 2020-03-18 DIAGNOSIS — R252 Cramp and spasm: Secondary | ICD-10-CM | POA: Diagnosis not present

## 2020-03-18 DIAGNOSIS — M6281 Muscle weakness (generalized): Secondary | ICD-10-CM

## 2020-03-18 DIAGNOSIS — R293 Abnormal posture: Secondary | ICD-10-CM

## 2020-03-18 NOTE — Therapy (Signed)
[facility] [address] [place], [place], [address] Phone: [phone]   Fax:  [phone]  Physical Therapy Treatment  Patient Details  Name: Sandra Sexton MRN: [id number] Date of Birth: 1995-09-08 Referring Provider (PT): Donia Ast, NP   Encounter Date: 03/18/2020   PT End of Session - 03/18/20 1529    Visit Number 3    Date for PT Re-Evaluation 05/15/20    Authorization Type medicaid - [place] medicaid    PT Start Time 1529    PT Stop Time 1613    PT Time Calculation (min) 44 min    Activity Tolerance Patient tolerated treatment well;Patient limited by pain    Behavior During Therapy [facility] for tasks assessed/performed           Past Medical History:  Diagnosis Date  . H/O malaria 2015   [place]    Past Surgical History:  Procedure Laterality Date  . NO PAST SURGERIES      There were no vitals filed for this visit.   Subjective Assessment - 03/18/20 1535    Subjective Pt states the exercises went well at home. Yesterday my right side was hurting and couldn't sleep on it.  Right now it feels good.    Patient is accompained by: Interpreter    Patient Stated Goals stop having pain    Currently in Pain? No/denies                             [facility] Adult PT Treatment/Exercise - 03/18/20 0001      Self-Care   Other Self-Care Comments  self massage to perineum demo with pelvis      Lumbar Exercises: Stretches   Lower Trunk Rotation 5 reps;10 seconds      Lumbar Exercises: Aerobic   Nustep L2 x 5 min PT present for status update and cues to activate abdominals      Lumbar Exercises: Standing   Other Standing Lumbar Exercises pelvic tilt on the wall - 10x      Lumbar Exercises: Supine   Pelvic Tilt 15 reps;5 seconds    Bent Knee Raise 10 reps      Lumbar Exercises: Sidelying   Clam Right;Left;15 reps      Manual Therapy   Manual Therapy Myofascial  release    Manual therapy comments sidelying    Myofascial Release lumbar and gluteal MFR and STM                  PT Education - 03/18/20 1619    Education Details Access Code: [email or web address]    Person(s) Educated Patient    Methods Explanation;Demonstration;Tactile cues;Verbal cues;Handout    Comprehension Verbalized understanding;Returned demonstration               PT Long Term Goals - 03/18/20 1544      PT LONG TERM GOAL #1   Title Pt will report at least 50% less pain    Status On-going      PT LONG TERM GOAL #2   Title Pt will be ind with advanced HEP to maintain core strength throughout pregnancy    Status On-going      PT LONG TERM GOAL #3   Title Pt will be able to be intimate with her husband without pain    Baseline a little better    Status On-going      PT LONG TERM  GOAL #4   Title ind with urge techniques    Baseline it is better using the urge techniques    Status Achieved                 Plan - 03/18/20 1620    Clinical Impression Statement Pt is making improvements as seen in goal updates.  Pt was able to do more and tolerated lying on her back.  Pt has reduced back and abdominal pain with pelvic tilt.  Pt has been doing her exrecises at home successfully.  Overall will benefit from skilled PT to continue to work towards functional goals.    PT Treatment/Interventions ADLs/Self Care Home Management;Biofeedback;Cryotherapy;Electrical Stimulation;Neuromuscular re-education;Therapeutic exercise;Therapeutic activities;Moist Heat;Passive range of motion;Manual techniques;Taping;Patient/family education    PT Next Visit Plan core strength pelvic tilts    PT Home Exercise Plan Access Code: [email or web address]    Consulted and Agree with Plan of Care Patient           Patient will benefit from skilled therapeutic intervention in order to improve the following deficits and impairments:  Pain, Postural dysfunction, Decreased strength, Decreased coordination,  Decreased range of motion  Visit Diagnosis: Cramp and spasm  Muscle weakness (generalized)  Abnormal posture     Problem List Patient Active Problem List   Diagnosis Date Noted  . Pelvic pain 02/12/2019  . Language barrier affecting health care 05/20/2015    Junious Silk, PT 03/18/2020, 5:13 PM  [facility] [address] [place], [place], [address] Phone: [phone]   Fax:  [phone]  Name: Sandra Sexton MRN: [id number] Date of Birth: 04-05-1996

## 2020-03-18 NOTE — Patient Instructions (Signed)
Access Code: [email or web address] URL: [email or web address] Date: 03/18/2020 Prepared by: Dwana Curd  Exercises Quadruped Transversus Abdominis Bracing - 1 x daily - 7 x weekly - 10 reps - 2 sets - 5 sec hold Quadruped Alternating Shoulder Flexion - 1 x daily - 7 x weekly - 3 sets - 10 reps Quadruped Rocking Slow - 1 x daily - 7 x weekly - 3 sets - 10 reps Clamshell - 1 x daily - 7 x weekly - 3 sets - 10 reps Supine Pelvic Tilt - 1 x daily - 7 x weekly - 3 sets - 10 reps Standing with Back Flat Against Wall - 1 x daily - 7 x weekly - 10 reps - 1 sets - 5 sec hold Supine Lower Trunk Rotation - 1 x daily - 7 x weekly - 10 reps - 1 sets - 5 sec hold

## 2020-03-25 ENCOUNTER — Other Ambulatory Visit: Payer: Self-pay

## 2020-03-25 ENCOUNTER — Ambulatory Visit (INDEPENDENT_AMBULATORY_CARE_PROVIDER_SITE_OTHER): Payer: Medicaid Other | Admitting: Women's Health

## 2020-03-25 ENCOUNTER — Encounter: Payer: Self-pay | Admitting: Women's Health

## 2020-03-25 VITALS — BP 101/68 | HR 86 | Wt 164.0 lb

## 2020-03-25 DIAGNOSIS — O2342 Unspecified infection of urinary tract in pregnancy, second trimester: Secondary | ICD-10-CM | POA: Diagnosis not present

## 2020-03-25 DIAGNOSIS — Z36 Encounter for antenatal screening for chromosomal anomalies: Secondary | ICD-10-CM | POA: Diagnosis not present

## 2020-03-25 DIAGNOSIS — Z369 Encounter for antenatal screening, unspecified: Secondary | ICD-10-CM | POA: Diagnosis not present

## 2020-03-25 DIAGNOSIS — Z3490 Encounter for supervision of normal pregnancy, unspecified, unspecified trimester: Secondary | ICD-10-CM | POA: Insufficient documentation

## 2020-03-25 LAB — POCT URINALYSIS DIP (DEVICE)
Bilirubin Urine: NEGATIVE
Glucose, UA: NEGATIVE mg/dL
Hgb urine dipstick: NEGATIVE
Ketones, ur: NEGATIVE mg/dL
Leukocytes,Ua: NEGATIVE
Nitrite: NEGATIVE
Protein, ur: 30 mg/dL — AB
Specific Gravity, Urine: 1.015 (ref 1.005–1.030)
Urobilinogen, UA: 0.2 mg/dL (ref 0.0–1.0)
pH: 8.5 — ABNORMAL HIGH (ref 5.0–8.0)

## 2020-03-25 MED ORDER — ASPIRIN EC 81 MG PO TBEC: 81.0000 mg | DELAYED_RELEASE_TABLET | Freq: Every day | ORAL | 11 refills | Status: DC

## 2020-03-25 MED ORDER — BLOOD PRESSURE KIT DEVI: 1.0000 | Freq: Every day | 0 refills | Status: DC

## 2020-03-25 NOTE — Progress Notes (Signed)
History:   Sandra Sexton is a 24 y.o. [phone] at 22w3dby early ultrasound being seen today for her first obstetrical visit.  Her obstetrical history is significant for obesity. Patient does intend to breast feed. Pregnancy history fully reviewed. Reviewed recent Pap results with patient and recommended f/u in 3 years.  Pt reports this is a desired and planned pregnancy. Allergies: NKDA Current Medications: none, RX sent for low dose ASA PMH: none. No HTN, DM, asthma. PSH: none OB Hx: full-term NSVD x3 Social Hx: pt does not smoke, drink, or use drugs. Family Hx: none  Patient reports no complaints.      HISTORY: OB History  Gravida Para Term Preterm AB Living  _0 0 0 3  SAB TAB Ectopic Multiple Live Births  0 0 0 0 3    # Outcome Date GA Lbr Len/2nd Weight Sex Delivery Anes PTL Lv  4 Current           3 Term 06/08/17 439w2d2:20 7 lb 12 oz (3.515 kg) M Vag-Spont None  LIV     Birth Comments: wnl     Name: Sandra Sexton     Apgar1: 7  Apgar5: 9  2 Term 11/15/15 4071w2d:42 / 00:03 7 lb 6 oz (3.345 kg) F Vag-Spont Other, None  LIV     Birth Comments: none     Name: Sandra Sexton     Apgar1: 8  Apgar5: 9  1 Term 11/03/13    F Vag-Spont  N LIV    Last pap smear was done 01/2020 and was ASCUS/HPV negative, repeat Pap in 3 years.  Past Medical History:  Diagnosis Date  . H/O malaria 2015   [place]   Past Surgical History:  Procedure Laterality Date  . NO PAST SURGERIES     History reviewed. No pertinent family history. Social History   Tobacco Use  . Smoking status: Never Smoker  . Smokeless tobacco: Never Used  Vaping Use  . Vaping Use: Never used  Substance Use Topics  . Alcohol use: No  . Drug use: No   No Known Allergies No current outpatient medications on file prior to visit.   No current facility-administered medications on file prior to visit.    Review of Systems Pertinent items noted in HPI and  remainder of comprehensive ROS otherwise negative. Physical Exam:   Vitals:   03/25/20 1407  BP: 101/68  Pulse: 86  Weight: 164 lb (74.4 kg)   Fetal Heart Rate (bpm): 159  Uterus:  Fundal Height: 20 cm  Pelvic Exam: Perineum: Not examined   Vulva: Not examined   Vagina:  Not examined   Cervix: Not examined   Adnexa: Not examined   Bony Pelvis: Not examined  System: General: well-developed, well-nourished female in no acute distress   Breasts:  Not examined   Skin: normal coloration and turgor, no rashes   Neurologic: oriented, normal, negative, normal mood   Extremities: normal strength, tone, and muscle mass, ROM of all joints is normal   HEENT PERRLA, extraocular movement intact and sclera clear, anicteric   Mouth/Teeth mucous membranes moist, pharynx normal without lesions and dental hygiene good   Neck supple and no masses   Cardiovascular: regular rate and rhythm   Respiratory:  no respiratory distress, normal breath sounds   Abdomen: soft, non-tender; bowel sounds normal; no masses,  no organomegaly    Assessment:    Pregnancy: [id number] Active Problem List  Diagnosis Date Noted  . Supervision of low-risk pregnancy 03/25/2020  . Pelvic pain 02/12/2019  . Language barrier affecting health care 05/20/2015     Plan:    1. Encounter for supervision of low-risk pregnancy, antepartum - Blood Pressure Monitoring (BLOOD PRESSURE KIT) DEVI; 1 Device by Does not apply route daily.  Dispense: 1 each; Refill: 0 - CBC/D/Plt+RPR+Rh+ABO+Rub Ab... - Culture, OB Urine - Hemoglobin A1c - [place] MFM OB COMP + 14 WK; Future - GC/Chlamydia probe amp ([place])not at [facility] - pt declined, stating no new partners since GC/CT/trich 01/2020 when pt was found to be pregnant at annual visit - Protein / creatinine ratio, urine - Comp Met (CMET) - aspirin EC 81 MG tablet; Take 1 tablet (81 mg total) by mouth daily. Swallow whole.  Dispense: 30 tablet; Refill: 11 - AFP, Serum, Open  Spina Bifida  Initial labs drawn. Continue prenatal vitamins. Problem list reviewed and updated. Genetic Screening discussed, NIPS: declined. Ultrasound discussed; fetal anatomic survey: ordered. Anticipatory guidance about prenatal visits given including labs, ultrasounds, and testing. Discussed usage of Babys[employer]s and virtual visits as additional source of managing and completing prenatal visits in midst of coronavirus and pandemic.   Encouraged to complete MyChart Registration for her ability to review results, send requests, and have questions addressed.  The nature of [place] [facility] with multiple MDs and Advanced Practice Providers was explained to patient; also emphasized that residents, students are part of our team. Routine obstetric precautions reviewed. Encouraged to seek out care at office or emergency room [facility] preferred) for urgent and/or emergent concerns. Return in about 4 weeks (around 04/22/2020) for in-person/LOB/APP OK, needs anatomy scan ASAP.     Clarisa Fling, NP  3:43 PM 03/25/2020

## 2020-03-25 NOTE — Patient Instructions (Addendum)
[facility] [address] for blood pressure cuff. A high blood pressure is 140/90.        Maternity Assessment Unit (MAU)  The Maternity Assessment Unit (MAU) is located at the [facility] at [facility]. The address is: [address], [place], [place], [place] [address]. Please see map below for additional directions.    The Maternity Assessment Unit is designed to help you during your pregnancy, and for up to 6 weeks after delivery, with any pregnancy- or postpartum-related emergencies, if you think you are in labor, or if your water has broken. For example, if you experience nausea and vomiting, vaginal bleeding, severe abdominal or pelvic pain, elevated blood pressure or other problems related to your pregnancy or postpartum time, please come to the Maternity Assessment Unit for assistance.                        Safe Medications in Pregnancy    Acne: Benzoyl Peroxide Salicylic Acid  Backache/Headache: Tylenol: 2 regular strength every 4 hours OR              2 Extra strength every 6 hours  Colds/Coughs/Allergies: Benadryl (alcohol free) 25 mg every 6 hours as needed Breath right strips Claritin Cepacol throat lozenges Chloraseptic throat spray Cold-Eeze- up to three times per day Cough drops, alcohol free Flonase (by prescription only) Guaifenesin Mucinex Robitussin DM (plain only, alcohol free) Saline nasal spray/drops Sudafed (pseudoephedrine) & Actifed ** use only after [redacted] weeks gestation and if you do not have high blood pressure Tylenol Vicks Vaporub Zinc lozenges Zyrtec   Constipation: Colace Ducolax suppositories Fleet enema Glycerin suppositories Metamucil Milk of magnesia Miralax Senokot Smooth move tea  Diarrhea: Kaopectate Imodium A-D  *NO pepto Bismol  Hemorrhoids: Anusol Anusol HC Preparation H Tucks  Indigestion: Tums Maalox Mylanta Zantac  Pepcid  Insomnia: Benadryl (alcohol  free) 25mg  every 6 hours as needed Tylenol PM Unisom, no Gelcaps  Leg Cramps: Tums MagGel  Nausea/Vomiting:  Bonine Dramamine Emetrol Ginger extract Sea bands Meclizine  Nausea medication to take during pregnancy:  Unisom (doxylamine succinate 25 mg tablets) Take one tablet daily at bedtime. If symptoms are not adequately controlled, the dose can be increased to a maximum recommended dose of two tablets daily (1/2 tablet in the morning, 1/2 tablet mid-afternoon and one at bedtime). Vitamin B6 100mg  tablets. Take one tablet twice a day (up to 200 mg per day).  Skin Rashes: Aveeno products Benadryl cream or 25mg  every 6 hours as needed Calamine Lotion 1% cortisone cream  Yeast infection: Gyne-lotrimin 7 Monistat 7   **If taking multiple medications, please check labels to avoid duplicating the same active ingredients **take medication as directed on the label ** Do not exceed 4000 mg of tylenol in 24 hours **Do not take medications that contain aspirin or ibuprofen         Second Trimester of Pregnancy The second trimester is from week 14 through week 27 (months 4 through 6). The second trimester is often a time when you feel your best. Your body has adjusted to being pregnant, and you begin to feel better physically. Usually, morning sickness has lessened or quit completely, you may have more energy, and you may have an increase in appetite. The second trimester is also a time when the fetus is growing rapidly. At the end of the sixth month, the fetus is about 9 inches long and weighs about 1 pounds. You  will likely begin to feel the baby move (quickening) between 16 and 20 weeks of pregnancy. Body changes during your second trimester Your body continues to go through many changes during your second trimester. The changes vary from woman to woman.  Your weight will continue to increase. You will notice your lower abdomen bulging out.  You may begin to get stretch  marks on your hips, abdomen, and breasts.  You may develop headaches that can be relieved by medicines. The medicines should be approved by your health care provider.  You may urinate more often because the fetus is pressing on your bladder.  You may develop or continue to have heartburn as a result of your pregnancy.  You may develop constipation because certain hormones are causing the muscles that push waste through your intestines to slow down.  You may develop hemorrhoids or swollen, bulging veins (varicose veins).  You may have back pain. This is caused by: ? Weight gain. ? Pregnancy hormones that are relaxing the joints in your pelvis. ? A shift in weight and the muscles that support your balance.  Your breasts will continue to grow and they will continue to become tender.  Your gums may bleed and may be sensitive to brushing and flossing.  Dark spots or blotches (chloasma, mask of pregnancy) may develop on your face. This will likely fade after the baby is born.  A dark line from your belly button to the pubic area (linea nigra) may appear. This will likely fade after the baby is born.  You may have changes in your hair. These can include thickening of your hair, rapid growth, and changes in texture. Some women also have hair loss during or after pregnancy, or hair that feels dry or thin. Your hair will most likely return to normal after your baby is born. What to expect at prenatal visits During a routine prenatal visit:  You will be weighed to make sure you and the fetus are growing normally.  Your blood pressure will be taken.  Your abdomen will be measured to track your baby's growth.  The fetal heartbeat will be listened to.  Any test results from the previous visit will be discussed. Your health care provider may ask you:  How you are feeling.  If you are feeling the baby move.  If you have had any abnormal symptoms, such as leaking fluid, bleeding, severe  headaches, or abdominal cramping.  If you are using any tobacco products, including cigarettes, chewing tobacco, and electronic cigarettes.  If you have any questions. Other tests that may be performed during your second trimester include:  Blood tests that check for: ? Low iron levels (anemia). ? High blood sugar that affects pregnant women (gestational diabetes) between 35 and 28 weeks. ? Rh antibodies. This is to check for a protein on red blood cells (Rh factor).  Urine tests to check for infections, diabetes, or protein in the urine.  An ultrasound to confirm the proper growth and development of the baby.  An amniocentesis to check for possible genetic problems.  Fetal screens for spina bifida and Down syndrome.  HIV (human immunodeficiency virus) testing. Routine prenatal testing includes screening for HIV, unless you choose not to have this test. Follow these instructions at home: Medicines  Follow your health care provider's instructions regarding medicine use. Specific medicines may be either safe or unsafe to take during pregnancy.  Take a prenatal vitamin that contains at least 600 micrograms (mcg) of folic acid.  If you develop constipation, try taking a stool softener if your health care provider approves. Eating and drinking   Eat a balanced diet that includes fresh fruits and vegetables, whole grains, good sources of protein such as meat, eggs, or tofu, and low-fat dairy. Your health care provider will help you determine the amount of weight gain that is right for you.  Avoid raw meat and uncooked cheese. These carry germs that can cause birth defects in the baby.  If you have low calcium intake from food, talk to your health care provider about whether you should take a daily calcium supplement.  Limit foods that are high in fat and processed sugars, such as fried and sweet foods.  To prevent constipation: ? Drink enough fluid to keep your urine clear or pale  yellow. ? Eat foods that are high in fiber, such as fresh fruits and vegetables, whole grains, and beans. Activity  Exercise only as directed by your health care provider. Most women can continue their usual exercise routine during pregnancy. Try to exercise for 30 minutes at least 5 days a week. Stop exercising if you experience uterine contractions.  Avoid heavy lifting, wear low heel shoes, and practice good posture.  A sexual relationship may be continued unless your health care provider directs you otherwise. Relieving pain and discomfort  Wear a good support bra to prevent discomfort from breast tenderness.  Take warm sitz baths to soothe any pain or discomfort caused by hemorrhoids. Use hemorrhoid cream if your health care provider approves.  Rest with your legs elevated if you have leg cramps or low back pain.  If you develop varicose veins, wear support hose. Elevate your feet for 15 minutes, 3-4 times a day. Limit salt in your diet. Prenatal Care  Write down your questions. Take them to your prenatal visits.  Keep all your prenatal visits as told by your health care provider. This is important. Safety  Wear your seat belt at all times when driving.  Make a list of emergency phone numbers, including numbers for family, friends, the hospital, and police and fire departments. General instructions  Ask your health care provider for a referral to a local prenatal education class. Begin classes no later than the beginning of month 6 of your pregnancy.  Ask for help if you have counseling or nutritional needs during pregnancy. Your health care provider can offer advice or refer you to specialists for help with various needs.  Do not use hot tubs, steam rooms, or saunas.  Do not douche or use tampons or scented sanitary pads.  Do not cross your legs for long periods of time.  Avoid cat litter boxes and soil used by cats. These carry germs that can cause birth defects in the  baby and possibly loss of the fetus by miscarriage or stillbirth.  Avoid all smoking, herbs, alcohol, and unprescribed drugs. Chemicals in these products can affect the formation and growth of the baby.  Do not use any products that contain nicotine or tobacco, such as cigarettes and e-cigarettes. If you need help quitting, ask your health care provider.  Visit your dentist if you have not gone yet during your pregnancy. Use a soft toothbrush to brush your teeth and be gentle when you floss. Contact a health care provider if:  You have dizziness.  You have mild pelvic cramps, pelvic pressure, or nagging pain in the abdominal area.  You have persistent nausea, vomiting, or diarrhea.  You have a bad smelling  vaginal discharge.  You have pain when you urinate. Get help right away if:  You have a fever.  You are leaking fluid from your vagina.  You have spotting or bleeding from your vagina.  You have severe abdominal cramping or pain.  You have rapid weight gain or weight loss.  You have shortness of breath with chest pain.  You notice sudden or extreme swelling of your face, hands, ankles, feet, or legs.  You have not felt your baby move in over an hour.  You have severe headaches that do not go away when you take medicine.  You have vision changes. Summary  The second trimester is from week 14 through week 27 (months 4 through 6). It is also a time when the fetus is growing rapidly.  Your body goes through many changes during pregnancy. The changes vary from woman to woman.  Avoid all smoking, herbs, alcohol, and unprescribed drugs. These chemicals affect the formation and growth your baby.  Do not use any tobacco products, such as cigarettes, chewing tobacco, and e-cigarettes. If you need help quitting, ask your health care provider.  Contact your health care provider if you have any questions. Keep all prenatal visits as told by your health care provider. This is  important. This information is not intended to replace advice given to you by your health care provider. Make sure you discuss any questions you have with your health care provider. Document Revised: 11/10/2018 Document Reviewed: 08/24/2016 Elsevier Patient Education  2020 [employer]        Alpha-Fetoprotein Test Why am I having this test? The alpha-fetoprotein test is most commonly used in pregnant women to help screen for birth defects in their unborn baby. It can be used to screen for birth defects, such as chromosome (DNA) abnormalities, problems with the brain or spinal cord, or problems with the abdominal wall of the unborn baby (fetus). The alpha-fetoprotein test may also be done for men or non-pregnant women to check for certain cancers. What is being tested? This test measures the amount of alpha-fetoprotein (AFP) in your blood. AFP is a protein that is made by the liver. Levels can be detected in the mother's blood during pregnancy, starting at 10 weeks and peaking at 16-18 weeks of the pregnancy. Abnormal levels can sometimes be a sign of a birth defect in the baby. Certain cancers can cause a high level of AFP in men and non-pregnant women. What kind of sample is taken?  A blood sample is required for this test. It is usually collected by inserting a needle into a blood vessel. How are the results reported? Your test results will be reported as values. Your health care provider will compare your results to normal ranges that were established after testing a large group of people (reference values). Reference values may vary among labs and hospitals. For this test, common reference values are:  Adult: Less than 40 ng/mL or less than 40 mcg/L (SI units).  Child younger than 1 year: Less than 30 ng/mL. If you are pregnant, the values may also vary based on how long you have been pregnant. What do the results mean? Results that are above the reference values in pregnant women  may indicate the following for the baby:  Neural tube defects, such as abnormalities of the spinal cord or brain.  Abdominal wall defects.  Multiple pregnancy such as twins.  Fetal distress or fetal death. Results that are above the reference values in men or  non-pregnant women may indicate:  Reproductive cancers, such as ovarian or testicular cancer.  Liver cancer.  Liver cell death.  Other types of cancer. Very low levels of AFP in pregnant women may indicate the following for the baby:  Down syndrome.  Fetal death. Talk with your health care provider about what your results mean. Questions to ask your health care provider Ask your health care provider, or the department that is doing the test:  When will my results be ready?  How will I get my results?  What are my treatment options?  What other tests do I need?  What are my next steps? Summary  The alpha-fetoprotein test is done on pregnant women to help screen for birth defects in their unborn baby.  Certain cancers can cause a high level of AFP in men and non-pregnant women.  For this test, a blood sample is usually collected by inserting a needle into a blood vessel.  Talk with your health care provider about what your results mean. This information is not intended to replace advice given to you by your health care provider. Make sure you discuss any questions you have with your health care provider. Document Revised: 07/01/2017 Document Reviewed: 02/22/2017 Elsevier Patient Education  2020 [employer].        Pap Test Why am I having this test? A Pap test, also called a Pap smear, is a screening test to check for signs of:  Cancer of the vagina, cervix, and uterus. The cervix is the lower part of the uterus that opens into the vagina.  Infection.  Changes that may be a sign that cancer is developing (precancerous changes). Women need this test on a regular basis. In general, you should have a  Pap test every 3 years until you reach menopause or age 29. Women aged 30-60 may choose to have their Pap test done at the same time as an HPV (human papillomavirus) test every 5 years (instead of every 3 years). Your health care provider may recommend having Pap tests more or less often depending on your medical conditions and past Pap test results. What kind of sample is taken?  Your health care provider will collect a sample of cells from the surface of your cervix. This will be done using a small cotton swab, plastic spatula, or brush. This sample is often collected during a pelvic exam, when you are lying on your back on an exam table with feet in footrests (stirrups). In some cases, fluids (secretions) from the cervix or vagina may also be collected. How do I prepare for this test?  Be aware of where you are in your menstrual cycle. If you are menstruating on the day of the test, you may be asked to reschedule.  You may need to reschedule if you have a known vaginal infection on the day of the test.  Follow instructions from your health care provider about: ? Changing or stopping your regular medicines. Some medicines can cause abnormal test results, such as digitalis and tetracycline. ? Avoiding douching or taking a bath the day before or the day of the test. Tell a health care provider about:  Any allergies you have.  All medicines you are taking, including vitamins, herbs, eye drops, creams, and over-the-counter medicines.  Any blood disorders you have.  Any surgeries you have had.  Any medical conditions you have.  Whether you are pregnant or may be pregnant. How are the results reported? Your test results will be  reported as either abnormal or normal. A false-positive result can occur. A false positive is incorrect because it means that a condition is present when it is not. A false-negative result can occur. A false negative is incorrect because it means that a condition is  not present when it is. What do the results mean? A normal test result means that you do not have signs of cancer of the vagina, cervix, or uterus. An abnormal result may mean that you have:  Cancer. A Pap test by itself is not enough to diagnose cancer. You will have more tests done in this case.  Precancerous changes in your vagina, cervix, or uterus.  Inflammation of the cervix.  An STD (sexually transmitted disease).  A fungal infection.  A parasite infection. Talk with your health care provider about what your results mean. Questions to ask your health care provider Ask your health care provider, or the department that is doing the test:  When will my results be ready?  How will I get my results?  What are my treatment options?  What other tests do I need?  What are my next steps? Summary  In general, women should have a Pap test every 3 years until they reach menopause or age 61.  Your health care provider will collect a sample of cells from the surface of your cervix. This will be done using a small cotton swab, plastic spatula, or brush.  In some cases, fluids (secretions) from the cervix or vagina may also be collected. This information is not intended to replace advice given to you by your health care provider. Make sure you discuss any questions you have with your health care provider. Document Revised: 03/28/2017 Document Reviewed: 03/28/2017 Elsevier Patient Education  2020 [employer].

## 2020-03-26 ENCOUNTER — Encounter: Payer: Self-pay | Admitting: Physical Therapy

## 2020-03-26 ENCOUNTER — Encounter: Payer: Self-pay | Admitting: *Deleted

## 2020-03-26 ENCOUNTER — Ambulatory Visit: Payer: Medicaid Other | Admitting: Physical Therapy

## 2020-03-26 DIAGNOSIS — R293 Abnormal posture: Secondary | ICD-10-CM

## 2020-03-26 DIAGNOSIS — M6281 Muscle weakness (generalized): Secondary | ICD-10-CM

## 2020-03-26 DIAGNOSIS — R252 Cramp and spasm: Secondary | ICD-10-CM

## 2020-03-26 LAB — AFP, SERUM, OPEN SPINA BIFIDA
AFP MoM: 1.24
AFP Value: 60.5 ng/mL
Gest. Age on Collection Date: 18.4 weeks
Maternal Age At EDD: 26 yr
OSBR Risk 1 IN: 10000
Test Results:: NEGATIVE
Weight: 164 [lb_av]

## 2020-03-26 LAB — CBC/D/PLT+RPR+RH+ABO+RUB AB...
Antibody Screen: NEGATIVE
Basophils Absolute: 0 10*3/uL (ref 0.0–0.2)
Basos: 0 %
EOS (ABSOLUTE): 0.1 10*3/uL (ref 0.0–0.4)
Eos: 1 %
HCV Ab: <0.1 s/co ratio (ref 0.0–0.9)
HIV Screen 4th Generation wRfx: NONREACTIVE
Hematocrit: 33.8 % — ABNORMAL LOW (ref 34.0–46.6)
Hemoglobin: 11.4 g/dL (ref 11.1–15.9)
Hepatitis B Surface Ag: NEGATIVE
Immature Grans (Abs): 0 10*3/uL (ref 0.0–0.1)
Immature Granulocytes: 0 %
Lymphocytes Absolute: 1.6 10*3/uL (ref 0.7–3.1)
Lymphs: 30 %
MCH: 31.8 pg (ref 26.6–33.0)
MCHC: 33.7 g/dL (ref 31.5–35.7)
MCV: 94 fL (ref 79–97)
Monocytes Absolute: 0.5 10*3/uL (ref 0.1–0.9)
Monocytes: 9 %
Neutrophils Absolute: 3.1 10*3/uL (ref 1.4–7.0)
Neutrophils: 60 %
Platelets: 149 10*3/uL — ABNORMAL LOW (ref 150–450)
RBC: 3.58 x10E6/uL — ABNORMAL LOW (ref 3.77–5.28)
RDW: 11.8 % (ref 11.7–15.4)
RPR Ser Ql: NONREACTIVE
Rh Factor: POSITIVE
Rubella Antibodies, IGG: 21.4 index (ref >0.99)
WBC: 5.4 10*3/uL (ref 3.4–10.8)

## 2020-03-26 LAB — COMPREHENSIVE METABOLIC PANEL
ALT: 7 IU/L (ref 0–32)
AST: 12 IU/L (ref 0–40)
Albumin/Globulin Ratio: 1.3 (ref 1.2–2.2)
Albumin: 4 g/dL (ref 3.9–5.0)
Alkaline Phosphatase: 61 IU/L (ref 48–121)
BUN/Creatinine Ratio: 11 (ref 9–23)
BUN: 5 mg/dL — ABNORMAL LOW (ref 6–20)
Bilirubin Total: <0.2 mg/dL (ref 0.0–1.2)
CO2: 21 mmol/L (ref 20–29)
Calcium: 8.7 mg/dL (ref 8.7–10.2)
Chloride: 102 mmol/L (ref 96–106)
Creatinine, Ser: 0.45 mg/dL — ABNORMAL LOW (ref 0.57–1.00)
GFR calc Af Amer: 161 mL/min/{1.73_m2} (ref >59)
GFR calc non Af Amer: 140 mL/min/{1.73_m2} (ref >59)
Globulin, Total: 3 g/dL (ref 1.5–4.5)
Glucose: 67 mg/dL (ref 65–99)
Potassium: 4.6 mmol/L (ref 3.5–5.2)
Sodium: 136 mmol/L (ref 134–144)
Total Protein: 7 g/dL (ref 6.0–8.5)

## 2020-03-26 LAB — PROTEIN / CREATININE RATIO, URINE
Creatinine, Urine: 116.3 mg/dL
Protein, Ur: 8.7 mg/dL
Protein/Creat Ratio: 75 mg/g creat (ref 0–200)

## 2020-03-26 LAB — HEMOGLOBIN A1C
Est. average glucose Bld gHb Est-mCnc: 100 mg/dL
Hgb A1c MFr Bld: 5.1 % (ref 4.8–5.6)

## 2020-03-26 LAB — HCV INTERPRETATION

## 2020-03-26 NOTE — Therapy (Addendum)
[facility] [address], [address] [place], [place], [address] Phone: [phone]   Fax:  [phone]  Physical Therapy Treatment  Patient Details  Name: Sandra Sexton MRN: [id number] Date of Birth: September 30, 1995 Referring Provider (PT): Donia Ast, NP   Encounter Date: 03/26/2020   PT End of Session - 03/26/20 1539    Visit Number 4    Date for PT Re-Evaluation 05/15/20    Authorization Type medicaid - [place] medicaid    PT Start Time 1533    PT Stop Time 1613    PT Time Calculation (min) 40 min    Activity Tolerance Patient tolerated treatment well    Behavior During Therapy [facility] for tasks assessed/performed           Past Medical History:  Diagnosis Date  . H/O malaria 2015   [place]    Past Surgical History:  Procedure Laterality Date  . NO PAST SURGERIES      There were no vitals filed for this visit.   Subjective Assessment - 03/26/20 1538    Subjective My week went well.  No pain today. It hurts when I change position at night, getting out of the car,    Patient is accompained by: Interpreter    Patient Stated Goals stop having pain    Currently in Pain? No/denies                             [facility] Adult PT Treatment/Exercise - 03/30/20 0001      Self-Care   Other Self-Care Comments  log roll and pencil skirt technique      Manual Therapy   Myofascial Release  abdominal; lumbar and gluteal MFR and STM                       PT Long Term Goals - 03/18/20 1544      PT LONG TERM GOAL #1   Title Pt will report at least 50% less pain    Status On-going      PT LONG TERM GOAL #2   Title Pt will be ind with advanced HEP to maintain core strength throughout pregnancy    Status On-going      PT LONG TERM GOAL #3   Title Pt will be able to be intimate with her husband without pain    Baseline a little better    Status On-going      PT LONG TERM  GOAL #4   Title ind with urge techniques    Baseline it is better using the urge techniques    Status Achieved                 Plan - 03/30/20 2019    Clinical Impression Statement Today's session focused on MFR to abdomen and lumbar and STM to gluteals bilateral. Pt has fascial restrictions that responded well to fascial release techniques and decreased pain after treatment.  She has been having less pain overall, but some days where there is constant pain.  Pain also during transitions.  pt was educated on log roll and pencil skirt technique which alieviated pain.  continue skilled PT to work on core strength    Personal Factors and Comorbidities Time since onset of injury/illness/exacerbation;Other;Comorbidity 2    Examination-Activity Limitations Toileting;Caring for Others;Sleep    PT Treatment/Interventions ADLs/Self Care Home Management;Biofeedback;Cryotherapy;Electrical Stimulation;Neuromuscular re-education;Therapeutic exercise;Therapeutic activities;Moist Heat;Passive  range of motion;Manual techniques;Taping;Patient/family education    PT Next Visit Plan core strength pelvic tilts    PT Home Exercise Plan Access Code: [email or web address]    Consulted and Agree with Plan of Care Patient           Patient will benefit from skilled therapeutic intervention in order to improve the following deficits and impairments:  Pain, Postural dysfunction, Decreased strength, Decreased coordination, Decreased range of motion  Visit Diagnosis: Cramp and spasm  Muscle weakness (generalized)  Abnormal posture     Problem List Patient Active Problem List   Diagnosis Date Noted  . Supervision of low-risk pregnancy 03/25/2020  . Pelvic pain 02/12/2019  . Language barrier affecting health care 05/20/2015    Junious Silk, PT 03/30/2020, 8:21 PM  [facility] [address], [address] [place], [place], [address] Phone: [phone]   Fax:   [phone]  Name: Sandra Sexton MRN: [id number] Date of Birth: [birth year]

## 2020-03-27 LAB — URINE CULTURE, OB REFLEX

## 2020-03-27 LAB — CULTURE, OB URINE

## 2020-04-02 ENCOUNTER — Ambulatory Visit: Payer: Medicaid Other | Attending: Women's Health

## 2020-04-03 ENCOUNTER — Other Ambulatory Visit: Payer: Self-pay

## 2020-04-03 ENCOUNTER — Ambulatory Visit: Payer: Medicaid Other | Attending: Women's Health | Admitting: Physical Therapy

## 2020-04-03 DIAGNOSIS — M6281 Muscle weakness (generalized): Secondary | ICD-10-CM | POA: Insufficient documentation

## 2020-04-03 DIAGNOSIS — R252 Cramp and spasm: Secondary | ICD-10-CM | POA: Insufficient documentation

## 2020-04-03 DIAGNOSIS — R293 Abnormal posture: Secondary | ICD-10-CM | POA: Diagnosis present

## 2020-04-03 NOTE — Therapy (Addendum)
[facility] [address], [place], [place], [address] Phone: [phone]   Fax:  [phone]  Physical Therapy Treatment  Patient Details  Name: Sandra Sexton MRN: [id number] Date of Birth: Sep 01, 1995 Referring Provider (PT): Vernice Jefferson, NP   Encounter Date: 04/03/2020   PT End of Session - 04/03/20 0922    Visit Number 5    Date for PT Re-Evaluation 05/15/20    Authorization Type medicaid - [place] medicaid    PT Start Time 0900   arrived late   PT Stop Time 0934    PT Time Calculation (min) 34 min    Activity Tolerance Patient tolerated treatment well    Behavior During Therapy [facility] for tasks assessed/performed           Past Medical History:  Diagnosis Date  . H/O malaria 2015   [place]    Past Surgical History:  Procedure Laterality Date  . NO PAST SURGERIES      There were no vitals filed for this visit.   Subjective Assessment - 04/03/20 0914    Subjective No pain currently, the exercises are not hurting.  I am not having pain with moving now.    Patient is accompained by: Interpreter    Patient Stated Goals stop having pain    Currently in Pain? No/denies                             [facility] Adult PT Treatment/Exercise - 04/03/20 0001      Lumbar Exercises: Aerobic   UBE (Upper Arm Bike) L1 3/3 fwd/back-PT present for status update      Lumbar Exercises: Standing   Shoulder Extension Limitations yellow band with LE marching core activation - cues to keep pelvis neutral - 20x    Other Standing Lumbar Exercises isometric shoulder flexion with yellow band - 10x      Lumbar Exercises: Sidelying   Clam Limitations clam in abduction 15x each side      Lumbar Exercises: Quadruped   Single Arm Raise 10 reps;Right;Left    Opposite Arm/Leg Raise Right arm/Left leg;Left arm/Right leg;10 reps                       PT Long Term Goals - 03/18/20  1544      PT LONG TERM GOAL #1   Title Pt will report at least 50% less pain    Status On-going      PT LONG TERM GOAL #2   Title Pt will be ind with advanced HEP to maintain core strength throughout pregnancy    Status On-going      PT LONG TERM GOAL #3   Title Pt will be able to be intimate with her husband without pain    Baseline a little better    Status On-going      PT LONG TERM GOAL #4   Title ind with urge techniques    Baseline it is better using the urge techniques    Status Achieved                 Plan - 04/03/20 1000    Clinical Impression Statement Today's session focused on core strength progression.  Pt is doing well and making good progress with no report of pain this week.  Pt able to progress strength and added to HEP today.  Pt did well  with exercises and no increased pain.  Needed cues to activate gluteals and keep pelvis neutral in qped and standing on single leg.    PT Treatment/Interventions ADLs/Self Care Home Management;Biofeedback;Cryotherapy;Electrical Stimulation;Neuromuscular re-education;Therapeutic exercise;Therapeutic activities;Moist Heat;Passive range of motion;Manual techniques;Taping;Patient/family education    PT Next Visit Plan core and hip strength progress as tolerated; STM as needed    PT Home Exercise Plan Access Code: [email or web address]    Consulted and Agree with Plan of Care Patient           Patient will benefit from skilled therapeutic intervention in order to improve the following deficits and impairments:  Pain, Postural dysfunction, Decreased strength, Decreased coordination, Decreased range of motion  Visit Diagnosis: Cramp and spasm  Muscle weakness (generalized)  Abnormal posture     Problem List Patient Active Problem List   Diagnosis Date Noted  . Supervision of low-risk pregnancy 03/25/2020  . Pelvic pain 02/12/2019  . Language barrier affecting health care 05/20/2015    Jule Ser, PT 04/03/2020, 10:08  AM  [facility] [address] [place], [place], [address] Phone: [phone]   Fax:  [phone]  Name: Sandra Sexton MRN: [id number] Date of Birth: 04/03/1996  PHYSICAL THERAPY DISCHARGE SUMMARY  Visits from Start of Care: 5  Current functional level related to goals / functional outcomes: See above goals, plus phone conversation pt expresses feeling like she can do exercises at home   Remaining deficits: See above   Education / Equipment: HEP  Plan: Patient agrees to discharge.  Patient goals were partially met. Patient is being discharged due to being pleased with the current functional level.  ?????    [employer], PT 04/10/20 8:50 AM

## 2020-04-09 ENCOUNTER — Ambulatory Visit: Payer: Medicaid Other | Admitting: Physical Therapy

## 2020-04-09 DIAGNOSIS — Z3490 Encounter for supervision of normal pregnancy, unspecified, unspecified trimester: Secondary | ICD-10-CM | POA: Diagnosis not present

## 2020-04-10 ENCOUNTER — Ambulatory Visit: Payer: Medicaid Other | Admitting: Physical Therapy

## 2020-04-22 ENCOUNTER — Other Ambulatory Visit: Payer: Self-pay

## 2020-04-22 ENCOUNTER — Ambulatory Visit (INDEPENDENT_AMBULATORY_CARE_PROVIDER_SITE_OTHER): Payer: Medicaid Other | Admitting: Women's Health

## 2020-04-22 VITALS — BP 108/58 | HR 65 | Wt 174.0 lb

## 2020-04-22 DIAGNOSIS — Z758 Other problems related to medical facilities and other health care: Secondary | ICD-10-CM

## 2020-04-22 DIAGNOSIS — R102 Pelvic and perineal pain unspecified side: Secondary | ICD-10-CM

## 2020-04-22 DIAGNOSIS — Z3492 Encounter for supervision of normal pregnancy, unspecified, second trimester: Secondary | ICD-10-CM

## 2020-04-22 DIAGNOSIS — Z789 Other specified health status: Secondary | ICD-10-CM

## 2020-04-22 NOTE — Patient Instructions (Addendum)
Maternity Assessment Unit (MAU)  The Maternity Assessment Unit (MAU) is located at the [facility] at [facility]. The address is: [address], [place], [place], [place] [address]. Please see map below for additional directions.    The Maternity Assessment Unit is designed to help you during your pregnancy, and for up to 6 weeks after delivery, with any pregnancy- or postpartum-related emergencies, if you think you are in labor, or if your water has broken. For example, if you experience nausea and vomiting, vaginal bleeding, severe abdominal or pelvic pain, elevated blood pressure or other problems related to your pregnancy or postpartum time, please come to the Maternity Assessment Unit for assistance.        Glucose Tolerance Test During Pregnancy Why am I having this test? The glucose tolerance test (GTT) is done to check how your body processes sugar (glucose). This is one of several tests used to diagnose diabetes that develops during pregnancy (gestational diabetes mellitus). Gestational diabetes is a temporary form of diabetes that some women develop during pregnancy. It usually occurs during the second trimester of pregnancy and goes away after delivery. Testing (screening) for gestational diabetes usually occurs between 24 and 28 weeks of pregnancy. You may have the GTT test after having a 1-hour glucose screening test if the results from that test indicate that you may have gestational diabetes. You may also have this test if:  You have a history of gestational diabetes.  You have a history of giving birth to very large babies or have experienced repeated fetal loss (stillbirth).  You have signs and symptoms of diabetes, such as: ? Changes in your vision. ? Tingling or numbness in your hands or feet. ? Changes in hunger, thirst, and urination that are not otherwise explained by your pregnancy. What is being tested? This test measures the  amount of glucose in your blood at different times during a period of 3 hours. This indicates how well your body is able to process glucose. What kind of sample is taken?  Blood samples are required for this test. They are usually collected by inserting a needle into a blood vessel. How do I prepare for this test?  For 3 days before your test, eat normally. Have plenty of carbohydrate-rich foods.  Follow instructions from your health care provider about: ? Eating or drinking restrictions on the day of the test. You may be asked to not eat or drink anything other than water (fast) starting 8-10 hours before the test. ? Changing or stopping your regular medicines. Some medicines may interfere with this test. Tell a health care provider about:  All medicines you are taking, including vitamins, herbs, eye drops, creams, and over-the-counter medicines.  Any blood disorders you have.  Any surgeries you have had.  Any medical conditions you have. What happens during the test? First, your blood glucose will be measured. This is referred to as your fasting blood glucose, since you fasted before the test. Then, you will drink a glucose solution that contains a certain amount of glucose. Your blood glucose will be measured again 1, 2, and 3 hours after drinking the solution. This test takes about 3 hours to complete. You will need to stay at the testing location during this time. During the testing period:  Do not eat or drink anything other than the glucose solution.  Do not exercise.  Do not use any products that contain nicotine or tobacco, such as cigarettes and e-cigarettes. If  you need help stopping, ask your health care provider. The testing procedure may vary among health care providers and hospitals. How are the results reported? Your results will be reported as milligrams of glucose per deciliter of blood (mg/dL) or millimoles per liter (mmol/L). Your health care provider will compare  your results to normal ranges that were established after testing a large group of people (reference ranges). Reference ranges may vary among labs and hospitals. For this test, common reference ranges are:  Fasting: less than 95-105 mg/dL (1.6-1.0 mmol/L).  1 hour after drinking glucose: less than 180-190 mg/dL (96.0-45.4 mmol/L).  2 hours after drinking glucose: less than 155-165 mg/dL (0.9-8.1 mmol/L).  3 hours after drinking glucose: 140-145 mg/dL (1.9-1.4 mmol/L). What do the results mean? Results within reference ranges are considered normal, meaning that your glucose levels are well-controlled. If two or more of your blood glucose levels are high, you may be diagnosed with gestational diabetes. If only one level is high, your health care provider may suggest repeat testing or other tests to confirm a diagnosis. Talk with your health care provider about what your results mean. Questions to ask your health care provider Ask your health care provider, or the department that is doing the test:  When will my results be ready?  How will I get my results?  What are my treatment options?  What other tests do I need?  What are my next steps? Summary  The glucose tolerance test (GTT) is one of several tests used to diagnose diabetes that develops during pregnancy (gestational diabetes mellitus). Gestational diabetes is a temporary form of diabetes that some women develop during pregnancy.  You may have the GTT test after having a 1-hour glucose screening test if the results from that test indicate that you may have gestational diabetes. You may also have this test if you have any symptoms or risk factors for gestational diabetes.  Talk with your health care provider about what your results mean. This information is not intended to replace advice given to you by your health care provider. Make sure you discuss any questions you have with your health care provider. Document Revised:  11/09/2018 Document Reviewed: 02/28/2017 Elsevier Patient Education  2020 Elsevier Inc.       AREA PEDIATRIC/FAMILY PRACTICE PHYSICIANS  [facility] [address] [place], [place] [address] Phone - [phone]   Fax - [phone]  JACK AMOS [address] [place], [place]  [address] Phone - [phone]   Fax - [phone]  [facility] [address] [place], [place]  [address] Phone - [phone]   Fax - [phone]  [facility] [address] [place], [place]  [address] Phone - [phone]   Fax - [phone]  [facility] [address] [place], [place]  [address] Phone - [phone]   Fax - [phone]  [facility] [address] [place], [place]  [address] Phone - [phone]   Fax - [phone]  [facility] [address] [place], [place]  [address] Phone - [phone]   Fax - [phone]  [facility] [address] [place], [address] [place], [place]  [address] Phone - [phone]   Fax - [phone]  [facility] [address] [place], [place]  [address] Phone - [phone]   Fax - [phone] [facility] [place] [address] [place], [place]  [address] Phone - [phone]   Fax - [phone]  [facility] [address] [place], [place]  [address] Phone - [phone]   Fax - [phone]  [facility] [address] [place], [place]  [address] Phone - [phone]   Fax - [phone]  [facility] [address] [place], [place]  [address] Phone - [phone]   Fax - [phone]  [facility] [address] [place], [place]  [address] Phone - [phone]  [facility] [address]  [place], [place]  [address] Phone - [phone]   Fax - [phone]  [facility] [address] [place], [place]  [address] Phone - [phone]   Fax - [phone]  [facility] [address] [place], [place]  [address] Phone - [phone]   Fax - [phone]  [facility] [address] [place], [place]  [address] Phone - [phone]   Fax - [phone]   [facility] [address] [place], [place]  [address] Phone - [phone]   Fax - [phone]  [facility] [address] [place], [place]  [address] Phone - [phone]   Fax - [phone]  DAVID RUBIN [address] [place], [place]  [address] Phone - [phone]   Fax - [phone]  [facility] [address] [place], [place]  [address] Phone - [phone]   Fax - [phone]  [facility] [address] [place], [place]  [address] Phone - [phone]   Fax - [phone] Gerarda Fraction [address] [place], [place]  [address] Phone - [phone]   Fax - [phone]  [facility] [address] [place], [place]  [address] Phone - [phone]   Fax - [phone]  [facility] [place] [address] [place], [place]  [address] Phone - [phone]   Fax - [phone]          Contraception Choices Contraception, also called birth control, refers to methods or devices that prevent pregnancy. Hormonal methods Contraceptive implant  A contraceptive implant is a thin, plastic tube that contains a hormone. It is inserted into the upper part of the arm. It can remain in place for up to 3 years. Progestin-only injections Progestin-only injections are injections of progestin, a synthetic form of the hormone progesterone. They are given every 3 months by a health care  provider. Birth control pills  Birth control pills are pills that contain hormones that prevent pregnancy. They must be taken once a day, preferably at the same time each day. Birth control patch  The birth control patch contains hormones that prevent pregnancy. It is placed on the skin and must be changed once a week for three weeks and removed on the fourth week. A prescription is needed to use this method of contraception. Vaginal ring  A vaginal ring contains hormones that prevent pregnancy. It is placed in the vagina for three weeks and removed on the fourth week. After that, the process is repeated with a new ring. A prescription is needed to use this method of contraception. Emergency contraceptive Emergency contraceptives prevent pregnancy after unprotected sex. They come in pill form and can be taken up to 5 days after sex. They work best the sooner they are taken after having sex. Most emergency contraceptives  are available without a prescription. This method should not be used as your only form of birth control. Barrier methods Female condom  A female condom is a thin sheath that is worn over the penis during sex. Condoms keep sperm from going inside a woman's body. They can be used with a spermicide to increase their effectiveness. They should be disposed after a single use. Female condom  A female condom is a soft, loose-fitting sheath that is put into the vagina before sex. The condom keeps sperm from going inside a woman's body. They should be disposed after a single use. Diaphragm  A diaphragm is a soft, dome-shaped barrier. It is inserted into the vagina before sex, along with a spermicide. The diaphragm blocks sperm from entering the uterus, and the spermicide kills sperm. A diaphragm should be left in the vagina for 6-8 hours after sex and removed within 24 hours. A diaphragm is prescribed and fitted by a health care provider. A diaphragm should be replaced every 1-2 years, after  giving birth, after gaining more than 15 lb (6.8 kg), and after pelvic surgery. Cervical cap  A cervical cap is a round, soft latex or plastic cup that fits over the cervix. It is inserted into the vagina before sex, along with spermicide. It blocks sperm from entering the uterus. The cap should be left in place for 6-8 hours after sex and removed within 48 hours. A cervical cap must be prescribed and fitted by a health care provider. It should be replaced every 2 years. Sponge  A sponge is a soft, circular piece of polyurethane foam with spermicide on it. The sponge helps block sperm from entering the uterus, and the spermicide kills sperm. To use it, you make it wet and then insert it into the vagina. It should be inserted before sex, left in for at least 6 hours after sex, and removed and thrown away within 30 hours. Spermicides Spermicides are chemicals that kill or block sperm from entering the cervix and uterus. They can come as a cream, jelly, suppository, foam, or tablet. A spermicide should be inserted into the vagina with an applicator at least 10-15 minutes before sex to allow time for it to work. The process must be repeated every time you have sex. Spermicides do not require a prescription. Intrauterine contraception Intrauterine device (IUD) An IUD is a T-shaped device that is put in a woman's uterus. There are two types:  Hormone IUD.This type contains progestin, a synthetic form of the hormone progesterone. This type can stay in place for 3-5 years.  Copper IUD.This type is wrapped in copper wire. It can stay in place for 10 years.  Permanent methods of contraception Female tubal ligation In this method, a woman's fallopian tubes are sealed, tied, or blocked during surgery to prevent eggs from traveling to the uterus. Hysteroscopic sterilization In this method, a small, flexible insert is placed into each fallopian tube. The inserts cause scar tissue to form in the fallopian  tubes and block them, so sperm cannot reach an egg. The procedure takes about 3 months to be effective. Another form of birth control must be used during those 3 months. Female sterilization This is a procedure to tie off the tubes that carry sperm (vasectomy). After the procedure, the man can still ejaculate fluid (semen). Natural planning methods Natural family planning In this method, a couple does not have sex on days when the woman could become pregnant. Calendar method This means keeping track of  the length of each menstrual cycle, identifying the days when pregnancy can happen, and not having sex on those days. Ovulation method In this method, a couple avoids sex during ovulation. Symptothermal method This method involves not having sex during ovulation. The woman typically checks for ovulation by watching changes in her temperature and in the consistency of cervical mucus. Post-ovulation method In this method, a couple waits to have sex until after ovulation. Summary  Contraception, also called birth control, means methods or devices that prevent pregnancy.  Hormonal methods of contraception include implants, injections, pills, patches, vaginal rings, and emergency contraceptives.  Barrier methods of contraception can include female condoms, female condoms, diaphragms, cervical caps, sponges, and spermicides.  There are two types of IUDs (intrauterine devices). An IUD can be put in a woman's uterus to prevent pregnancy for 3-5 years.  Permanent sterilization can be done through a procedure for males, females, or both.  Natural family planning methods involve not having sex on days when the woman could become pregnant. This information is not intended to replace advice given to you by your health care provider. Make sure you discuss any questions you have with your health care provider. Document Revised: 07/21/2017 Document Reviewed: 08/21/2016 Elsevier Patient Education  2020  Elsevier Inc.        Pregnancy and Influenza  Influenza, also called the flu, is an infection of the lungs and airways (respiratory tract). If you are pregnant, you are more likely to catch the flu. You are also more likely to have a more serious case of the flu. This is because pregnancy causes changes to your body's disease-fighting system (immune system), heart, and lungs. If you develop a bad case of the flu, especially with a high fever, this can cause problems for you and your developing baby. How do people get the flu? The flu is caused by a type of germ called a virus. It spreads when virus particles get passed from person to person by:  Being near a sick person who is coughing or sneezing.  Touching something that has the virus on it and then touching your mouth, nose, or face. The influenza virus is most common during the fall and winter. How can I protect myself against the flu?  Get a flu shot. The best way to prevent the flu is to get a flu shot before flu season starts. The flu shot is not dangerous for your developing baby. It may even help protect your baby from the flu for up to 6 months after birth.  Wash your hands often with soap and warm water. If soap and water are not available, use hand sanitizer.  Do not come in close contact with sick people.  Do not share food, drinks, or utensils with other people.  Avoid touching your eyes, nose, and mouth.  Clean frequently used surfaces at home, school, or work.  Practice healthy lifestyle habits, such as: ? Eating a healthy, balanced diet. ? Drinking plenty of fluids. ? Exercising regularly or as told by your health care provider. ? Sleeping 7-9 hours each night. ? Finding ways to manage stress. What should I do if I have flu symptoms?  If you have any symptoms of the flu, even after getting a flu shot, contact your health care provider right away.  To reduce fever, take over-the-counter acetaminophen as told  by your health care provider.  If you have the flu, you may get antiviral medicine to keep the flu from becoming severe  and to shorten how long it lasts.  Avoid spreading the flu to others: ? Stay home until you are well. ? Cover your nose and mouth when you cough or sneeze. ? Wash your hands often. Follow these instructions at home:  Take over-the-counter and prescription medicines only as told by your health care provider. Do not take any medicine, including cold or flu medicine, unless your health care provider tells you to do so.  If you were prescribed antiviral medicine, take it as told by your health care provider. Do not stop taking the antiviral medicine even if you start to feel better.  Eat a nutrient-rich diet that includes fresh fruits and vegetables, whole grains, lean protein, and low-fat dairy.  Drink enough fluid to keep your urine clear or pale yellow.  Get plenty of rest. Contact a health care provider if:  You have fever or chills.  You have a cough, sore throat, or stuffy nose.  You have worsening or unusual: ? Muscle aches. ? Headache. ? Tiredness. ? Loss of appetite.  You have vomiting or diarrhea. Get help right away if:  You have trouble breathing.  You have chest pain.  You have abdominal pain.  You begin to have labor pains.  You have a fever that does not go down 24 hours after you take medicine.  You do not feel your baby move.  You have diarrhea or vomiting that will not go away.  You have dizziness or confusion.  Your symptoms do not improve, even with treatment. Summary  If you are pregnant, you are more likely to catch the flu. You are also more likely to have a more serious case of the flu.  If you have flu-like symptoms, call your health care provider right away. If you develop a bad case of the flu, especially with a high fever, this can be dangerous for your developing baby.  The best way to prevent the flu is to get a flu  shot before flu season starts. The flu shot is not dangerous for your developing baby.  If you have the flu and were prescribed antiviral medicine, take it as told by your health care provider. This information is not intended to replace advice given to you by your health care provider. Make sure you discuss any questions you have with your health care provider. Document Revised: 11/10/2018 Document Reviewed: 09/14/2016 Elsevier Patient Education  2020 [employer]       Deciding about Circumcision in Baby Boys  (The Basics)  What is circumcision?  Circumcision is a surgery that removes the skin that covers the tip of the penis, called the "foreskin" Circumcision is usually done when a boy is between 231 and 2810 days old. In the [place], circumcision is common. In some other countries, fewer boys are circumcised. Circumcision is a common tradition in some religions.  Should I have my baby boy circumcised?  There is no easy answer. Circumcision has some benefits. But it also has risks. After talking with your doctor, you will have to decide for yourself what is right for your family.  What are the benefits of circumcision?  Circumcised boys seem to have slightly lower rates of: ?Urinary tract infections ?Swelling of the opening at the tip of the penis Circumcised men seem to have slightly lower rates of: ?Urinary tract infections ?Swelling of the opening at the tip of the penis ?Penis cancer ?HIV and other infections that you catch during sex ?Cervical cancer in the  women they have sex with Even so, in the [place], the risks of these problems are small - even in boys and men who have not been circumcised. Plus, boys and men who are not circumcised can reduce these extra risks by: ?Cleaning their penis well ?Using condoms during sex  What are the risks of circumcision?  Risks include: ?Bleeding or infection from the surgery ?Damage to or amputation of the  penis ?A chance that the doctor will cut off too much or not enough of the foreskin ?A chance that sex won't feel as good later in life Only about 1 out of every 200 circumcisions leads to problems. There is also a chance that your health insurance won't pay for circumcision.  How is circumcision done in baby boys?  First, the baby gets medicine for pain relief. This might be a cream on the skin or a shot into the base of the penis. Next, the doctor cleans the baby's penis well. Then he or she uses special tools to cut off the foreskin. Finally, the doctor wraps a bandage (called gauze) around the baby's penis. If you have your baby circumcised, his doctor or nurse will give you instructions on how to care for him after the surgery. It is important that you follow those instructions carefully.         Preterm Labor and Birth Information  The normal length of a pregnancy is 39-41 weeks. Preterm labor is when labor starts before 37 completed weeks of pregnancy. What are the risk factors for preterm labor? Preterm labor is more likely to occur in women who:  Have certain infections during pregnancy such as a bladder infection, sexually transmitted infection, or infection inside the uterus (chorioamnionitis).  Have a shorter-than-normal cervix.  Have gone into preterm labor before.  Have had surgery on their cervix.  Are younger than age 4 or older than age 44.  Are African American.  Are pregnant with twins or multiple babies (multiple gestation).  Take street drugs or smoke while pregnant.  Do not gain enough weight while pregnant.  Became pregnant shortly after having been pregnant. What are the symptoms of preterm labor? Symptoms of preterm labor include:  Cramps similar to those that can happen during a menstrual period. The cramps may happen with diarrhea.  Pain in the abdomen or lower back.  Regular uterine contractions that may feel like tightening of the  abdomen.  A feeling of increased pressure in the pelvis.  Increased watery or bloody mucus discharge from the vagina.  Water breaking (ruptured amniotic sac). Why is it important to recognize signs of preterm labor? It is important to recognize signs of preterm labor because babies who are born prematurely may not be fully developed. This can put them at an increased risk for:  Long-term (chronic) heart and lung problems.  Difficulty immediately after birth with regulating body systems, including blood sugar, body temperature, heart rate, and breathing rate.  Bleeding in the brain.  Cerebral palsy.  Learning difficulties.  Death. These risks are highest for babies who are born before 34 weeks of pregnancy. How is preterm labor treated? Treatment depends on the length of your pregnancy, your condition, and the health of your baby. It may involve:  Having a stitch (suture) placed in your cervix to prevent your cervix from opening too early (cerclage).  Taking or being given medicines, such as: ? Hormone medicines. These may be given early in pregnancy to help support the pregnancy. ? Medicine  to stop contractions. ? Medicines to help mature the baby's lungs. These may be prescribed if the risk of delivery is high. ? Medicines to prevent your baby from developing cerebral palsy. If the labor happens before 34 weeks of pregnancy, you may need to stay in the hospital. What should I do if I think I am in preterm labor? If you think that you are going into preterm labor, call your health care provider right away. How can I prevent preterm labor in future pregnancies? To increase your chance of having a full-term pregnancy:  Do not use any tobacco products, such as cigarettes, chewing tobacco, and e-cigarettes. If you need help quitting, ask your health care provider.  Do not use street drugs or medicines that have not been prescribed to you during your pregnancy.  Talk with your  health care provider before taking any herbal supplements, even if you have been taking them regularly.  Make sure you gain a healthy amount of weight during your pregnancy.  Watch for infection. If you think that you might have an infection, get it checked right away.  Make sure to tell your health care provider if you have gone into preterm labor before. This information is not intended to replace advice given to you by your health care provider. Make sure you discuss any questions you have with your health care provider. Document Revised: 11/10/2018 Document Reviewed: 12/10/2015 Elsevier Patient Education  2020 [employer].

## 2020-04-22 NOTE — Progress Notes (Signed)
Subjective:  Sandra Sexton is a 24 y.o. G4P3003 at [redacted]w[redacted]d being seen today for ongoing prenatal care.  She is currently monitored for the following issues for this low-risk pregnancy and has Language barrier affecting health care; Pelvic pain; and Supervision of low-risk pregnancy on their problem list.  Patient reports no complaints.  Contractions: Not present. Vag. Bleeding: None.  Movement: Present. Denies leaking of fluid.   The following portions of the patient's history were reviewed and updated as appropriate: allergies, current medications, past family history, past medical history, past social history, past surgical history and problem list. Problem list updated.  Objective:   Vitals:   04/22/20 1621  BP: (!) 108/58  Pulse: 65  Weight: 174 lb (78.9 kg)    Fetal Status: Fetal Heart Rate (bpm): 154   Movement: Present     General:  Alert, oriented and cooperative. Patient is in no acute distress.  Skin: Skin is warm and dry. No rash noted.   Cardiovascular: Normal heart rate noted  Respiratory: Normal respiratory effort, no problems with respiration noted  Abdomen: Soft, gravid, appropriate for gestational age. Pain/Pressure: Present     Pelvic: Vag. Bleeding: None     Cervical exam deferred        Extremities: Normal range of motion.  Edema: None  Mental Status: Normal mood and affect. Normal behavior. Normal judgment and thought content.   Urinalysis:      Assessment and Plan:  Pregnancy: G4P3003 at [redacted]w[redacted]d  1. Encounter for supervision of low-risk pregnancy in second trimester -discussed flu vaccine, pt declines -discussed contraception, pt undecided, information given, is considering IUD -discussed circumcision, info given -peds list given -needs anatomy scheduled ASAP -GTT/labs next visit  2. Language barrier affecting health care -Swahili interpreter used for entire visit  3. Pelvic pain -no concerns  Preterm labor symptoms and general obstetric  precautions including but not limited to vaginal bleeding, contractions, leaking of fluid and fetal movement were reviewed in detail with the patient. I discussed the assessment and treatment plan with the patient. The patient was provided an opportunity to ask questions and all were answered. The patient agreed with the plan and demonstrated an understanding of the instructions. The patient was advised to call back or seek an in-person office evaluation/go to MAU at [facility] for any urgent or concerning symptoms. Please refer to After Visit Summary for other counseling recommendations.  Return in about 5 weeks (around 05/27/2020) for in-person LOB/APP OK/GTT/labs, needs anatomy scan ASAP.   Ameia Morency, Odie Sera, NP

## 2020-05-09 ENCOUNTER — Ambulatory Visit: Payer: Medicaid Other

## 2020-05-20 ENCOUNTER — Ambulatory Visit: Payer: Medicaid Other | Attending: Women's Health

## 2020-05-20 ENCOUNTER — Other Ambulatory Visit: Payer: Self-pay

## 2020-05-20 DIAGNOSIS — Z3490 Encounter for supervision of normal pregnancy, unspecified, unspecified trimester: Secondary | ICD-10-CM | POA: Diagnosis not present

## 2020-05-21 ENCOUNTER — Other Ambulatory Visit: Payer: Self-pay | Admitting: *Deleted

## 2020-05-21 DIAGNOSIS — Z362 Encounter for other antenatal screening follow-up: Secondary | ICD-10-CM

## 2020-05-27 ENCOUNTER — Other Ambulatory Visit: Payer: Self-pay

## 2020-05-27 ENCOUNTER — Other Ambulatory Visit: Payer: Medicaid Other

## 2020-05-27 ENCOUNTER — Other Ambulatory Visit: Payer: Self-pay | Admitting: General Practice

## 2020-05-27 ENCOUNTER — Ambulatory Visit (INDEPENDENT_AMBULATORY_CARE_PROVIDER_SITE_OTHER): Payer: Medicaid Other | Admitting: Student

## 2020-05-27 VITALS — BP 116/72 | HR 96 | Wt 176.0 lb

## 2020-05-27 DIAGNOSIS — Z3492 Encounter for supervision of normal pregnancy, unspecified, second trimester: Secondary | ICD-10-CM

## 2020-05-27 DIAGNOSIS — Z3A27 27 weeks gestation of pregnancy: Secondary | ICD-10-CM

## 2020-05-27 DIAGNOSIS — Z3493 Encounter for supervision of normal pregnancy, unspecified, third trimester: Secondary | ICD-10-CM | POA: Diagnosis not present

## 2020-05-27 DIAGNOSIS — Z23 Encounter for immunization: Secondary | ICD-10-CM

## 2020-05-27 NOTE — Progress Notes (Signed)
   PRENATAL VISIT NOTE  Subjective:  Sandra Sexton is a 24 y.o. G4P3003 at [redacted]w[redacted]d being seen today for ongoing prenatal care.  She is currently monitored for the following issues for this low-risk pregnancy and has Language barrier affecting health care; Pelvic pain; and Supervision of low-risk pregnancy on their problem list.  Patient reports no complaints.  Contractions: Not present. Vag. Bleeding: None.  Movement: Present. Denies leaking of fluid.   The following portions of the patient's history were reviewed and updated as appropriate: allergies, current medications, past family history, past medical history, past social history, past surgical history and problem list.   Objective:   Vitals:   05/27/20 0834  BP: 116/72  Pulse: 96  Weight: 176 lb (79.8 kg)    Fetal Status: Fetal Heart Rate (bpm): 156 Fundal Height: 27 cm Movement: Present     General:  Alert, oriented and cooperative. Patient is in no acute distress.  Skin: Skin is warm and dry. No rash noted.   Cardiovascular: Normal heart rate noted  Respiratory: Normal respiratory effort, no problems with respiration noted  Abdomen: Soft, gravid, appropriate for gestational age.  Pain/Pressure: Present     Pelvic: Cervical exam deferred        Extremities: Normal range of motion.  Edema: None  Mental Status: Normal mood and affect. Normal behavior. Normal judgment and thought content.   Assessment and Plan:  Pregnancy: G4P3003 at [redacted]w[redacted]d 1. Encounter for supervision of low-risk pregnancy in second trimester -2 hour in process - Tdap vaccine greater than or equal to 7yo IM  Preterm labor symptoms and general obstetric precautions including but not limited to vaginal bleeding, contractions, leaking of fluid and fetal movement were reviewed in detail with the patient. Please refer to After Visit Summary for other counseling recommendations.   Return in about 3 weeks (around 06/17/2020), or LROB in person.  Future  Appointments  Date Time Provider Department Center  06/17/2020  3:45 PM [facility] US4 [facility]    Marylene Land, CNM

## 2020-05-29 LAB — CBC
Hematocrit: 34.5 % (ref 34.0–46.6)
Hemoglobin: 11.2 g/dL (ref 11.1–15.9)
MCH: 31 pg (ref 26.6–33.0)
MCHC: 32.5 g/dL (ref 31.5–35.7)
MCV: 96 fL (ref 79–97)
Platelets: 140 10*3/uL — ABNORMAL LOW (ref 150–450)
RBC: 3.61 x10E6/uL — ABNORMAL LOW (ref 3.77–5.28)
RDW: 11.8 % (ref 11.7–15.4)
WBC: 4.3 10*3/uL (ref 3.4–10.8)

## 2020-05-29 LAB — GLUCOSE TOLERANCE, 2 HOURS W/ 1HR: Glucose, Fasting: 81 mg/dL (ref 65–91)

## 2020-05-29 LAB — HIV ANTIBODY (ROUTINE TESTING W REFLEX): HIV Screen 4th Generation wRfx: NONREACTIVE

## 2020-05-29 LAB — RPR: RPR Ser Ql: NONREACTIVE

## 2020-06-10 ENCOUNTER — Other Ambulatory Visit: Payer: Self-pay

## 2020-06-10 DIAGNOSIS — Z3490 Encounter for supervision of normal pregnancy, unspecified, unspecified trimester: Secondary | ICD-10-CM

## 2020-06-17 ENCOUNTER — Other Ambulatory Visit: Payer: Self-pay

## 2020-06-17 ENCOUNTER — Ambulatory Visit: Payer: Medicaid Other | Attending: Maternal & Fetal Medicine

## 2020-06-17 ENCOUNTER — Other Ambulatory Visit: Payer: Medicaid Other

## 2020-06-17 ENCOUNTER — Encounter: Payer: Medicaid Other | Admitting: Advanced Practice Midwife

## 2020-06-17 DIAGNOSIS — Z362 Encounter for other antenatal screening follow-up: Secondary | ICD-10-CM | POA: Diagnosis present

## 2020-06-17 DIAGNOSIS — Z363 Encounter for antenatal screening for malformations: Secondary | ICD-10-CM

## 2020-06-17 DIAGNOSIS — Z3A30 30 weeks gestation of pregnancy: Secondary | ICD-10-CM | POA: Diagnosis not present

## 2020-06-20 ENCOUNTER — Other Ambulatory Visit: Payer: Medicaid Other

## 2020-06-20 ENCOUNTER — Other Ambulatory Visit: Payer: Self-pay

## 2020-06-20 DIAGNOSIS — Z3490 Encounter for supervision of normal pregnancy, unspecified, unspecified trimester: Secondary | ICD-10-CM | POA: Diagnosis not present

## 2020-06-21 LAB — GLUCOSE TOLERANCE, 2 HOURS W/ 1HR
Glucose, 1 hour: 91 mg/dL (ref 65–179)
Glucose, 2 hour: 66 mg/dL (ref 65–152)
Glucose, Fasting: 70 mg/dL (ref 65–91)

## 2020-06-24 ENCOUNTER — Ambulatory Visit (INDEPENDENT_AMBULATORY_CARE_PROVIDER_SITE_OTHER): Payer: Medicaid Other | Admitting: Certified Nurse Midwife

## 2020-06-24 ENCOUNTER — Other Ambulatory Visit: Payer: Self-pay

## 2020-06-24 VITALS — BP 104/69 | HR 89 | Wt 176.8 lb

## 2020-06-24 DIAGNOSIS — Z3493 Encounter for supervision of normal pregnancy, unspecified, third trimester: Secondary | ICD-10-CM

## 2020-06-24 DIAGNOSIS — Z3A31 31 weeks gestation of pregnancy: Secondary | ICD-10-CM

## 2020-06-24 MED ORDER — COMFORT FIT MATERNITY SUPP SM MISC: 1.0000 [IU] | Freq: Every day | 0 refills | Status: DC | PRN

## 2020-06-24 NOTE — Patient Instructions (Addendum)

## 2020-06-24 NOTE — Progress Notes (Signed)
   PRENATAL VISIT NOTE  Subjective:  Sandra Sexton is a 24 y.o. G4P3003 at [redacted]w[redacted]d being seen today for ongoing prenatal care.  She is currently monitored for the following issues for this low-risk pregnancy and has Language barrier affecting health care; Pelvic pain; and Supervision of low-risk pregnancy on their problem list.  Patient reports increased pelvic pressure and pain on the right side of her pelvis (near the round ligament).  Contractions: Not present. Vag. Bleeding: None.  Movement: Present. Denies leaking of fluid.   The following portions of the patient's history were reviewed and updated as appropriate: allergies, current medications, past family history, past medical history, past social history, past surgical history and problem list.   Objective:   Vitals:   06/24/20 1437  BP: 104/69  Pulse: 89  Weight: 176 lb 12.8 oz (80.2 kg)    Fetal Status: Fetal Heart Rate (bpm): 152 Fundal Height: 29 cm Movement: Present     General:  Alert, oriented and cooperative. Patient is in no acute distress.  Skin: Skin is warm and dry. No rash noted.   Cardiovascular: Normal heart rate noted  Respiratory: Normal respiratory effort, no problems with respiration noted  Abdomen: Soft, gravid, appropriate for gestational age.  Pain/Pressure: Present     Pelvic: Cervical exam deferred        Extremities: Normal range of motion.  Edema: None  Mental Status: Normal mood and affect. Normal behavior. Normal judgment and thought content.   Assessment and Plan:  Pregnancy: G4P3003 at [redacted]w[redacted]d 1. Encounter for supervision of low-risk pregnancy in third trimester - Pt doing well, just having increased pelvic pressure/pain. Leopold's show baby is breech and measuring small for dates. - [place] MFM OB FOLLOW UP; Future  2. [redacted] weeks gestation of pregnancy - Demonstrated stretches for managing pelvic pressure/pain and encouraging vertex position of baby, pt to do them twice daily. Verbalized  understanding. - Elastic Bandages & Supports (COMFORT FIT MATERNITY SUPP SM) MISC; 1 Units by Does not apply route daily as needed.  Dispense: 1 each; Refill: 0  Preterm labor symptoms and general obstetric precautions including but not limited to vaginal bleeding, contractions, leaking of fluid and fetal movement were reviewed in detail with the patient. Please refer to After Visit Summary for other counseling recommendations.   Return in about 2 weeks (around 07/08/2020) for LOB. U/S in one week.  Future Appointments  Date Time Provider Department Center  06/30/2020  3:00 PM [facility] NURSE [facility]  06/30/2020  3:15 PM [facility] US2 [facility]  07/09/2020  3:15 PM Nugent, Odie Sera, NP [facility]    Bernerd Limbo, CNM

## 2020-06-30 ENCOUNTER — Other Ambulatory Visit: Payer: Self-pay

## 2020-06-30 ENCOUNTER — Encounter: Payer: Self-pay | Admitting: *Deleted

## 2020-06-30 ENCOUNTER — Ambulatory Visit: Payer: Medicaid Other | Attending: Certified Nurse Midwife

## 2020-06-30 ENCOUNTER — Ambulatory Visit: Payer: Medicaid Other | Admitting: *Deleted

## 2020-06-30 VITALS — BP 96/64 | HR 82

## 2020-06-30 DIAGNOSIS — Z3A31 31 weeks gestation of pregnancy: Secondary | ICD-10-CM | POA: Diagnosis not present

## 2020-06-30 DIAGNOSIS — Z3A32 32 weeks gestation of pregnancy: Secondary | ICD-10-CM | POA: Diagnosis not present

## 2020-06-30 DIAGNOSIS — Z363 Encounter for antenatal screening for malformations: Secondary | ICD-10-CM | POA: Insufficient documentation

## 2020-06-30 DIAGNOSIS — R6252 Short stature (child): Secondary | ICD-10-CM | POA: Diagnosis not present

## 2020-06-30 DIAGNOSIS — O26843 Uterine size-date discrepancy, third trimester: Secondary | ICD-10-CM | POA: Diagnosis not present

## 2020-06-30 DIAGNOSIS — Z3687 Encounter for antenatal screening for uncertain dates: Secondary | ICD-10-CM | POA: Diagnosis not present

## 2020-07-09 ENCOUNTER — Other Ambulatory Visit: Payer: Self-pay

## 2020-07-09 ENCOUNTER — Ambulatory Visit (INDEPENDENT_AMBULATORY_CARE_PROVIDER_SITE_OTHER): Payer: Medicaid Other | Admitting: Women's Health

## 2020-07-09 VITALS — BP 108/59 | HR 92 | Wt 179.0 lb

## 2020-07-09 DIAGNOSIS — Z3493 Encounter for supervision of normal pregnancy, unspecified, third trimester: Secondary | ICD-10-CM

## 2020-07-09 DIAGNOSIS — Z789 Other specified health status: Secondary | ICD-10-CM

## 2020-07-09 DIAGNOSIS — K5900 Constipation, unspecified: Secondary | ICD-10-CM

## 2020-07-09 MED ORDER — DOCUSATE SODIUM 100 MG PO CAPS: 100.0000 mg | ORAL_CAPSULE | Freq: Two times a day (BID) | ORAL | 1 refills | Status: DC

## 2020-07-09 NOTE — Progress Notes (Signed)
Subjective:  Sandra Sexton is a 24 y.o. G4P3003 at [redacted]w[redacted]d being seen today for ongoing prenatal care.  She is currently monitored for the following issues for this low-risk pregnancy and has Language barrier affecting health care; Pelvic pain; and Supervision of low-risk pregnancy on their problem list.  Patient reports constipation.  Contractions: Not present. Vag. Bleeding: None.  Movement: Present. Denies leaking of fluid.   The following portions of the patient's history were reviewed and updated as appropriate: allergies, current medications, past family history, past medical history, past social history, past surgical history and problem list. Problem list updated.  Objective:   Vitals:   07/09/20 1546  BP: (!) 108/59  Pulse: 92  Weight: 179 lb (81.2 kg)    Fetal Status: Fetal Heart Rate (bpm): 141 Fundal Height: 32 cm Movement: Present     General:  Alert, oriented and cooperative. Patient is in no acute distress.  Skin: Skin is warm and dry. No rash noted.   Cardiovascular: Normal heart rate noted  Respiratory: Normal respiratory effort, no problems with respiration noted  Abdomen: Soft, gravid, appropriate for gestational age. Pain/Pressure: Present     Pelvic: Vag. Bleeding: None     Cervical exam deferred        Extremities: Normal range of motion.  Edema: None  Mental Status: Normal mood and affect. Normal behavior. Normal judgment and thought content.   Urinalysis:      Assessment and Plan:  Pregnancy: G4P3003 at [redacted]w[redacted]d  1. Encounter for supervision of low-risk pregnancy in third trimester -discussed GBS next visit  2. Language barrier affecting health care -Swahili interpreter used for entire visit  3. Constipation, unspecified constipation type - docusate sodium (COLACE) 100 MG capsule; Take 1 capsule (100 mg total) by mouth 2 (two) times daily.  Dispense: 60 capsule; Refill: 1   Preterm labor symptoms and general obstetric precautions including but not  limited to vaginal bleeding, contractions, leaking of fluid and fetal movement were reviewed in detail with the patient. I discussed the assessment and treatment plan with the patient. The patient was provided an opportunity to ask questions and all were answered. The patient agreed with the plan and demonstrated an understanding of the instructions. The patient was advised to call back or seek an in-person office evaluation/go to MAU at [facility] for any urgent or concerning symptoms. Please refer to After Visit Summary for other counseling recommendations.  Return in about 3 weeks (around 07/30/2020) for LOB/APP OK/GBS/Cultures.   Ulrick Methot, Odie Sera, NP

## 2020-07-09 NOTE — Patient Instructions (Addendum)
Maternity Assessment Unit (MAU)  The Maternity Assessment Unit (MAU) is located at the [facility]. The address is: [address], [place], [place], [place] [address]. Please see map below for additional directions.    The Maternity Assessment Unit is designed to help you during your pregnancy, and for up to 6 weeks after delivery, with any pregnancy- or postpartum-related emergencies, if you think you are in labor, or if your water has broken. For example, if you experience nausea and vomiting, vaginal bleeding, severe abdominal or pelvic pain, elevated blood pressure or other problems related to your pregnancy or postpartum time, please come to the Maternity Assessment Unit for assistance.        Constipation, Adult Constipation is when a person has fewer bowel movements in a week than normal, has difficulty having a bowel movement, or has stools that are dry, hard, or larger than normal. Constipation may be caused by an underlying condition. It may become worse with age if a person takes certain medicines and does not take in enough fluids. Follow these instructions at home: Eating and drinking   Eat foods that have a lot of fiber, such as fresh fruits and vegetables, whole grains, and beans.  Limit foods that are high in fat, low in fiber, or overly processed, such as french fries, hamburgers, cookies, candies, and soda.  Drink enough fluid to keep your urine clear or pale yellow. General instructions  Exercise regularly or as told by your health care provider.  Go to the restroom when you have the urge to go. Do not hold it in.  Take over-the-counter and prescription medicines only as told by your health care provider. These include any fiber supplements.  Practice pelvic floor retraining exercises, such as deep breathing while relaxing the lower abdomen and pelvic floor relaxation during bowel movements.  Watch your condition  for any changes.  Keep all follow-up visits as told by your health care provider. This is important. Contact a health care provider if:  You have pain that gets worse.  You have a fever.  You do not have a bowel movement after 4 days.  You vomit.  You are not hungry.  You lose weight.  You are bleeding from the anus.  You have thin, pencil-like stools. Get help right away if:  You have a fever and your symptoms suddenly get worse.  You leak stool or have blood in your stool.  Your abdomen is bloated.  You have severe pain in your abdomen.  You feel dizzy or you faint. This information is not intended to replace advice given to you by your health care provider. Make sure you discuss any questions you have with your health care provider. Document Revised: 07/01/2017 Document Reviewed: 01/07/2016 Elsevier Patient Education  2020 [employer].        Preterm Labor and Birth Information  The normal length of a pregnancy is 39-41 weeks. Preterm labor is when labor starts before 37 completed weeks of pregnancy. What are the risk factors for preterm labor? Preterm labor is more likely to occur in women who:  Have certain infections during pregnancy such as a bladder infection, sexually transmitted infection, or infection inside the uterus (chorioamnionitis).  Have a shorter-than-normal cervix.  Have gone into preterm labor before.  Have had surgery on their cervix.  Are younger than age 18 or older than age 60.  Are African American.  Are pregnant with twins or multiple babies (  multiple gestation).  Take street drugs or smoke while pregnant.  Do not gain enough weight while pregnant.  Became pregnant shortly after having been pregnant. What are the symptoms of preterm labor? Symptoms of preterm labor include:  Cramps similar to those that can happen during a menstrual period. The cramps may happen with diarrhea.  Pain in the abdomen or lower  back.  Regular uterine contractions that may feel like tightening of the abdomen.  A feeling of increased pressure in the pelvis.  Increased watery or bloody mucus discharge from the vagina.  Water breaking (ruptured amniotic sac). Why is it important to recognize signs of preterm labor? It is important to recognize signs of preterm labor because babies who are born prematurely may not be fully developed. This can put them at an increased risk for:  Long-term (chronic) heart and lung problems.  Difficulty immediately after birth with regulating body systems, including blood sugar, body temperature, heart rate, and breathing rate.  Bleeding in the brain.  Cerebral palsy.  Learning difficulties.  Death. These risks are highest for babies who are born before 34 weeks of pregnancy. How is preterm labor treated? Treatment depends on the length of your pregnancy, your condition, and the health of your baby. It may involve:  Having a stitch (suture) placed in your cervix to prevent your cervix from opening too early (cerclage).  Taking or being given medicines, such as: ? Hormone medicines. These may be given early in pregnancy to help support the pregnancy. ? Medicine to stop contractions. ? Medicines to help mature the baby's lungs. These may be prescribed if the risk of delivery is high. ? Medicines to prevent your baby from developing cerebral palsy. If the labor happens before 34 weeks of pregnancy, you may need to stay in the hospital. What should I do if I think I am in preterm labor? If you think that you are going into preterm labor, call your health care provider right away. How can I prevent preterm labor in future pregnancies? To increase your chance of having a full-term pregnancy:  Do not use any tobacco products, such as cigarettes, chewing tobacco, and e-cigarettes. If you need help quitting, ask your health care provider.  Do not use street drugs or medicines that  have not been prescribed to you during your pregnancy.  Talk with your health care provider before taking any herbal supplements, even if you have been taking them regularly.  Make sure you gain a healthy amount of weight during your pregnancy.  Watch for infection. If you think that you might have an infection, get it checked right away.  Make sure to tell your health care provider if you have gone into preterm labor before. This information is not intended to replace advice given to you by your health care provider. Make sure you discuss any questions you have with your health care provider. Document Revised: 11/10/2018 Document Reviewed: 12/10/2015 Elsevier Patient Education  2020 [employer].

## 2020-07-30 ENCOUNTER — Encounter: Payer: Medicaid Other | Admitting: Medical

## 2020-07-31 ENCOUNTER — Encounter: Payer: Medicaid Other | Admitting: Obstetrics and Gynecology

## 2020-08-02 NOTE — L&D Delivery Note (Signed)
OB/GYN Faculty Practice Delivery Note  Sandra Sexton is a 25 y.o. [phone] s/p vaginal delivery at [redacted]w[redacted]d. She was admitted for IOL secondary to decreased fetal movement.   ROM: 0h 38m with moderate meconium stained fluid GBS Status: negative Maximum Maternal Temperature: 98.98F  Labor Progress: Pt received cytotec x2 on admission. An epidural was placed prior to SROM for moderate meconium stained fluid. She then had an uncomplicated precipitous delivery as noted below.  Delivery Date/Time: 08/23/20 at 2324 Delivery: Called to room and patient was complete and pushing. Head delivered ROA. No nuchal cord present. Shoulder and body delivered in usual fashion. Infant with spontaneous cry, placed on mother's abdomen, dried and stimulated. Cord clamped x 2 after 1-minute delay, and cut by FOB under my direct supervision. Cord blood drawn. Placenta delivered spontaneously with gentle cord traction. Fundus firm with massage and Pitocin. Labia, perineum, vagina, and cervix were inspected, notable for first degree laceration s/p repair in standard fashion with 3-0 vicryl.   Placenta: 3-vessel cord, intact, sent to L&D Complications: none Lacerations: first degree laceration s/p repair in standard fashion with 3-0 vicryl EBL: 200 ml Analgesia: IV fentanyl, epidural  Infant: female  APGARs 8 & 9  weight per medical record  Lynnda Shields, MD OB/GYN Fellow, Faculty Practice

## 2020-08-14 ENCOUNTER — Ambulatory Visit (INDEPENDENT_AMBULATORY_CARE_PROVIDER_SITE_OTHER): Payer: Medicaid Other | Admitting: Obstetrics & Gynecology

## 2020-08-14 ENCOUNTER — Other Ambulatory Visit: Payer: Self-pay

## 2020-08-14 ENCOUNTER — Other Ambulatory Visit (HOSPITAL_COMMUNITY)
Admission: RE | Admit: 2020-08-14 | Discharge: 2020-08-14 | Disposition: A | Discharge status: Home / Self Care | Payer: Medicaid Other | Source: Ambulatory Visit | Attending: Obstetrics and Gynecology | Admitting: Obstetrics and Gynecology

## 2020-08-14 ENCOUNTER — Encounter: Payer: Medicaid Other | Admitting: Obstetrics and Gynecology

## 2020-08-14 VITALS — BP 120/74 | HR 114 | Wt 183.5 lb

## 2020-08-14 DIAGNOSIS — Z3493 Encounter for supervision of normal pregnancy, unspecified, third trimester: Secondary | ICD-10-CM | POA: Insufficient documentation

## 2020-08-14 DIAGNOSIS — Z789 Other specified health status: Secondary | ICD-10-CM

## 2020-08-14 NOTE — Progress Notes (Signed)
   PRENATAL VISIT NOTE  Subjective:  Sandra Sexton is a 25 y.o. G4P3003 at [redacted]w[redacted]d being seen today for ongoing prenatal care.  She is currently monitored for the following issues for this low-risk pregnancy and has Language barrier affecting health care; Pelvic pain; and Supervision of low-risk pregnancy on their problem list.  Patient reports occasional contractions.  Contractions: Irritability. Vag. Bleeding: None.  Movement: Present. Denies leaking of fluid.   The following portions of the patient's history were reviewed and updated as appropriate: allergies, current medications, past family history, past medical history, past social history, past surgical history and problem list.   Objective:   Vitals:   08/14/20 1605  BP: 120/74  Pulse: (!) 114  Weight: 183 lb 8 oz (83.2 kg)    Fetal Status: Fetal Heart Rate (bpm): 174   Movement: Present  Presentation: Vertex  General:  Alert, oriented and cooperative. Patient is in no acute distress.  Skin: Skin is warm and dry. No rash noted.   Cardiovascular: Normal heart rate noted  Respiratory: Normal respiratory effort, no problems with respiration noted  Abdomen: Soft, gravid, appropriate for gestational age.  Pain/Pressure: Present     Pelvic: Cervical exam performed in the presence of a chaperone Dilation: Fingertip Effacement (%): 40 Station: Ballotable  Extremities: Normal range of motion.  Edema: None  Mental Status: Normal mood and affect. Normal behavior. Normal judgment and thought content.   Assessment and Plan:  Pregnancy: G4P3003 at [redacted]w[redacted]d 1. Encounter for supervision of low-risk pregnancy in third trimester Routine testing - GC/Chlamydia probe amp ()not at [facility] - Culture, beta strep (group b only)  2. Language barrier affecting health care Swahili interpreter   Term labor symptoms and general obstetric precautions including but not limited to vaginal bleeding, contractions, leaking of fluid and fetal  movement were reviewed in detail with the patient. Please refer to After Visit Summary for other counseling recommendations.   Return in about 1 week (around 08/21/2020).  No future appointments.  Scheryl Darter, MD

## 2020-08-14 NOTE — Patient Instructions (Signed)

## 2020-08-14 NOTE — Progress Notes (Unsigned)
[facility] # [phone]

## 2020-08-15 LAB — GC/CHLAMYDIA PROBE AMP (~~LOC~~) NOT AT ARMC
Chlamydia: NEGATIVE
Comment: NEGATIVE
Comment: NORMAL
Neisseria Gonorrhea: NEGATIVE

## 2020-08-18 LAB — CULTURE, BETA STREP (GROUP B ONLY): Strep Gp B Culture: NEGATIVE

## 2020-08-21 ENCOUNTER — Other Ambulatory Visit: Payer: Self-pay

## 2020-08-21 ENCOUNTER — Other Ambulatory Visit: Payer: Self-pay | Admitting: Obstetrics & Gynecology

## 2020-08-21 ENCOUNTER — Ambulatory Visit (INDEPENDENT_AMBULATORY_CARE_PROVIDER_SITE_OTHER): Payer: Medicaid Other | Admitting: Obstetrics & Gynecology

## 2020-08-21 VITALS — BP 115/67 | HR 92 | Wt 185.4 lb

## 2020-08-21 DIAGNOSIS — Z3493 Encounter for supervision of normal pregnancy, unspecified, third trimester: Secondary | ICD-10-CM

## 2020-08-21 NOTE — Progress Notes (Unsigned)
   PRENATAL VISIT NOTE  Subjective:  Sandra Sexton is a 25 y.o. G4P3003 at [redacted]w[redacted]d being seen today for ongoing prenatal care.  She is currently monitored for the following issues for this low-risk pregnancy and has Language barrier affecting health care; Pelvic pain; and Supervision of low-risk pregnancy on their problem list.  Patient reports no complaints.   .  .   . Denies leaking of fluid.   The following portions of the patient's history were reviewed and updated as appropriate: allergies, current medications, past family history, past medical history, past social history, past surgical history and problem list.   Objective:  There were no vitals filed for this visit.  Fetal Status:           General:  Alert, oriented and cooperative. Patient is in no acute distress.  Skin: Skin is warm and dry. No rash noted.   Cardiovascular: Normal heart rate noted  Respiratory: Normal respiratory effort, no problems with respiration noted  Abdomen: Soft, gravid, appropriate for gestational age.        Pelvic: Cervical exam deferred        Extremities: Normal range of motion.     Mental Status: Normal mood and affect. Normal behavior. Normal judgment and thought content.   Assessment and Plan:  Pregnancy: [id number] at [redacted]w[redacted]d There are no diagnoses linked to this encounter. Term labor symptoms and general obstetric precautions including but not limited to vaginal bleeding, contractions, leaking of fluid and fetal movement were reviewed in detail with the patient. Please refer to After Visit Summary for other counseling recommendations.  Fetal monitoring next week, IOL 41 weeks   Future Appointments  Date Time Provider Department Center  08/26/2020  9:15 AM Venora Maples, MD [facility]    Scheryl Darter, MD

## 2020-08-21 NOTE — Progress Notes (Signed)
IOL form faxed to L&D, received successful transmission.  Judeth Cornfield, RN

## 2020-08-21 NOTE — Progress Notes (Signed)
Sandra Sexton as in-person interpreter

## 2020-08-21 NOTE — Progress Notes (Addendum)
  History:  [name] is a 25 y.o. [phone] who presents to clinic today for ongoing prenatal care.  She is currently monitored for the following issues for this low-risk pregnancy and has Language barrier affecting health care; Pelvic pain; and Supervision of low-risk pregnancy on their problem list.  Patient denies contractions, vaginal bleeding, or LOF. Reports feeling baby move regularly. No concerns at today's visit.   The following portions of the patient's history were reviewed and updated as appropriate: allergies, current medications, family history, past medical history, social history, past surgical history and problem list.  Review of Systems:  Review of Systems  Eyes: Negative for blurred vision.  Respiratory: Negative for shortness of breath.   Cardiovascular: Negative for chest pain.  Genitourinary: Negative.        No Vaginal bleeding or LOF  Neurological: Negative for headaches.     Objective:  Physical Exam BP 115/67   Pulse 92   Wt 185 lb 6.4 oz (84.1 kg)   LMP  (LMP Unknown)   BMI 30.85 kg/m  Physical Exam Constitutional:      General: She is not in acute distress.    Appearance: Normal appearance.  HENT:     Head: Normocephalic and atraumatic.  Cardiovascular:     Rate and Rhythm: Normal rate.  Pulmonary:     Effort: Pulmonary effort is normal.  Abdominal:     Comments: Soft, gravid, appropriate for gestational age  Genitourinary:    Comments: Declined Skin:    General: Skin is warm and dry.  Neurological:     General: No focal deficit present.     Mental Status: She is alert and oriented to person, place, and time.  Psychiatric:        Mood and Affect: Mood normal.        Behavior: Behavior normal.    Labs and Imaging No results found for this or any previous visit (from the past 24 hour(s)).  No results found.   Assessment & Plan:  Pregnancy: G4P3003 at [redacted]w[redacted]d  1. Encounter for supervision of low-risk pregnancy in third  trimester - GC/Chlamydia and beta strep culture negative - Will schedule induction for 08/30/20  2. Language barrier affecting health care Swahili interpreter   Term labor symptoms and general obstetric precautions including but not limited to vaginal bleeding, contractions, leaking of fluid and fetal movement were reviewed in detail with the patient. Please refer to After Visit Summary for other counseling recommendations.   Return in about 5 days.  Melvyn Neth, Medical Student 08/21/2020 2:02 PM   Attestation of Attending Supervision of Medical Student: Evaluation and management procedures were performed by the medical student under my supervision and collaboration.  I have reviewed the student's note and chart, and I agree with the management and plan.  Scheryl Darter, MD, FACOG Attending Obstetrician & Gynecologist Faculty Practice, [facility]

## 2020-08-21 NOTE — Patient Instructions (Signed)

## 2020-08-23 ENCOUNTER — Inpatient Hospital Stay (HOSPITAL_COMMUNITY)
Admission: AD | Admit: 2020-08-23 | Discharge: 2020-08-25 | DRG: 805 | Disposition: A | Discharge status: Home / Self Care | Payer: Medicaid Other | Attending: Obstetrics and Gynecology | Admitting: Obstetrics and Gynecology

## 2020-08-23 ENCOUNTER — Inpatient Hospital Stay (HOSPITAL_COMMUNITY)
Admission: AD | Admit: 2020-08-23 | Payer: Medicaid Other | Source: Home / Self Care | Admitting: Obstetrics and Gynecology

## 2020-08-23 ENCOUNTER — Inpatient Hospital Stay (HOSPITAL_COMMUNITY): Payer: Medicaid Other | Admitting: Anesthesiology

## 2020-08-23 ENCOUNTER — Other Ambulatory Visit: Payer: Self-pay

## 2020-08-23 ENCOUNTER — Encounter (HOSPITAL_COMMUNITY): Payer: Self-pay | Admitting: Obstetrics and Gynecology

## 2020-08-23 DIAGNOSIS — O368130 Decreased fetal movements, third trimester, not applicable or unspecified: Secondary | ICD-10-CM | POA: Diagnosis not present

## 2020-08-23 DIAGNOSIS — Z603 Acculturation difficulty: Secondary | ICD-10-CM | POA: Diagnosis not present

## 2020-08-23 DIAGNOSIS — M549 Dorsalgia, unspecified: Secondary | ICD-10-CM | POA: Diagnosis not present

## 2020-08-23 DIAGNOSIS — O4202 Full-term premature rupture of membranes, onset of labor within 24 hours of rupture: Secondary | ICD-10-CM | POA: Diagnosis not present

## 2020-08-23 DIAGNOSIS — D649 Anemia, unspecified: Secondary | ICD-10-CM | POA: Diagnosis not present

## 2020-08-23 DIAGNOSIS — O9852 Other viral diseases complicating childbirth: Secondary | ICD-10-CM | POA: Diagnosis not present

## 2020-08-23 DIAGNOSIS — Z3A40 40 weeks gestation of pregnancy: Secondary | ICD-10-CM | POA: Diagnosis not present

## 2020-08-23 DIAGNOSIS — O9902 Anemia complicating childbirth: Secondary | ICD-10-CM | POA: Diagnosis not present

## 2020-08-23 DIAGNOSIS — O99893 Other specified diseases and conditions complicating puerperium: Secondary | ICD-10-CM | POA: Diagnosis not present

## 2020-08-23 DIAGNOSIS — U071 COVID-19: Secondary | ICD-10-CM

## 2020-08-23 DIAGNOSIS — R109 Unspecified abdominal pain: Secondary | ICD-10-CM | POA: Diagnosis not present

## 2020-08-23 DIAGNOSIS — Z789 Other specified health status: Secondary | ICD-10-CM | POA: Diagnosis present

## 2020-08-23 DIAGNOSIS — Z8759 Personal history of other complications of pregnancy, childbirth and the puerperium: Secondary | ICD-10-CM

## 2020-08-23 LAB — CBC
HCT: 34.8 % — ABNORMAL LOW (ref 36.0–46.0)
Hemoglobin: 10.9 g/dL — ABNORMAL LOW (ref 12.0–15.0)
MCH: 29 pg (ref 26.0–34.0)
MCHC: 31.3 g/dL (ref 30.0–36.0)
MCV: 92.6 fL (ref 80.0–100.0)
Platelets: 130 10*3/uL — ABNORMAL LOW (ref 150–400)
RBC: 3.76 MIL/uL — ABNORMAL LOW (ref 3.87–5.11)
RDW: 13.7 % (ref 11.5–15.5)
WBC: 4.4 10*3/uL (ref 4.0–10.5)
nRBC: 0 % (ref 0.0–0.2)

## 2020-08-23 LAB — TYPE AND SCREEN
ABO/RH(D): O POS
Antibody Screen: NEGATIVE

## 2020-08-23 LAB — SARS CORONAVIRUS 2 BY RT PCR (HOSPITAL ORDER, PERFORMED IN ~~LOC~~ HOSPITAL LAB): SARS Coronavirus 2: POSITIVE — AB

## 2020-08-23 MED ORDER — OXYTOCIN-SODIUM CHLORIDE 30-0.9 UT/500ML-% IV SOLN
2.5000 [IU]/h | INTRAVENOUS | Status: DC
  Filled 2020-08-23 (×2): qty 500

## 2020-08-23 MED ORDER — MISOPROSTOL 50MCG HALF TABLET
50.0000 ug | ORAL_TABLET | ORAL | Status: DC | PRN
  Administered 2020-08-23 (×2): 50 ug via BUCCAL
  Filled 2020-08-23 (×2): qty 1

## 2020-08-23 MED ORDER — OXYTOCIN BOLUS FROM INFUSION: 333.0000 mL | Freq: Once | INTRAVENOUS | Status: DC

## 2020-08-23 MED ORDER — TERBUTALINE SULFATE 1 MG/ML IJ SOLN: 0.2500 mg | Freq: Once | INTRAMUSCULAR | Status: DC | PRN

## 2020-08-23 MED ORDER — LACTATED RINGERS IV SOLN
INTRAVENOUS | Status: DC
  Administered 2020-08-23: 125 mL/h via INTRAVENOUS

## 2020-08-23 MED ORDER — LACTATED RINGERS IV SOLN: 500.0000 mL | Freq: Once | INTRAVENOUS | Status: DC

## 2020-08-23 MED ORDER — ONDANSETRON HCL 4 MG/2ML IJ SOLN: 4.0000 mg | Freq: Four times a day (QID) | INTRAMUSCULAR | Status: DC | PRN

## 2020-08-23 MED ORDER — PHENYLEPHRINE 40 MCG/ML (10ML) SYRINGE FOR IV PUSH (FOR BLOOD PRESSURE SUPPORT): 80.0000 ug | PREFILLED_SYRINGE | INTRAVENOUS | Status: DC | PRN

## 2020-08-23 MED ORDER — SODIUM CHLORIDE (PF) 0.9 % IJ SOLN
INTRAMUSCULAR | Status: DC | PRN
  Administered 2020-08-23: 12 mL/h via EPIDURAL

## 2020-08-23 MED ORDER — OXYCODONE-ACETAMINOPHEN 5-325 MG PO TABS
2.0000 | ORAL_TABLET | ORAL | Status: DC | PRN
Start: 2020-08-23 — End: 2020-08-24

## 2020-08-23 MED ORDER — SOD CITRATE-CITRIC ACID 500-334 MG/5ML PO SOLN: 30.0000 mL | ORAL | Status: DC | PRN

## 2020-08-23 MED ORDER — EPHEDRINE 5 MG/ML INJ: 10.0000 mg | INTRAVENOUS | Status: DC | PRN

## 2020-08-23 MED ORDER — LACTATED RINGERS IV SOLN: 500.0000 mL | INTRAVENOUS | Status: DC | PRN

## 2020-08-23 MED ORDER — LIDOCAINE HCL (PF) 1 % IJ SOLN: 30.0000 mL | INTRAMUSCULAR | Status: DC | PRN

## 2020-08-23 MED ORDER — ACETAMINOPHEN 325 MG PO TABS: 650.0000 mg | ORAL_TABLET | ORAL | Status: DC | PRN

## 2020-08-23 MED ORDER — FENTANYL-BUPIVACAINE-NACL 0.5-0.125-0.9 MG/250ML-% EP SOLN
EPIDURAL | Status: AC
  Filled 2020-08-23: qty 250

## 2020-08-23 MED ORDER — FENTANYL-BUPIVACAINE-NACL 0.5-0.125-0.9 MG/250ML-% EP SOLN: 12.0000 mL/h | EPIDURAL | Status: DC | PRN

## 2020-08-23 MED ORDER — FENTANYL CITRATE (PF) 100 MCG/2ML IJ SOLN
100.0000 ug | INTRAMUSCULAR | Status: DC | PRN
  Administered 2020-08-23: 100 ug via INTRAVENOUS
  Filled 2020-08-23: qty 2

## 2020-08-23 MED ORDER — LIDOCAINE HCL (PF) 1 % IJ SOLN
INTRAMUSCULAR | Status: DC | PRN
  Administered 2020-08-23: 3 mL via EPIDURAL
  Administered 2020-08-23: 5 mL via EPIDURAL
  Administered 2020-08-23: 2 mL via EPIDURAL

## 2020-08-23 MED ORDER — OXYCODONE-ACETAMINOPHEN 5-325 MG PO TABS: 1.0000 | ORAL_TABLET | ORAL | Status: DC | PRN

## 2020-08-23 MED ORDER — DIPHENHYDRAMINE HCL 50 MG/ML IJ SOLN: 12.5000 mg | INTRAMUSCULAR | Status: DC | PRN

## 2020-08-23 NOTE — Anesthesia Preprocedure Evaluation (Addendum)
Anesthesia Evaluation  Patient identified by MRN, date of birth, ID band Patient awake    Reviewed: Allergy & Precautions, NPO status , Patient's Chart, lab work & pertinent test results  Airway Mallampati: II  TM Distance: >3 FB Neck ROM: Full    Dental  (+) Teeth Intact, Dental Advisory Given   Pulmonary neg pulmonary ROS,    Pulmonary exam normal breath sounds clear to auscultation       Cardiovascular negative cardio ROS Normal cardiovascular exam Rhythm:Regular Rate:Normal     Neuro/Psych negative neurological ROS     GI/Hepatic negative GI ROS, Neg liver ROS,   Endo/Other  negative endocrine ROS  Renal/GU negative Renal ROS     Musculoskeletal negative musculoskeletal ROS (+)   Abdominal   Peds  Hematology  (+) Blood dyscrasia (Plt 130k), anemia ,   Anesthesia Other Findings Day of surgery medications reviewed with the patient.  Reproductive/Obstetrics (+) Pregnancy                            Anesthesia Physical Anesthesia Plan  ASA: II  Anesthesia Plan: Epidural   Post-op Pain Management:    Induction:   PONV Risk Score and Plan: 2 and Treatment may vary due to age or medical condition  Airway Management Planned: Natural Airway  Additional Equipment:   Intra-op Plan:   Post-operative Plan:   Informed Consent: I have reviewed the patients History and Physical, chart, labs and discussed the procedure including the risks, benefits and alternatives for the proposed anesthesia with the patient or authorized representative who has indicated his/her understanding and acceptance.     Dental advisory given  Plan Discussed with:   Anesthesia Plan Comments: (Patient identified. Risks/Benefits/Options discussed with patient including but not limited to bleeding, infection, nerve damage, paralysis, failed block, incomplete pain control, headache, blood pressure changes,  nausea, vomiting, reactions to medication both or allergic, itching and postpartum back pain. Confirmed with bedside nurse the patient's most recent platelet count. Confirmed with patient that they are not currently taking any anticoagulation, have any bleeding history or any family history of bleeding disorders. Patient expressed understanding and wished to proceed. All questions were answered. )        Anesthesia Quick Evaluation

## 2020-08-23 NOTE — MAU Note (Signed)
Pt reports to mau with c/o DFM since yesterday.  FHR 150 in triage.  Pt also reports one episode of LOF last night.  Pt denies vag bleeding or CTX at this time.  Pt reports lower right abd pain that is constant and shar.

## 2020-08-23 NOTE — Progress Notes (Signed)
Labor Progress Note Sandra Sexton is a 25 y.o. G4P3003 at [redacted]w[redacted]d presented for IOL for decreased fetal movement at term  S:  Patient rates contractions a 5/10  O:  BP 113/71   Pulse 80   Temp 98.1 F (36.7 C) (Oral)   Resp 16   LMP  (LMP Unknown)   SpO2 99%   Fetal Tracing:  Baseline: 135 Variability: moderate Accels: 15x15 Decels: none  Toco: 5-9   CVE: Dilation: Fingertip Effacement (%): Thick Station: Ballotable Presentation: Vertex Exam by:: Sharmon Revere   A&P: 25 y.o. F1Q1976 [redacted]w[redacted]d IOL decreased fetal movement at term #Labor: Progressing well. Continue cytotec #Pain: per patient request #FWB: Cat 1 #GBS negative  Rolm Bookbinder, CNM 6:09 PM

## 2020-08-23 NOTE — Anesthesia Procedure Notes (Signed)
Epidural Patient location during procedure: OB Start time: 08/23/2020 10:43 PM End time: 08/23/2020 10:50 PM  Staffing Anesthesiologist: Cecile Hearing, MD Performed: anesthesiologist   Preanesthetic Checklist Completed: patient identified, IV checked, risks and benefits discussed, monitors and equipment checked, pre-op evaluation and timeout performed  Epidural Patient position: sitting Prep: DuraPrep Patient monitoring: blood pressure and continuous pulse ox Approach: midline Location: L3-L4 Injection technique: LOR air  Needle:  Needle type: Tuohy  Needle gauge: 17 G Needle length: 9 cm Needle insertion depth: 6 cm Catheter size: 19 Gauge Catheter at skin depth: 11 cm Test dose: negative and Other (1% Lidocaine)  Additional Notes Patient identified.  Risk benefits discussed including failed block, incomplete pain control, headache, nerve damage, paralysis, blood pressure changes, nausea, vomiting, reactions to medication both toxic or allergic, and postpartum back pain.  Patient expressed understanding and wished to proceed.  All questions were answered.  Sterile technique used throughout procedure and epidural site dressed with sterile barrier dressing. No paresthesia or other complications noted. The patient did not experience any signs of intravascular injection such as tinnitus or metallic taste in mouth nor signs of intrathecal spread such as rapid motor block. Please see nursing notes for vital signs. Reason for block:procedure for pain

## 2020-08-23 NOTE — MAU Note (Signed)
Vertex presentation confirmed by BSUS by np nugent

## 2020-08-23 NOTE — MAU Provider Note (Signed)
Pt informed that the ultrasound is considered a limited OB ultrasound and is not intended to be a complete ultrasound exam.  Patient also informed that the ultrasound is not being completed with the intent of assessing for fetal or placental anomalies or any pelvic abnormalities. Explained that the purpose of today's ultrasound is to assess for  presentation (VTX).  Patient acknowledges the purpose of the exam and the limitations of the study.    Marylen Ponto, NP  12:06 PM 08/23/2020

## 2020-08-23 NOTE — H&P (Signed)
OBSTETRIC ADMISSION HISTORY AND PHYSICAL  Sandra Sexton is a 25 y.o. female [phone] with IUP at 3w0dby LMP presenting for IOL for decreased fetal movement at term. She reports +FMs, No LOF, no VB, no blurry vision, headaches or peripheral edema, and RUQ pain.  She plans on breast feeding. She is unsure what she desires for birth control. [facility]  Dating: By LMP --->  Estimated Date of Delivery: 08/23/20  Prenatal History/Complications: n/a  Past Medical History: Past Medical History:  Diagnosis Date  . H/O malaria 2015   [place]    Past Surgical History: Past Surgical History:  Procedure Laterality Date  . NO PAST SURGERIES      Obstetrical History: OB History    Gravida  4   Para  3   Term  3   Preterm  0   AB  0   Living  3     SAB  0   IAB  0   Ectopic  0   Multiple  0   Live Births  3           Social History Social History   Socioeconomic History  . Marital status: Married    Spouse name: Not on file  . Number of children: Not on file  . Years of education: Not on file  . Highest education level: Not on file  Occupational History  . Not on file  Tobacco Use  . Smoking status: Never Smoker  . Smokeless tobacco: Never Used  Vaping Use  . Vaping Use: Never used  Substance and Sexual Activity  . Alcohol use: No  . Drug use: No  . Sexual activity: Yes  Other Topics Concern  . Not on file  Social History Narrative  . Not on file   Social Determinants of Health   Financial Resource Strain: Not on file  Food Insecurity: No Food Insecurity  . Worried About RCharity fundraiserin the Last Year: Never true  . Ran Out of Food in the Last Year: Never true  Transportation Needs: No Transportation Needs  . Lack of Transportation (Medical): No  . Lack of Transportation (Non-Medical): No  Physical Activity: Not on file  Stress: Not on file  Social Connections: Not on file    Family  History: History reviewed. No pertinent family history.  Allergies: No Known Allergies  Medications Prior to Admission  Medication Sig Dispense Refill Last Dose  . Blood Pressure Monitoring (BLOOD PRESSURE KIT) DEVI 1 Device by Does not apply route daily. 1 each 0   . docusate sodium (COLACE) 100 MG capsule Take 1 capsule (100 mg total) by mouth 2 (two) times daily. (Patient not taking: Reported on 08/21/2020) 60 capsule 1   . Elastic Bandages & Supports (COMFORT FIT MATERNITY SUPP SM) MISC 1 Units by Does not apply route daily as needed. (Patient not taking: Reported on 08/21/2020) 1 each 0   . Prenatal Vit-Fe Fumarate-FA (PRENATAL PO) Take by mouth.        Review of Systems   All systems reviewed and negative except as stated in HPI  Blood pressure 115/67, pulse 82, temperature 98.9 F (37.2 C), temperature source Oral, resp. rate 18, SpO2 99 %. General appearance: alert, cooperative and no distress Lungs: clear to auscultation bilaterally Heart: regular rate and rhythm Abdomen: soft, non-tender; bowel sounds normal Pelvic: n/a Extremities: Homans sign is negative, no sign of DVT DTR's +2  Presentation: cephalic Fetal monitoringBaseline: 145 bpm, Variability: Good {> 6 bpm), Accelerations: Reactive and Decelerations: Absent Uterine activity: occasional uc's     Prenatal labs: ABO, Rh: O/Positive/-- (08/24 1449) Antibody: Negative (08/24 1449) Rubella: 21.40 (08/24 1449) RPR: Non Reactive (10/26 0820)  HBsAg: Negative (08/24 1449)  HIV: Non Reactive (10/26 0820)  GBS: Negative/-- (01/13 1638)   Prenatal Transfer Tool  Maternal Diabetes: No Genetic Screening: Normal Maternal Ultrasounds/Referrals: Normal Fetal Ultrasounds or other Referrals:  None Maternal Substance Abuse:  No Significant Maternal Medications:  None Significant Maternal Lab Results: Group B Strep negative  No results found for this or any previous visit (from the past 24 hour(s)).  Patient Active  Problem List   Diagnosis Date Noted  . Personal history of previous postdates pregnancy 08/23/2020  . Supervision of low-risk pregnancy 03/25/2020  . Pelvic pain 02/12/2019  . Language barrier affecting health care 05/20/2015    Assessment/Plan:  Sandra Sexton is a 25 y.o. G4P3003 at 24w0dhere for IOL for decreased fetal movement at term  #Labor:cytotec and FB when able, vertex confirmed by bedside u/s #Pain: Per patient request #FWB: Cat 1 #ID:  GBS neg #MOF: Breast #MOC: unsure #Circ:  nIlion CNM  08/23/2020, 12:56 PM

## 2020-08-24 ENCOUNTER — Inpatient Hospital Stay (HOSPITAL_COMMUNITY): Payer: Medicaid Other

## 2020-08-24 ENCOUNTER — Encounter (HOSPITAL_COMMUNITY): Payer: Self-pay | Admitting: Obstetrics & Gynecology

## 2020-08-24 DIAGNOSIS — R109 Unspecified abdominal pain: Secondary | ICD-10-CM | POA: Diagnosis not present

## 2020-08-24 LAB — RPR: RPR Ser Ql: NONREACTIVE

## 2020-08-24 MED ORDER — ACETAMINOPHEN 325 MG PO TABS
650.0000 mg | ORAL_TABLET | Freq: Four times a day (QID) | ORAL | Status: DC
  Administered 2020-08-24 – 2020-08-25 (×5): 650 mg via ORAL
  Filled 2020-08-24 (×5): qty 2

## 2020-08-24 MED ORDER — DIPHENHYDRAMINE HCL 25 MG PO CAPS: 25.0000 mg | ORAL_CAPSULE | Freq: Four times a day (QID) | ORAL | Status: DC | PRN

## 2020-08-24 MED ORDER — PRENATAL MULTIVITAMIN CH
1.0000 | ORAL_TABLET | Freq: Every day | ORAL | Status: DC
  Administered 2020-08-24: 1 via ORAL
  Filled 2020-08-24 (×2): qty 1

## 2020-08-24 MED ORDER — COCONUT OIL OIL: 1.0000 "application " | TOPICAL_OIL | Status: DC | PRN

## 2020-08-24 MED ORDER — TETANUS-DIPHTH-ACELL PERTUSSIS 5-2.5-18.5 LF-MCG/0.5 IM SUSY: 0.5000 mL | PREFILLED_SYRINGE | Freq: Once | INTRAMUSCULAR | Status: DC

## 2020-08-24 MED ORDER — IBUPROFEN 600 MG PO TABS
600.0000 mg | ORAL_TABLET | Freq: Four times a day (QID) | ORAL | Status: DC
  Administered 2020-08-24 – 2020-08-25 (×4): 600 mg via ORAL
  Filled 2020-08-24 (×5): qty 1

## 2020-08-24 MED ORDER — WITCH HAZEL-GLYCERIN EX PADS: 1.0000 "application " | MEDICATED_PAD | CUTANEOUS | Status: DC | PRN

## 2020-08-24 MED ORDER — ONDANSETRON HCL 4 MG PO TABS: 4.0000 mg | ORAL_TABLET | ORAL | Status: DC | PRN

## 2020-08-24 MED ORDER — SENNOSIDES-DOCUSATE SODIUM 8.6-50 MG PO TABS
2.0000 | ORAL_TABLET | Freq: Every day | ORAL | Status: DC
  Administered 2020-08-25: 2 via ORAL
  Filled 2020-08-24: qty 2

## 2020-08-24 MED ORDER — ONDANSETRON HCL 4 MG/2ML IJ SOLN: 4.0000 mg | INTRAMUSCULAR | Status: DC | PRN

## 2020-08-24 MED ORDER — BENZOCAINE-MENTHOL 20-0.5 % EX AERO: 1.0000 "application " | INHALATION_SPRAY | CUTANEOUS | Status: DC | PRN

## 2020-08-24 MED ORDER — DIBUCAINE (PERIANAL) 1 % EX OINT: 1.0000 "application " | TOPICAL_OINTMENT | CUTANEOUS | Status: DC | PRN

## 2020-08-24 MED ORDER — SIMETHICONE 80 MG PO CHEW: 80.0000 mg | CHEWABLE_TABLET | ORAL | Status: DC | PRN

## 2020-08-24 NOTE — Discharge Instructions (Signed)

## 2020-08-24 NOTE — Anesthesia Postprocedure Evaluation (Signed)
Anesthesia Post Note  Patient: Sandra Sexton  Procedure(s) Performed: AN AD HOC LABOR EPIDURAL     Patient location during evaluation: Mother Baby Anesthesia Type: Epidural Level of consciousness: awake Pain management: satisfactory to patient Vital Signs Assessment: post-procedure vital signs reviewed and stable Respiratory status: spontaneous breathing Cardiovascular status: stable Anesthetic complications: no   No complications documented.  Last Vitals:  Vitals:   08/24/20 0650 08/24/20 1110  BP: 107/71 114/72  Pulse: 81 64  Resp: 17 18  Temp: 36.7 C 36.4 C  SpO2: 100%     Last Pain:  Vitals:   08/24/20 1110  TempSrc: Axillary  PainSc:    Pain Goal: Patients Stated Pain Goal: 0 (08/23/20 1920)     Report via Swahili translator            Cephus Shelling

## 2020-08-24 NOTE — Progress Notes (Signed)
Pt received epidural per request as noted in record. Pt reported good pain control other than lower right side, pt turned to right and epidural bolus button pressed in attempt to control "hot spot" pain. Pt then reported urge to push, and involuntarily bearing down. Fetus crowning, call to OB, and second hands to assist.

## 2020-08-24 NOTE — Progress Notes (Addendum)
Declined stratus interpreter, husband interprets, consents previously signed  Post Partum Day 1 Subjective: Patient complaining of abdominal and back pain, not getting up yet, tolerating PO, not passing flatus. Pain improved with tylenol/ibuprofen.  Objective: Blood pressure 107/71, pulse 81, temperature 98 F (36.7 C), temperature source Oral, resp. rate 17, SpO2 100 %, unknown if currently breastfeeding.  Physical Exam:  General: alert, cooperative, appears stated age and mild distress  Abd: soft, mildly tender diffusely, non distended, hypoactive bowel sounds Lochia: appropriate Uterine Fundus: firm Incision: n/a DVT Evaluation: No evidence of DVT seen on physical exam.  Recent Labs    08/23/20 1234  HGB 10.9*  HCT 34.8*    Assessment/Plan:  #PPD#1 VD  -doing well, except as noted below  -meeting PP milestones  -VSS  -counseled on contraception, outpatient liletta  -breastfeeding, going well  #Abdominal Pain: patient has moderate abdominal pain with reassuring physical exam, but no flatus yet and hypoactive bowel sounds, likely due to COVID+ status, but will get KUB to assess for obstruction. Continue to monitor.  #Back pain: patient getting scheduled tylenol and ibuprofen, suspect bed vs COVID+, recommend k pad.  #Postpartum: continue current care, plan for discharge tomorrow.    LOS: 1 day   Shirlean Mylar 08/24/2020, 7:59 AM   GME ATTESTATION:  I saw and evaluated the patient. I agree with the findings and the plan of care as documented in the resident's note.  Alric Seton, MD OB Fellow, Faculty [facility] 08/24/2020 9:10 AM

## 2020-08-24 NOTE — Discharge Summary (Signed)
Postpartum Discharge Summary    Patient Name: Sandra Sexton DOB: 07/17/1996 MRN: [id number]  Date of admission: 08/23/2020 Delivery date:08/23/2020  Delivering provider: Randa Ngo  Date of discharge: 08/25/2020  Admitting diagnosis: Personal history of previous postdates pregnancy [Z87.59] Intrauterine pregnancy: [redacted]w[redacted]d    Secondary diagnosis:  Principal Problem:   Vaginal delivery Active Problems:   Language barrier affecting health care   Personal history of previous postdates pregnancy   COVID-19 affecting childbirth  Additional problems: as noted above  Discharge diagnosis: Term Pregnancy Delivered                                              Post partum procedures:none Augmentation: Cytotec Complications: None  Hospital course: Induction of Labor With Vaginal Delivery   25y.o. yo [id number] 4 26w0das admitted to the hospital 08/23/2020 for induction of labor.  Indication for induction: decreased fetal movement.  Patient had an uncomplicated labor course as follows: Membrane Rupture Time/Date: 11:20 PM ,08/23/2020   Delivery Method:Vaginal, Spontaneous  Episiotomy: None  Lacerations:  1st degree  Details of delivery can be found in separate delivery note.  Patient had a routine postpartum course. Patient is discharged home 08/25/20.  Newborn Data: Birth date:08/23/2020  Birth time:11:24 PM  Gender:Female  Living status:Living  Apgars:8 ,9  Weight:2980 g   Magnesium Sulfate received: No BMZ received: No Rhophylac:N/A MMR:N/A T-DaP:Given prenatally Flu: offered prior to discharge Transfusion:No  Physical exam  Vitals:   08/24/20 1110 08/24/20 1424 08/24/20 1520 08/24/20 2120  BP: 114/72 112/68 111/64 103/65  Pulse: 64 83 78 73  Resp: 18 18 18 17   Temp: 97.6 F (36.4 C) 98.6 F (37 C)  97.7 F (36.5 C)  TempSrc: Axillary Oral  Oral  SpO2:  99%  100%   General: alert, cooperative and no distress Lochia: appropriate Uterine Fundus: firm Incision:  N/A DVT Evaluation: No evidence of DVT seen on physical exam. No cords or calf tenderness. No significant calf/ankle edema. Labs: Lab Results  Component Value Date   WBC 4.4 08/23/2020   HGB 10.9 (L) 08/23/2020   HCT 34.8 (L) 08/23/2020   MCV 92.6 08/23/2020   PLT 130 (L) 08/23/2020   CMP Latest Ref Rng & Units 03/25/2020  Glucose 65 - 99 mg/dL 67  BUN 6 - 20 mg/dL 5(L)  Creatinine 0.57 - 1.00 mg/dL 0.45(L)  Sodium 134 - 144 mmol/L 136  Potassium 3.5 - 5.2 mmol/L 4.6  Chloride 96 - 106 mmol/L 102  CO2 20 - 29 mmol/L 21  Calcium 8.7 - 10.2 mg/dL 8.7  Total Protein 6.0 - 8.5 g/dL 7.0  Total Bilirubin 0.0 - 1.2 mg/dL <0.2  Alkaline Phos 48 - 121 IU/L 61  AST 0 - 40 IU/L 12  ALT 0 - 32 IU/L 7   Edinburgh Score: Edinburgh Postnatal Depression Scale Screening Tool 08/24/2020  I have been able to laugh and see the funny side of things. (No Data)  I have looked forward with enjoyment to things. -  I have blamed myself unnecessarily when things went wrong. -  I have been anxious or worried for no good reason. -  I have felt scared or panicky for no good reason. -  Things have been getting on top of me. -  I have been so unhappy that I have had difficulty sleeping. -  I  have felt sad or miserable. -  I have been so unhappy that I have been crying. -  The thought of harming myself has occurred to me. Flavia Shipper Postnatal Depression Scale Total -     After visit meds:  Allergies as of 08/25/2020   No Known Allergies     Medication List    STOP taking these medications   Blood Pressure Kit Devi   Comfort Fit Maternity Supp Sm Misc   docusate sodium 100 MG capsule Commonly known as: Colace     TAKE these medications   acetaminophen 325 MG tablet Commonly known as: Tylenol Take 2 tablets (650 mg total) by mouth every 6 (six) hours as needed for mild pain, moderate pain, fever or headache.   coconut oil Oil Apply 1 application topically as needed (nipple pain).    ibuprofen 600 MG tablet Commonly known as: ADVIL Take 1 tablet (600 mg total) by mouth every 8 (eight) hours as needed for moderate pain or cramping.   PRENATAL PO Take by mouth.        Discharge home in stable condition Infant Feeding: Breast Infant Disposition:home with mother Discharge instruction: per After Visit Summary and Postpartum booklet. Activity: Advance as tolerated. Pelvic rest for 6 weeks.  Diet: routine diet Future Appointments: Future Appointments  Date Time Provider [place]  08/26/2020  9:15 AM Clarnce Flock, MD [facility]  08/28/2020  9:45 AM [facility] None   Follow up Visit: Message sent to [facility] to schedule PP appt on 08/24/20.  Please schedule this patient for a In person postpartum visit in 6 weeks with the following provider: Any provider. Additional Postpartum F/U:none  Low risk pregnancy complicated by: [id number] (diagnosed on 08/23/20) Delivery mode:  Vaginal, Spontaneous  Anticipated Birth Control:  plan for outpatient Liletta IUD  Jakub Debold, Gildardo Cranker, MD OB Fellow, Faculty Practice 08/25/2020 9:27 AM

## 2020-08-25 ENCOUNTER — Encounter: Payer: Self-pay | Admitting: Obstetrics and Gynecology

## 2020-08-25 DIAGNOSIS — U071 COVID-19: Secondary | ICD-10-CM

## 2020-08-25 MED ORDER — ACETAMINOPHEN 325 MG PO TABS: 650.0000 mg | ORAL_TABLET | Freq: Four times a day (QID) | ORAL | Status: DC | PRN

## 2020-08-25 MED ORDER — COCONUT OIL OIL: 1.0000 "application " | TOPICAL_OIL | 0 refills | Status: DC | PRN

## 2020-08-25 MED ORDER — IBUPROFEN 600 MG PO TABS: 600.0000 mg | ORAL_TABLET | Freq: Three times a day (TID) | ORAL | 0 refills | Status: DC | PRN

## 2020-08-25 NOTE — Lactation Note (Signed)
This note was copied from a baby's chart. Lactation Consultation Note  Patient Name: Sandra Sexton RNHAF'B Date: 08/25/2020   Age:26 hours   Mother declined lactation services.  LC available if needed in the future.   Maternal Data    Feeding    LATCH Score                   Interventions    Lactation Tools Discussed/Used     Consult Status      [name] R [name] 08/25/2020, 11:03 AM

## 2020-08-26 ENCOUNTER — Telehealth: Payer: Medicaid Other | Admitting: Family Medicine

## 2020-08-28 ENCOUNTER — Other Ambulatory Visit (HOSPITAL_COMMUNITY): Admission: RE | Admit: 2020-08-28 | Payer: Medicaid Other | Source: Ambulatory Visit

## 2020-08-30 ENCOUNTER — Inpatient Hospital Stay (HOSPITAL_COMMUNITY): Payer: Medicaid Other

## 2020-10-01 ENCOUNTER — Ambulatory Visit: Payer: Medicaid Other | Admitting: Nurse Practitioner

## 2020-10-06 ENCOUNTER — Encounter: Payer: Self-pay | Admitting: Family Medicine

## 2020-10-06 ENCOUNTER — Ambulatory Visit (INDEPENDENT_AMBULATORY_CARE_PROVIDER_SITE_OTHER): Payer: Medicaid Other | Admitting: Family Medicine

## 2020-10-06 ENCOUNTER — Other Ambulatory Visit: Payer: Self-pay

## 2020-10-06 VITALS — BP 118/83 | HR 91 | Wt 169.0 lb

## 2020-10-06 DIAGNOSIS — Z975 Presence of (intrauterine) contraceptive device: Secondary | ICD-10-CM

## 2020-10-06 DIAGNOSIS — Z30430 Encounter for insertion of intrauterine contraceptive device: Secondary | ICD-10-CM | POA: Diagnosis not present

## 2020-10-06 DIAGNOSIS — Z3202 Encounter for pregnancy test, result negative: Secondary | ICD-10-CM

## 2020-10-06 LAB — POCT PREGNANCY, URINE: Preg Test, Ur: NEGATIVE

## 2020-10-06 MED ORDER — PARAGARD INTRAUTERINE COPPER IU IUD
INTRAUTERINE_SYSTEM | Freq: Once | INTRAUTERINE | Status: AC
  Administered 2020-10-06: 1 via INTRAUTERINE

## 2020-10-06 NOTE — Progress Notes (Signed)
Post Partum Visit Note  Sandra Sexton is a 25 y.o. [phone] female who presents for a postpartum visit. She is 6 weeks postpartum following a normal spontaneous vaginal delivery.  I have fully reviewed the prenatal and intrapartum course. The delivery was at 40.1 gestational weeks.  Anesthesia: epidural. Postpartum course has been uncomplicated. Baby is doing well. Baby is feeding by breast. Bleeding no bleeding. Bowel function is normal. Bladder function is normal. Patient is not sexually active. Contraception method is none. Postpartum depression screening: negative.   The pregnancy intention screening data noted above was reviewed. Potential methods of contraception were discussed. The patient elected to proceed with IUD or IUS.    Edinburgh Postnatal Depression Scale - 10/06/20 1413      Edinburgh Postnatal Depression Scale:  In the Past 7 Days   I have been able to laugh and see the funny side of things. 0    I have looked forward with enjoyment to things. 0    I have blamed myself unnecessarily when things went wrong. 0    I have been anxious or worried for no good reason. 0    I have felt scared or panicky for no good reason. 0    Things have been getting on top of me. 0    I have been so unhappy that I have had difficulty sleeping. 0    I have felt sad or miserable. 0    I have been so unhappy that I have been crying. 0    The thought of harming myself has occurred to me. 0    Edinburgh Postnatal Depression Scale Total 0            The following portions of the patient's history were reviewed and updated as appropriate: allergies, current medications, past family history, past medical history, past social history, past surgical history and problem list.  Review of Systems Pertinent items are noted in HPI.    Objective:  BP 118/83   Pulse 91   Wt 169 lb (76.7 kg)   LMP  (LMP Unknown)   Breastfeeding Yes   BMI 28.12 kg/m    General:  alert, cooperative and  appears stated age   Breasts:  patient with no concerns. experienced nursing mother  Lungs: normal WOB  Heart:  RR  Abdomen: Soft    Vulva:  normal  Vagina: normal vagina. Vaginal wall laxity, appears to have mild cystoceole.   Cervix:  multiparous appearance  Corpus: normal  Adnexa:  not evaluated  Rectal Exam: Not performed.        IUD Insertion Procedure Note Patient identified, informed consent performed, consent signed.   Discussed risks of irregular bleeding, cramping, infection, malpositioning or misplacement of the IUD outside the uterus which may require further procedure such as laparoscopy. Time out was performed.  Urine pregnancy test negative.  Speculum placed in the vagina.  Cervix visualized.  Cleaned with Betadine x 2.   Uterus sounded to 9 cm.  IUD placed per manufacturer's recommendations.  Strings trimmed to 3 cm. good hemostasis noted.  Patient tolerated procedure well.   Patient was given post-procedure instructions.  She was advised to have backup contraception for one week.  Patient was also asked to check IUD strings periodically and follow up in 4 weeks for IUD check.  Assessment:    normal postpartum exam. Pap smear not done at today's visit.   Plan:   Essential components of care per ACOG recommendations:  1.  Mood and well being: Patient with negative depression screening today. Reviewed local resources for support.  - Patient does not use tobacco. If using tobacco we discussed reduction and for recently cessation risk of relapse - hx of drug use? No  2. Infant care and feeding:  -Patient currently breastmilk feeding? Yes  Reviewed importance of draining breast regularly to support lactation. She is exclusively breastfeeding. She typically gets her period back at 3 months.  -Social determinants of health (SDOH) reviewed in [place]. No concerns  3. Sexuality, contraception and birth spacing - Patient does not want a pregnancy in the next year.  Desired  family size is 4-5 children.  - Reviewed forms of contraception in tiered fashion. Patient desired IUD today.  We discussed cu-IUD vs LNG IUD. Patient desires a period and opted for Cu-IUD.  - Discussed birth spacing of 18 months  4. Sleep and fatigue -Encouraged family/partner/community support of 4 hrs of uninterrupted sleep to help with mood and fatigue  5. Physical Recovery  - Discussed patients delivery and complications - Patient had a 1st degree laceration, perineal healing reviewed. Patient expressed understanding - Patient has urinary incontinence? No - Patient is safe to resume physical and sexual activity  6.  Health Maintenance - Last pap smear done 01/2020 and was abnormal with ASCUS with negative HPV. Next pap in 3 years Mammogram@39   7. Chronic Disease - PCP follow up  Federico Flake, MD Center for [facility], [facility]

## 2020-10-22 ENCOUNTER — Other Ambulatory Visit: Payer: Self-pay

## 2020-10-22 NOTE — Patient Instructions (Signed)
Visit Information  Ms. Bosie Helper  - as a part of your Medicaid benefit, you are eligible for care management and care coordination services at no cost or copay. I was unable to reach you by phone today but would be happy to help you with your health related needs. Please feel free to call me @ [phone].   A member of the Managed Medicaid care management team will reach out to you again over the next 7 days.   Gus Puma, BSW, [facility]  [place]  High Risk Managed Medicaid Team

## 2020-10-22 NOTE — Patient Outreach (Signed)
Care Coordination  10/22/2020  Flonnie Wierman Jun 08, 1996 [id number]   Medicaid Managed Care   Unsuccessful Outreach Note  10/22/2020 Name: Obie Silos MRN: [id number] DOB: 1995-09-29  Referred by: Patient, No Pcp Per Reason for referral : High Risk Managed Medicaid (MM Screen Unsuccessful Telephone Outreach)   An unsuccessful telephone outreach was attempted today. The patient was referred to the case management team for assistance with care management and care coordination.   Follow Up Plan: The care management team will reach out to the patient again over the next 7 days.   Gus Puma, BSW, [facility]    High Risk Managed Medicaid Team

## 2020-10-29 ENCOUNTER — Encounter: Payer: Self-pay | Admitting: General Practice

## 2020-10-31 ENCOUNTER — Other Ambulatory Visit: Payer: Self-pay

## 2020-10-31 NOTE — Patient Outreach (Signed)
  Medicaid Managed Care Social Work Note  10/31/2020 Name:  Sandra Sexton MRN:  [id number] DOB:  Oct 17, 1995  Sandra Sexton is an 25 y.o. year old female who is a primary patient of Patient, No Pcp Per (Inactive).  The [facility] Managed Care Coordination team was consulted for assistance with:  Interpersonal safety  and PCP  Ms. Dismuke was given information about Medicaid Managed CareCoordination services today. Bosie Helper agreed to services and verbal consent obtained.  Engaged with patient  for by telephone forinitial visit in response to referral for case management and/or care coordination services.   Assessments/Interventions:  Review of past medical history, allergies, medications, health status, including review of consultants reports, laboratory and other test data, was performed as part of comprehensive evaluation and provision of chronic care management services.  SDOH: (Social Determinant of Health) assessments and interventions performed:  BSW contacted patient regarding UHC survey, BSW spoke with patient and her husband, they stated they are safe and are receiving foodstamps, they are able to pay their rent and utilities right now. Patient did ask to be set up with a PCP, BSW contacted [facility] [phone], [address] [place], [place], [place] [address] and scheduled patient a new PCP appointment on 11/17/20 at 9:30am.   Advanced Directives Status:  See Care Plan for related entries.  Care Plan                 No Known Allergies  Medications Reviewed Today    Reviewed by Federico Flake, MD (Physician) on 10/06/20 at 1512  Med List Status: <None>  Medication Order Taking? Sig Documenting Provider Last Dose Status Informant  Prenatal Vit-Fe Fumarate-FA (PRENATAL PO) [id number] Yes Take by mouth. [provider] Taking Active           Patient Active Problem List   Diagnosis Date Noted  . IUD (intrauterine device) in place  10/06/2020  . Language barrier affecting health care 05/20/2015    Conditions to be addressed/monitored per PCP order:  PCP  There are no care plans that you recently modified to display for this patient.   Follow up:  Patient requests no follow-up at this time.  Plan: The patient has been provided with contact information for the Managed Medicaid care management team and has been advised to call with any health related questions or concerns.   Gus Puma, BSW, [facility]  [place]  High Risk Managed Medicaid Team

## 2020-10-31 NOTE — Patient Instructions (Signed)
Visit Information  Sandra Sexton was given information about Medicaid Managed Care team care coordination services as a part of their [facility] Community Plan Medicaid benefit. Sandra Sexton did not consent to engagement with the [facility] Managed Care team.   For questions related to your [facility], please call: [phone] or visit the homepage here: [email or web address]  If you would like to schedule transportation through your [facility], please call the following number at least 2 days in advance of your appointment: [phone].   Call the Behavioral Health Crisis Line at [phone], at any time, 24 hours a day, 7 days a week. If you are in danger or need immediate medical attention call 911.  Sandra Sexton - following are the goals we discussed in your visit today:  Goals Addressed   None      The patient has been provided with contact information for the Managed Medicaid care management team and has been advised to call with any health related questions or concerns.   Sandra Sexton, BSW, [facility]  [place]  High Risk Managed Medicaid Team    Following is a copy of your plan of care:  There are no care plans to display for this patient.

## 2020-11-13 ENCOUNTER — Encounter (HOSPITAL_COMMUNITY): Payer: Self-pay

## 2020-11-13 ENCOUNTER — Ambulatory Visit (HOSPITAL_COMMUNITY)
Admission: EM | Admit: 2020-11-13 | Discharge: 2020-11-13 | Disposition: A | Discharge status: Home / Self Care | Payer: Medicaid Other | Attending: Family Medicine | Admitting: Family Medicine

## 2020-11-13 ENCOUNTER — Other Ambulatory Visit: Payer: Self-pay

## 2020-11-13 DIAGNOSIS — H1013 Acute atopic conjunctivitis, bilateral: Secondary | ICD-10-CM

## 2020-11-13 DIAGNOSIS — J302 Other seasonal allergic rhinitis: Secondary | ICD-10-CM | POA: Diagnosis not present

## 2020-11-13 MED ORDER — AZELASTINE HCL 0.05 % OP SOLN: 1.0000 [drp] | Freq: Two times a day (BID) | OPHTHALMIC | 2 refills | Status: DC

## 2020-11-13 MED ORDER — CETIRIZINE HCL 10 MG PO TABS: 10.0000 mg | ORAL_TABLET | Freq: Every day | ORAL | 2 refills | Status: DC

## 2020-11-13 NOTE — ED Triage Notes (Signed)
Pt presents with bilateral eye itchiness and crusted in the morning x 4 days.

## 2020-11-13 NOTE — ED Provider Notes (Signed)
[facility]    CSN: [id number] Arrival date & time: 11/13/20  0855      History   Chief Complaint Chief Complaint  Patient presents with  . Eye Problem    HPI Sandra Sexton is a 25 y.o. female.   Patient presenting today with history of acute on chronic bilateral eye irritation, itching, clear drainage.  States sometimes they are slightly red in color as well.  This seems to be worse in the spring and fall the past few years.  She is also having runny nose, postnasal drainage, cough associated.  Denies fever, chills, recent sick contacts.  Not trying anything over-the-counter for symptoms.  Of note, she is breast-feeding currently.     Past Medical History:  Diagnosis Date  . H/O malaria 2015   [place]    Patient Active Problem List   Diagnosis Date Noted  . IUD (intrauterine device) in place 10/06/2020  . Language barrier affecting health care 05/20/2015    Past Surgical History:  Procedure Laterality Date  . NO PAST SURGERIES      OB History    Gravida  4   Para  4   Term  4   Preterm  0   AB  0   Living  4     SAB  0   IAB  0   Ectopic  0   Multiple  0   Live Births  4            Home Medications    Prior to Admission medications   Medication Sig Start Date End Date Taking? Authorizing Provider  azelastine (OPTIVAR) 0.05 % ophthalmic solution Place 1 drop into both eyes 2 (two) times daily. 11/13/20  Yes Particia Nearing, PA-C  cetirizine (ZYRTEC ALLERGY) 10 MG tablet Take 1 tablet (10 mg total) by mouth daily. 11/13/20  Yes Particia Nearing, PA-C  Prenatal Vit-Fe Fumarate-FA (PRENATAL PO) Take by mouth.    [provider]    Family History History reviewed. No pertinent family history.  Social History Social History   Tobacco Use  . Smoking status: Never Smoker  . Smokeless tobacco: Never Used  Vaping Use  . Vaping Use: Never used  Substance Use Topics  . Alcohol  use: No  . Drug use: No     Allergies   Patient has no known allergies.   Review of Systems Review of Systems Per HPI  Physical Exam Triage Vital Signs ED Triage Vitals  Enc Vitals Group     BP 11/13/20 1016 135/72     Pulse Rate 11/13/20 1016 71     Resp 11/13/20 1016 17     Temp 11/13/20 1016 98.3 F (36.8 C)     Temp Source 11/13/20 1016 Oral     SpO2 11/13/20 1016 98 %     Weight --      Height --      Head Circumference --      Peak Flow --      Pain Score 11/13/20 1015 0     Pain Loc --      Pain Edu? --      Excl. in GC? --    No data found.  Updated Vital Signs BP 135/72 (BP Location: Right Arm)   Pulse 71   Temp 98.3 F (36.8 C) (Oral)   Resp 17   SpO2 98%   Breastfeeding Yes   Visual Acuity Right Eye Distance:  20/30 (Without correction ) Left Eye Distance: 20/25 (Without correction ) Bilateral Distance: 20/25 (Without correction )  Right Eye Near:   Left Eye Near:    Bilateral Near:     Physical Exam Vitals and nursing note reviewed.  Constitutional:      Appearance: Normal appearance. She is not ill-appearing.  HENT:     Head: Atraumatic.     Nose: Rhinorrhea present.     Comments: Bilateral nasal turbinates boggy and erythematous    Mouth/Throat:     Mouth: Mucous membranes are moist.     Pharynx: Posterior oropharyngeal erythema present.  Eyes:     Extraocular Movements: Extraocular movements intact.     Conjunctiva/sclera: Conjunctivae normal.  Cardiovascular:     Rate and Rhythm: Normal rate and regular rhythm.     Heart sounds: Normal heart sounds.  Pulmonary:     Effort: Pulmonary effort is normal.     Breath sounds: Normal breath sounds.  Musculoskeletal:        General: Normal range of motion.     Cervical back: Normal range of motion and neck supple.  Skin:    General: Skin is warm and dry.  Neurological:     Mental Status: She is alert and oriented to person, place, and time.  Psychiatric:        Mood and Affect:  Mood normal.        Thought Content: Thought content normal.        Judgment: Judgment normal.     UC Treatments / Results  Labs (all labs ordered are listed, but only abnormal results are displayed) Labs Reviewed - No data to display  EKG   Radiology No results found.  Procedures Procedures (including critical care time)  Medications Ordered in UC Medications - No data to display  Initial Impression / Assessment and Plan / UC Course  I have reviewed the triage vital signs and the nursing notes.  Pertinent labs & imaging results that were available during my care of the patient were reviewed by me and considered in my medical decision making (see chart for details).     Symptoms consistent with allergic conjunctivitis and allergic rhinitis.  Will start daily Zyrtec, Flonase and Optivar drops.  Discussed warm compresses as needed.  Follow-up if worsening or not resolving  Final Clinical Impressions(s) / UC Diagnoses   Final diagnoses:  Allergic conjunctivitis of both eyes  Seasonal allergies   Discharge Instructions   None    ED Prescriptions    Medication Sig Dispense Auth. Provider   cetirizine (ZYRTEC ALLERGY) 10 MG tablet Take 1 tablet (10 mg total) by mouth daily. 30 tablet Particia Nearing, [place]   azelastine (OPTIVAR) 0.05 % ophthalmic solution Place 1 drop into both eyes 2 (two) times daily. 6 mL Particia Nearing, [place]     PDMP not reviewed this encounter.   Particia Nearing, [place] 11/13/20 1054

## 2020-11-17 ENCOUNTER — Ambulatory Visit: Payer: Medicaid Other | Admitting: Nurse Practitioner

## 2020-12-25 ENCOUNTER — Encounter: Payer: Self-pay | Admitting: *Deleted

## 2021-03-02 ENCOUNTER — Other Ambulatory Visit: Payer: Self-pay

## 2021-03-02 ENCOUNTER — Encounter (HOSPITAL_COMMUNITY): Payer: Self-pay

## 2021-03-02 ENCOUNTER — Ambulatory Visit (HOSPITAL_COMMUNITY)
Admission: EM | Admit: 2021-03-02 | Discharge: 2021-03-02 | Disposition: A | Discharge status: Home / Self Care | Payer: Medicaid Other | Attending: Emergency Medicine | Admitting: Emergency Medicine

## 2021-03-02 DIAGNOSIS — K2900 Acute gastritis without bleeding: Secondary | ICD-10-CM | POA: Diagnosis not present

## 2021-03-02 DIAGNOSIS — J302 Other seasonal allergic rhinitis: Secondary | ICD-10-CM | POA: Diagnosis not present

## 2021-03-02 DIAGNOSIS — H5789 Other specified disorders of eye and adnexa: Secondary | ICD-10-CM

## 2021-03-02 MED ORDER — PANTOPRAZOLE SODIUM 40 MG PO TBEC: 40.0000 mg | DELAYED_RELEASE_TABLET | Freq: Every day | ORAL | 0 refills | Status: DC

## 2021-03-02 MED ORDER — CETIRIZINE HCL 10 MG PO TABS: 10.0000 mg | ORAL_TABLET | Freq: Every day | ORAL | 0 refills | Status: DC

## 2021-03-02 NOTE — ED Triage Notes (Signed)
C/o pain to stomach and eyes. Both eyes are itching, denies having any drainage.  Started: about a week ago

## 2021-03-02 NOTE — Discharge Instructions (Addendum)
Take the protonix daily to help with abdominal pain.    You can take Tylenol and/or Ibuprofen as need for pain.    Try a BRAT (bananas, rice, applesause, toast) or bland food diet for the next few days.  Stick with foods that are gentle on your stomach.  Avoid foods that are difficult to digest, such as diary.  As you feel better, you can start eating like you do normally.   You can use ginger (ginger ale, ginger candy) and mint for nausea.    Make sure you are drinking plenty of fluids, such as water, powerade/gatorade, pedialyte, juices, and teas.    Take the Zyrtec daily to help with allergies, including eye irritation.    Return or go to the Emergency Department if symptoms worsen or do not improve in the next few days.

## 2021-03-02 NOTE — ED Provider Notes (Signed)
[facility]    CSN: [id number] Arrival date & time: 03/02/21  1550      History   Chief Complaint Chief Complaint  Patient presents with   Eye Problem    HPI Sandra Sexton is a 25 y.o. female.   Patient here for evaluation of bilateral eye itching that has been ongoing for the past week. Reports being evaluated for the same in the past and was given some medication.  Reports eye drops caused eye swelling but that the pills did help with the irritation.  Denies any visual changes or eye drainage.  Reports running out of allergy medication previously prescribed.   Also reports epigastric and upper abdominal pain that has been ongoing for the past week.  Denies any nausea, vomiting, diarrhea, or constipation.  Has not tried any OTC medication or treatment.  Denies any trauma, injury, or other precipitating event.  Denies any specific alleviating or aggravating factors.  Denies any fevers, chest pain, shortness of breath, numbness, tingling, weakness, or headaches.     The history is provided by the patient. The history is limited by a language barrier. A language interpreter was used.  Eye Problem Associated symptoms: itching and redness   Associated symptoms: no discharge, no nausea, no photophobia and no vomiting    Past Medical History:  Diagnosis Date   H/O malaria 2015   [place]    Patient Active Problem List   Diagnosis Date Noted   IUD (intrauterine device) in place 10/06/2020   Language barrier affecting health care 05/20/2015    Past Surgical History:  Procedure Laterality Date   NO PAST SURGERIES      OB History     Gravida  4   Para  4   Term  4   Preterm  0   AB  0   Living  4      SAB  0   IAB  0   Ectopic  0   Multiple  0   Live Births  4            Home Medications    Prior to Admission medications   Medication Sig Start Date End Date Taking? Authorizing Provider  pantoprazole (PROTONIX) 40  MG tablet Take 1 tablet (40 mg total) by mouth daily. 03/02/21  Yes Ivette Loyal, NP  azelastine (OPTIVAR) 0.05 % ophthalmic solution Place 1 drop into both eyes 2 (two) times daily. 11/13/20   Particia Nearing, PA-C  cetirizine (ZYRTEC ALLERGY) 10 MG tablet Take 1 tablet (10 mg total) by mouth daily. 03/02/21   Ivette Loyal, NP  Prenatal Vit-Fe Fumarate-FA (PRENATAL PO) Take by mouth.    [provider]    Family History No family history on file.  Social History Social History   Tobacco Use   Smoking status: Never   Smokeless tobacco: Never  Vaping Use   Vaping Use: Never used  Substance Use Topics   Alcohol use: No   Drug use: No     Allergies   Patient has no known allergies.   Review of Systems Review of Systems  Eyes:  Positive for redness and itching. Negative for photophobia, discharge and visual disturbance.  Gastrointestinal:  Positive for abdominal pain. Negative for abdominal distention, constipation, diarrhea, nausea and vomiting.  All other systems reviewed and are negative.   Physical Exam Triage Vital Signs ED Triage Vitals  Enc Vitals Group     BP 03/02/21  1641 111/63     Pulse Rate 03/02/21 1641 73     Resp 03/02/21 1641 18     Temp 03/02/21 1641 98.6 F (37 C)     Temp Source 03/02/21 1641 Oral     SpO2 03/02/21 1641 100 %     Weight --      Height --      Head Circumference --      Peak Flow --      Pain Score 03/02/21 1645 8     Pain Loc --      Pain Edu? --      Excl. in GC? --    No data found.  Updated Vital Signs BP 111/63 (BP Location: Right Arm)   Pulse 73   Temp 98.6 F (37 C) (Oral)   Resp 18   LMP 02/15/2021   SpO2 100%   Breastfeeding Yes   Visual Acuity Right Eye Distance:   Left Eye Distance:   Bilateral Distance:    Right Eye Near:   Left Eye Near:    Bilateral Near:     Physical Exam Vitals and nursing note reviewed.  Constitutional:      General: She is not in acute distress.     Appearance: Normal appearance. She is not ill-appearing, toxic-appearing or diaphoretic.  HENT:     Head: Normocephalic and atraumatic.  Eyes:     Extraocular Movements: Extraocular movements intact.     Conjunctiva/sclera: Conjunctivae normal.     Pupils: Pupils are equal, round, and reactive to light.  Cardiovascular:     Rate and Rhythm: Normal rate.     Pulses: Normal pulses.     Heart sounds: Normal heart sounds.  Pulmonary:     Effort: Pulmonary effort is normal.     Breath sounds: Normal breath sounds.  Abdominal:     General: Abdomen is flat. Bowel sounds are normal. There is no distension.     Palpations: Abdomen is soft. There is no mass.     Tenderness: There is no abdominal tenderness. There is no right CVA tenderness, left CVA tenderness, guarding or rebound.     Hernia: No hernia is present.  Musculoskeletal:        General: Normal range of motion.     Cervical back: Normal range of motion.  Skin:    General: Skin is warm and dry.  Neurological:     General: No focal deficit present.     Mental Status: She is alert and oriented to person, place, and time.  Psychiatric:        Mood and Affect: Mood normal.     UC Treatments / Results  Labs (all labs ordered are listed, but only abnormal results are displayed) Labs Reviewed - No data to display  EKG   Radiology No results found.  Procedures Procedures (including critical care time)  Medications Ordered in UC Medications - No data to display  Initial Impression / Assessment and Plan / UC Course  I have reviewed the triage vital signs and the nursing notes.  Pertinent labs & imaging results that were available during my care of the patient were reviewed by me and considered in my medical decision making (see chart for details).    Assessment negative for red flags and concerns.  Likely eye irritation related to seasonal allergies.  Will send in refill on zyrtec pills and take daily.  May try OTC eye  drops and Flonase to help.   Acute  gastritis.  Will treat with protonix daily.  Discussed BRAT or bland food diet for a few days and then advance as tolerated.  Encouraged fluids and rest.  Follow up with primary care provider for re-evaluation.  Strict ED follow up for any worsening symptoms.   Final Clinical Impressions(s) / UC Diagnoses   Final diagnoses:  Eye irritation  Seasonal allergies  Acute gastritis without hemorrhage, unspecified gastritis type     Discharge Instructions      Take the protonix daily to help with abdominal pain.    You can take Tylenol and/or Ibuprofen as need for pain.    Try a BRAT (bananas, rice, applesause, toast) or bland food diet for the next few days.  Stick with foods that are gentle on your stomach.  Avoid foods that are difficult to digest, such as diary.  As you feel better, you can start eating like you do normally.   You can use ginger (ginger ale, ginger candy) and mint for nausea.    Make sure you are drinking plenty of fluids, such as water, powerade/gatorade, pedialyte, juices, and teas.    Take the Zyrtec daily to help with allergies, including eye irritation.    Return or go to the Emergency Department if symptoms worsen or do not improve in the next few days.      ED Prescriptions     Medication Sig Dispense Auth. Provider   cetirizine (ZYRTEC ALLERGY) 10 MG tablet Take 1 tablet (10 mg total) by mouth daily. 30 tablet Ivette Loyal, NP   pantoprazole (PROTONIX) 40 MG tablet Take 1 tablet (40 mg total) by mouth daily. 30 tablet Ivette Loyal, NP      PDMP not reviewed this encounter.   Ivette Loyal, NP 03/03/21 [phone]

## 2021-03-26 ENCOUNTER — Ambulatory Visit (INDEPENDENT_AMBULATORY_CARE_PROVIDER_SITE_OTHER): Payer: Medicaid Other | Admitting: Primary Care

## 2021-06-11 ENCOUNTER — Other Ambulatory Visit: Payer: Self-pay

## 2021-06-11 ENCOUNTER — Encounter: Payer: Medicaid Other | Admitting: Family Medicine

## 2021-08-21 ENCOUNTER — Ambulatory Visit (INDEPENDENT_AMBULATORY_CARE_PROVIDER_SITE_OTHER): Payer: Medicaid Other | Admitting: Primary Care

## 2021-08-21 ENCOUNTER — Other Ambulatory Visit: Payer: Self-pay

## 2021-08-21 ENCOUNTER — Encounter (INDEPENDENT_AMBULATORY_CARE_PROVIDER_SITE_OTHER): Payer: Self-pay | Admitting: Primary Care

## 2021-08-21 VITALS — BP 113/71 | HR 81 | Temp 97.5°F | Ht 63.5 in | Wt 185.8 lb

## 2021-08-21 DIAGNOSIS — G43109 Migraine with aura, not intractable, without status migrainosus: Secondary | ICD-10-CM | POA: Diagnosis not present

## 2021-08-21 DIAGNOSIS — N3001 Acute cystitis with hematuria: Secondary | ICD-10-CM

## 2021-08-21 DIAGNOSIS — Z7689 Persons encountering health services in other specified circumstances: Secondary | ICD-10-CM | POA: Diagnosis not present

## 2021-08-21 DIAGNOSIS — F418 Other specified anxiety disorders: Secondary | ICD-10-CM | POA: Diagnosis not present

## 2021-08-21 DIAGNOSIS — Z131 Encounter for screening for diabetes mellitus: Secondary | ICD-10-CM

## 2021-08-21 DIAGNOSIS — R1031 Right lower quadrant pain: Secondary | ICD-10-CM

## 2021-08-21 LAB — POCT URINALYSIS DIP (CLINITEK)
Bilirubin, UA: NEGATIVE
Glucose, UA: NEGATIVE mg/dL
Ketones, POC UA: NEGATIVE mg/dL
Nitrite, UA: NEGATIVE
POC PROTEIN,UA: NEGATIVE
Spec Grav, UA: 1.025 (ref 1.010–1.025)
Urobilinogen, UA: 0.2 E.U./dL
pH, UA: 6.5 (ref 5.0–8.0)

## 2021-08-21 LAB — POCT GLYCOSYLATED HEMOGLOBIN (HGB A1C): Hemoglobin A1C: 5.6 % (ref 4.0–5.6)

## 2021-08-21 MED ORDER — IBUPROFEN 600 MG PO TABS: 600.0000 mg | ORAL_TABLET | Freq: Three times a day (TID) | ORAL | 0 refills | Status: DC | PRN

## 2021-08-21 NOTE — Progress Notes (Signed)
New Patient Office Visit  Subjective:  Patient ID: Juliette Standre, female    DOB: 05-03-1996  Age: 26 y.o. MRN: [id number]  CC:  Chief Complaint  Patient presents with   New Patient (Initial Visit)    HPI Ms.Elva Mauro is a 26 year old Swahili female ( interpreter Luisa Hart [phone]) who presents for establishment of care. She voices concerns about abdominal pain she has been experiencing for 3 years on right side even uncomfortable trying to lay on that side. Pain level -6/10- 10/10- Aggravating factors - none just recurring pain( when she touches it she can feel pain.  Alleviating factors lying on the opposite side. She does have headaches- photosensitivity and dizziness relieved by tylenol unable to identify any association.  No chest pain, No Nausea, No new weakness tingling or numbness, No Cough - shortness of breath   Past Medical History:  Diagnosis Date   H/O malaria 2015   [place]    Past Surgical History:  Procedure Laterality Date   NO PAST SURGERIES      History reviewed. No pertinent family history.  Social History   Socioeconomic History   Marital status: Married    Spouse name: Not on file   Number of children: Not on file   Years of education: Not on file   Highest education level: Not on file  Occupational History   Not on file  Tobacco Use   Smoking status: Never   Smokeless tobacco: Never  Vaping Use   Vaping Use: Never used  Substance and Sexual Activity   Alcohol use: No   Drug use: No   Sexual activity: Yes  Other Topics Concern   Not on file  Social History Narrative   Not on file   Social Determinants of Health   Financial Resource Strain: Not on file  Food Insecurity: Not on file  Transportation Needs: Not on file  Physical Activity: Not on file  Stress: Not on file  Social Connections: Not on file  Intimate Partner Violence: Not on file    ROS Comprehensive ROS Pertinent positive and negative noted  in HPI    Objective:   Today's Vitals: BP 113/71 (BP Location: Right Arm, Patient Position: Sitting, Cuff Size: Large)    Pulse 81    Temp (!) 97.5 F (36.4 C) (Temporal)    Ht 5' 3.5" (1.613 m)    Wt 185 lb 12.8 oz (84.3 kg)    LMP 08/21/2021    SpO2 98%    Breastfeeding Yes    BMI 32.40 kg/m   Physical exam: General: Vital signs reviewed.  Patient is well-developed and well-nourished, obese female in no acute distress and cooperative with exam. Head: Normocephalic and atraumatic. Eyes: EOMI, conjunctivae normal, no scleral icterus. Neck: Supple, trachea midline, normal ROM, no JVD, masses, thyromegaly, or carotid bruit present. Cardiovascular: RRR, S1 normal, S2 normal, no murmurs, gallops, or rubs. Pulmonary/Chest: Clear to auscultation bilaterally, no wheezes, rales, or rhonchi. Abdominal: Soft, non-tender, non-distended, BS +, no masses, organomegaly, or guarding present. Musculoskeletal: No joint deformities, erythema, or stiffness, ROM full and nontender. Extremities: No lower extremity edema bilaterally,  pulses symmetric and intact bilaterally. No cyanosis or clubbing. Neurological: A&O x3, Strength is normal Skin: Warm, dry and intact. No rashes or erythema. Psychiatric: Normal mood and affect. speech and behavior is normal. Cognition and memory are normal.     Assessment & Plan:  Darcelle was seen today for new patient (initial visit).  Diagnoses  and all orders for this visit:  Screening for diabetes mellitus -     HgB A1c 5.6 discussed Prediabetes 5.7-6.4 . Start monitoring carbs and exercising   Encounter to establish care Establish care  Depression with anxiety Flowsheet Row Office Visit from 08/21/2021 in [facility]  PHQ-9 Total Score 19      Refer to CSW   Migraine with aura and without status migrainosus, not intractable Signs can be individualized with throbbing pain on any area of the head.  They may come with an aura-dizziness,  nausea, sensitive to light sound or smell.  Triggers can be caused by drinking alcohol smoking or certain medications, eating and drinking certain products. This condition may be triggered or caused by: Caffeine, aged cheese, and chocolate.   Class 2 severe obesity due to excess calories with serious comorbidity in adult, unspecified BMI (HCC) Obesity is 30-39 indicating an excess in caloric intake or underlining conditions. This may lead to other co-morbidities. Lifestyle modifications of diet and exercise may reduce obesity.    Right lower quadrant abdominal pain R/O UTI positive for Leuks sent out for culture      Outpatient Encounter Medications as of 08/21/2021  Medication Sig   ibuprofen (ADVIL) 600 MG tablet Take 1 tablet (600 mg total) by mouth every 8 (eight) hours as needed.   Prenatal Vit-Fe Fumarate-FA (PRENATAL PO) Take by mouth.   [DISCONTINUED] azelastine (OPTIVAR) 0.05 % ophthalmic solution Place 1 drop into both eyes 2 (two) times daily.   [DISCONTINUED] cetirizine (ZYRTEC ALLERGY) 10 MG tablet Take 1 tablet (10 mg total) by mouth daily.   [DISCONTINUED] pantoprazole (PROTONIX) 40 MG tablet Take 1 tablet (40 mg total) by mouth daily.   No facility-administered encounter medications on file as of 08/21/2021.    Follow-up: Return for CSW.   Grayce Sessions, NP

## 2021-08-26 LAB — URINE CULTURE

## 2021-08-31 ENCOUNTER — Other Ambulatory Visit (INDEPENDENT_AMBULATORY_CARE_PROVIDER_SITE_OTHER): Payer: Self-pay | Admitting: Primary Care

## 2021-08-31 MED ORDER — AMOXICILLIN-POT CLAVULANATE 875-125 MG PO TABS: 1.0000 | ORAL_TABLET | Freq: Two times a day (BID) | ORAL | 0 refills | Status: DC

## 2021-08-31 MED ORDER — FLUCONAZOLE 150 MG PO TABS: 150.0000 mg | ORAL_TABLET | Freq: Once | ORAL | 1 refills | Status: AC

## 2021-09-10 ENCOUNTER — Ambulatory Visit (INDEPENDENT_AMBULATORY_CARE_PROVIDER_SITE_OTHER): Payer: Medicaid Other | Admitting: Primary Care

## 2021-09-10 ENCOUNTER — Institutional Professional Consult (permissible substitution) (INDEPENDENT_AMBULATORY_CARE_PROVIDER_SITE_OTHER): Payer: Medicaid Other | Admitting: Clinical

## 2021-09-10 ENCOUNTER — Encounter (INDEPENDENT_AMBULATORY_CARE_PROVIDER_SITE_OTHER): Payer: Self-pay

## 2021-09-10 ENCOUNTER — Other Ambulatory Visit: Payer: Self-pay

## 2021-09-10 ENCOUNTER — Encounter (INDEPENDENT_AMBULATORY_CARE_PROVIDER_SITE_OTHER): Payer: Self-pay | Admitting: Primary Care

## 2021-09-10 VITALS — BP 112/69 | HR 73 | Temp 97.8°F | Ht 63.5 in | Wt 183.0 lb

## 2021-09-10 DIAGNOSIS — E66812 Obesity, class 2: Secondary | ICD-10-CM

## 2021-09-10 DIAGNOSIS — N921 Excessive and frequent menstruation with irregular cycle: Secondary | ICD-10-CM | POA: Diagnosis not present

## 2021-09-10 DIAGNOSIS — M25511 Pain in right shoulder: Secondary | ICD-10-CM | POA: Diagnosis not present

## 2021-09-10 DIAGNOSIS — H5789 Other specified disorders of eye and adnexa: Secondary | ICD-10-CM | POA: Diagnosis not present

## 2021-09-10 DIAGNOSIS — Z6831 Body mass index (BMI) 31.0-31.9, adult: Secondary | ICD-10-CM

## 2021-09-10 DIAGNOSIS — G8929 Other chronic pain: Secondary | ICD-10-CM | POA: Diagnosis not present

## 2021-09-10 DIAGNOSIS — Z13220 Encounter for screening for lipoid disorders: Secondary | ICD-10-CM

## 2021-09-10 MED ORDER — DICLOFENAC SODIUM 1 % EX GEL: 4.0000 g | Freq: Four times a day (QID) | CUTANEOUS | 1 refills | Status: DC

## 2021-09-10 NOTE — Progress Notes (Signed)
Acute Office Visit  Subjective:    Patient ID: Sandra Sexton, female    DOB: 1996/05/10, 26 y.o.   MRN: [id number]  Chief Complaint  Patient presents with   itchy eyes   Shoulder Pain    Right shoulder    Shoulder Pain   Sandra Sexton is a 26 year old Swahili  female ( interpreter Windle Guard [phone]) in today for Right shoulder pain for over 2 years but has become more severe rates pain 8/10 aggravating factors lifting kids or chores at home. Alleviating factors none.  She is also c/o eyes itching for 3 years . She has tried several things for relief with no relief.   Past Medical History:  Diagnosis Date   H/O malaria 2015   [place]    Past Surgical History:  Procedure Laterality Date   NO PAST SURGERIES      No family history on file.  Social History   Socioeconomic History   Marital status: Married    Spouse name: Not on file   Number of children: Not on file   Years of education: Not on file   Highest education level: Not on file  Occupational History   Not on file  Tobacco Use   Smoking status: Never   Smokeless tobacco: Never  Vaping Use   Vaping Use: Never used  Substance and Sexual Activity   Alcohol use: No   Drug use: No   Sexual activity: Yes  Other Topics Concern   Not on file  Social History Narrative   Not on file   Social Determinants of Health   Financial Resource Strain: Not on file  Food Insecurity: Not on file  Transportation Needs: Not on file  Physical Activity: Not on file  Stress: Not on file  Social Connections: Not on file  Intimate Partner Violence: Not on file    Outpatient Medications Prior to Visit  Medication Sig Dispense Refill   ibuprofen (ADVIL) 600 MG tablet Take 1 tablet (600 mg total) by mouth every 8 (eight) hours as needed. 30 tablet 0   Prenatal Vit-Fe Fumarate-FA (PRENATAL PO) Take by mouth.     amoxicillin-clavulanate (AUGMENTIN) 875-125 MG tablet Take 1 tablet by mouth 2 (two)  times daily. (Patient not taking: Reported on 09/10/2021) 20 tablet 0   No facility-administered medications prior to visit.    No Known Allergies Comprehensive ROS Pertinent positive and negative noted in HPI       Objective:    Physical Exam Vitals reviewed.  Constitutional:      Appearance: She is obese.  HENT:     Head: Normocephalic.  Eyes:     Extraocular Movements: Extraocular movements intact.  Cardiovascular:     Rate and Rhythm: Normal rate and regular rhythm.  Pulmonary:     Effort: Pulmonary effort is normal.     Breath sounds: Normal breath sounds.  Abdominal:     General: Bowel sounds are normal. There is distension.     Palpations: Abdomen is soft.  Musculoskeletal:        General: Normal range of motion.     Cervical back: Normal range of motion.  Skin:    General: Skin is warm and dry.  Neurological:     Mental Status: She is alert and oriented to person, place, and time.  Psychiatric:        Mood and Affect: Mood normal.        Thought Content: Thought content  normal.        Judgment: Judgment normal.    BP 112/69 (BP Location: Right Arm, Patient Position: Sitting, Cuff Size: Large)    Pulse 73    Temp 97.8 F (36.6 C) (Oral)    Ht 5' 3.5" (1.613 m)    Wt 183 lb (83 kg)    LMP 08/21/2021    SpO2 100%    Breastfeeding Yes    BMI 31.91 kg/m  Wt Readings from Last 3 Encounters:  09/10/21 183 lb (83 kg)  08/21/21 185 lb 12.8 oz (84.3 kg)  10/06/20 169 lb (76.7 kg)    Health Maintenance Due  Topic Date Due   COVID-19 Vaccine (1) Never done    There are no preventive care reminders to display for this patient.   Lab Results  Component Value Date   TSH 1.16 10/21/2016   Lab Results  Component Value Date   WBC 4.4 08/23/2020   HGB 10.9 (L) 08/23/2020   HCT 34.8 (L) 08/23/2020   MCV 92.6 08/23/2020   PLT 130 (L) 08/23/2020   Lab Results  Component Value Date   NA 136 03/25/2020   K 4.6 03/25/2020   CO2 21 03/25/2020   GLUCOSE 67  03/25/2020   BUN 5 (L) 03/25/2020   CREATININE 0.45 (L) 03/25/2020   BILITOT <0.2 03/25/2020   ALKPHOS 61 03/25/2020   AST 12 03/25/2020   ALT 7 03/25/2020   PROT 7.0 03/25/2020   ALBUMIN 4.0 03/25/2020   CALCIUM 8.7 03/25/2020   ANIONGAP 7 06/08/2017   Lab Results  Component Value Date   CHOL 137 10/21/2016   Lab Results  Component Value Date   HDL 50 (L) 10/21/2016   Lab Results  Component Value Date   LDLCALC 73 10/21/2016   Lab Results  Component Value Date   TRIG 69 10/21/2016   Lab Results  Component Value Date   CHOLHDL 2.7 10/21/2016   Lab Results  Component Value Date   HGBA1C 5.6 08/21/2021       Assessment & Plan:  Meisha was seen today for itchy eyes and shoulder pain.  Diagnoses and all orders for this visit:  Chronic right shoulder pain -     AMB referral to orthopedics  Irritation of both eyes Information provided for optometrist to schedule eye exam   Lipid screening -     Lipid panel; Future  Class 2 severe obesity due to excess calories with serious comorbidity in adult, unspecified BMI ([place]) Obesity is 30-39 indicating an excess in caloric intake or underlining conditions. This may lead to other co-morbidities. Lifestyle modifications of diet and exercise may reduce obesity.   -     Lipid panel; Future -     CMP14+EGFR; Future  Menorrhagia with irregular cycle  heavy  prolonged vaginal bleeding with menstrual cycles.  .  Menstrual cycles is irregular.  -     CBC with Differential/Platelet; Future  Other orders -     diclofenac Sodium (VOLTAREN) 1 % GEL; Apply 4 g topically 4 (four) times daily.   This note has been created with Surveyor, quantity. Any transcriptional errors are unintentional.   Kerin Perna, NP 09/13/2021, 3:00 PM

## 2021-09-10 NOTE — Progress Notes (Signed)
Right shoulder pain for over 2 years but has become more severe recently  Eyes itching for 3 years states she has tried several things for relief with no relief

## 2021-09-21 ENCOUNTER — Ambulatory Visit (INDEPENDENT_AMBULATORY_CARE_PROVIDER_SITE_OTHER): Payer: Medicaid Other | Admitting: Physician Assistant

## 2021-09-21 ENCOUNTER — Encounter: Payer: Self-pay | Admitting: Physician Assistant

## 2021-09-21 ENCOUNTER — Ambulatory Visit (INDEPENDENT_AMBULATORY_CARE_PROVIDER_SITE_OTHER): Payer: Medicaid Other

## 2021-09-21 ENCOUNTER — Other Ambulatory Visit: Payer: Self-pay

## 2021-09-21 DIAGNOSIS — M25511 Pain in right shoulder: Secondary | ICD-10-CM

## 2021-09-21 DIAGNOSIS — M542 Cervicalgia: Secondary | ICD-10-CM

## 2021-09-21 DIAGNOSIS — G8929 Other chronic pain: Secondary | ICD-10-CM | POA: Diagnosis not present

## 2021-09-21 DIAGNOSIS — M5412 Radiculopathy, cervical region: Secondary | ICD-10-CM

## 2021-09-21 MED ORDER — CYCLOBENZAPRINE HCL 10 MG PO TABS: 10.0000 mg | ORAL_TABLET | Freq: Every day | ORAL | 0 refills | Status: DC

## 2021-09-21 NOTE — Addendum Note (Signed)
Addended by: Barbette Or on: 09/21/2021 11:03 AM   Modules accepted: Orders

## 2021-09-21 NOTE — Progress Notes (Signed)
Office Visit Note   Patient: Sandra Sexton           Date of Birth: 09-17-1995           MRN: [id number] Visit Date: 09/21/2021              Requested by: Grayce Sessions, NP [address] [place],  [place] [address] PCP: Grayce Sessions, NP   Assessment & Plan: Visit Diagnoses:  1. Neck pain   2. Chronic right shoulder pain   3. Radiculopathy, cervical region     Plan: Explained to the patient using interpreter today would not recommend placing her on a Medrol Dosepak due to the fact that she has an ongoing UTI and is on Augmentin.  Therefore we will place her on Flexeril for her to take it mainly at night.  We will send her to physical therapy for her neck to work on range of motion strengthening, home exercise program and modalities.  We will see her back in 6 weeks to see what type of response she had to physical therapy.  She will continue the ibuprofen taking at least 600 mg twice daily for the next 2 weeks and she is to take the ibuprofen with food.  Questions were encouraged and answered at length today using interpreter.  Time spent with patient was greater than 30 minutes mostly due to the language barrier.  Follow-Up Instructions: Return in about 6 weeks (around 11/02/2021).   Orders:  Orders Placed This Encounter  Procedures   XR Shoulder Right   XR Cervical Spine 2 or 3 views   Meds ordered this encounter  Medications   cyclobenzaprine (FLEXERIL) 10 MG tablet    Sig: Take 1 tablet (10 mg total) by mouth at bedtime.    Dispense:  30 tablet    Refill:  0      Procedures: No procedures performed   Clinical Data: No additional findings.   Subjective: Chief Complaint  Patient presents with   Right Shoulder - Pain    HPI  Sandra Sexton is a 26 year old female with right shoulder pain has been ongoing for 2 years.  No known injury.  She states that she has pain in her right neck region that radiates down at times to her wrist.  She  describes the pain as a burning pain mostly between her neck and her shoulder but again radiates down into the arm with some numbness and tingling pain.  She has tried Voltaren gel and ibuprofen which helps some.  She has had no injections no therapy.  She is nondiabetic.  She is currently on Augmentin and states that she has a urinary tract infection.  She speaks Swahili and presents today with an interpreter.  She is nondiabetic.  Review of Systems Positive for fevers chills.  Otherwise see HPI.  Objective: Vital Signs: There were no vitals taken for this visit.  Physical Exam Constitutional:      Appearance: She is not ill-appearing or diaphoretic.  Cardiovascular:     Pulses: Normal pulses.  Pulmonary:     Effort: Pulmonary effort is normal.  Neurological:     Mental Status: She is alert and oriented to person, place, and time.  Psychiatric:        Mood and Affect: Mood normal.    Ortho Exam Bilateral hands subjective decrease sensation throughout the right hand to light touch.  Full motor bilateral hands.  Upper extremity strength  test 5 out of 5 throughout however she does have significant pain with triceps testing on the right and this has to be repeated multiple times to gain sense of her strength.  She has 5/5 strength with external and internal rotation against resistance bilateral shoulders.  Impingement testing is negative bilaterally.  Empty can test is negative bilaterally. Cervical spine good range of motion of the cervical spine.  She has pain with forward flexion.  Positive Spurling's.  Tenderness medial border of the right scapula.  Specialty Comments:  No specialty comments available.  Imaging: XR Cervical Spine 2 or 3 views  Result Date: 09/21/2021 Cervical spine 2 views: No acute fractures.  Disc base overall well-maintained.  Loss of lordotic curvature.  No spondylolisthesis.  No significant arthropathy.  XR Shoulder Right  Result Date: 09/21/2021 Right  shoulder 3 views: Shoulders well located.  No glenohumeral arthropathy.  No acute fractures.  No acute findings or bony abnormalities.    PMFS History: Patient Active Problem List   Diagnosis Date Noted   IUD (intrauterine device) in place 10/06/2020   Language barrier affecting health care 05/20/2015   Past Medical History:  Diagnosis Date   H/O malaria 2015   [place]    History reviewed. No pertinent family history.  Past Surgical History:  Procedure Laterality Date   NO PAST SURGERIES     Social History   Occupational History   Not on file  Tobacco Use   Smoking status: Never   Smokeless tobacco: Never  Vaping Use   Vaping Use: Never used  Substance and Sexual Activity   Alcohol use: No   Drug use: No   Sexual activity: Yes

## 2021-10-01 ENCOUNTER — Ambulatory Visit (INDEPENDENT_AMBULATORY_CARE_PROVIDER_SITE_OTHER): Payer: Medicaid Other | Admitting: Clinical

## 2021-10-01 ENCOUNTER — Other Ambulatory Visit: Payer: Self-pay

## 2021-10-01 DIAGNOSIS — F4322 Adjustment disorder with anxiety: Secondary | ICD-10-CM | POA: Diagnosis not present

## 2021-10-01 NOTE — Patient Instructions (Signed)
Please call [phone] if you need to schedule another appointment. ?

## 2021-10-01 NOTE — BH Specialist Note (Signed)
Integrated Behavioral Health Initial In-Person Visit ? ?MRN: [id number] ?Name: Sandra Sexton ? ?Number of Integrated Behavioral Health Clinician visits: 1- Initial Visit ? ?Session Start time: 1015 ?   ?Session End time: 1055 ? ?Total time in minutes: 40 ? ? ?Types of Service: Individual psychotherapy ? ?Interpretor:Yes.   Interpretor Name and Language: Swahili Gaspar Garbe [phone]) ? ? Warm Hand Off Completed. ?  ? ?  ? ? ?Subjective: ?Sandra Sexton is a 26 y.o. female accompanied by  self ?Patient was referred by Gwinda Passe, NP for depression and anxiety. ?Patient reports the following symptoms/concerns: Reports trouble concentrating and worrying. Reports that during her visit with PCP she was experiencing depression and anxiety related to her health. Reports that she was experiencing problems with her eyes. Reports that she still experiences problems with eyes but she is established with an optometrist now. Reports that she worries about her eyes at times due to them becoming watery.  ?Duration of problem: 2 months; Severity of problem: mild ? ?Objective: ?Mood: Anxious and Affect: Appropriate ?Risk of harm to self or others: No plan to harm self or others ? ?Life Context: ?Family and Social: Pt has four children and receives support from her spouse. ?School/Work: Pt is beginning a new job. ?Self-Care: No reports on self-care. ?Life Changes: Pt has experienced problems with her eyes. ? ?Patient and/or Family's Strengths/Protective Factors: ?Concrete supports in place (healthy food, safe environments, etc.) and Sense of purpose ? ?Goals Addressed: ?Patient will: ?Reduce symptoms of: stress ?Increase knowledge and/or ability of: coping skills  ?Demonstrate ability to: Increase healthy adjustment to current life circumstances ? ?Progress towards Goals: ?Ongoing ? ?Interventions: ?Interventions utilized: Supportive Counseling and Psychoeducation and/or Health Education  ?Standardized Assessments completed:  GAD-7 and PHQ 9 ?GAD 7 : Generalized Anxiety Score 10/01/2021 08/21/2020 08/14/2020 06/24/2020  ?Nervous, Anxious, on Edge 1 0 0 0  ?Control/stop worrying 0 0 0 0  ?Worry too much - different things 2 0 0 0  ?Trouble relaxing 1 0 0 0  ?Restless 1 0 0 0  ?Easily annoyed or irritable 3 0 0 0  ?Afraid - awful might happen 2 0 0 0  ?Total GAD 7 Score 10 0 0 0  ? ?  ?Depression screen [facility] 2/9 10/01/2021 08/21/2021 08/21/2020 08/14/2020 06/24/2020  ?Decreased Interest 0 3 0 0 0  ?Down, Depressed, Hopeless 0 3 0 0 0  ?PHQ - 2 Score 0 6 0 0 0  ?Altered sleeping 0 1 0 0 0  ?Tired, decreased energy 1 3 0 0 0  ?Change in appetite 1 3 0 0 0  ?Feeling bad or failure about yourself  0 3 0 0 0  ?Trouble concentrating 2 3 0 0 0  ?Moving slowly or fidgety/restless 1 0 0 0 0  ?Suicidal thoughts 0 0 0 0 0  ?PHQ-9 Score 5 19 0 0 0  ?Difficult doing work/chores - Very difficult - - -  ?Some recent data might be hidden  ?  ?Patient and/or Family Response: Pt receptive to psychoeducation on stress and anxiety. Pt does not believe she currently needs counseling due to her health improving. ? ?Patient Centered Plan: ?Patient is on the following Treatment Plan(s):  Stress ? ?Assessment: ?Denies SI/HI. Patient currently experiencing stress related to her health. Pt has been experiencing problems with her eyes but is noticing an improvement since seeing an optometrist. Pt appears to worry about her health. ?  ?Patient may benefit from brief interventions. LCSW provided psychoeducation on anxiety and depression.  LCSW provided support and encouraged pt to continue seeing optometrist. LCSW provided psychoeducation on the benefits of counseling. LCSW encouraged pt to fu if needed. ? ?Plan: ?Follow up with behavioral health clinician on : PRN ?Behavioral recommendations: Schedule an appt with LCSW if needed. ?Referral(s): Integrated [facility] [facility] (In Clinic) ?"From scale of 1-10, how likely are you to follow plan?": 10 ? ?Kiamesha Samet C Jevante Hollibaugh,  LCSW ? ? ? ? ? ? ? ? ?

## 2021-10-21 ENCOUNTER — Other Ambulatory Visit: Payer: Self-pay

## 2021-10-21 ENCOUNTER — Ambulatory Visit (INDEPENDENT_AMBULATORY_CARE_PROVIDER_SITE_OTHER): Payer: Medicaid Other | Admitting: Primary Care

## 2021-10-21 ENCOUNTER — Encounter (INDEPENDENT_AMBULATORY_CARE_PROVIDER_SITE_OTHER): Payer: Self-pay | Admitting: Primary Care

## 2021-10-21 VITALS — BP 122/81 | HR 84 | Temp 98.6°F | Ht 63.5 in | Wt 184.6 lb

## 2021-10-21 DIAGNOSIS — Z0000 Encounter for general adult medical examination without abnormal findings: Secondary | ICD-10-CM

## 2021-10-21 DIAGNOSIS — N921 Excessive and frequent menstruation with irregular cycle: Secondary | ICD-10-CM

## 2021-10-21 DIAGNOSIS — Z13220 Encounter for screening for lipoid disorders: Secondary | ICD-10-CM | POA: Diagnosis not present

## 2021-10-21 DIAGNOSIS — Z789 Other specified health status: Secondary | ICD-10-CM

## 2021-10-21 NOTE — Progress Notes (Signed)
?Sandra Sexton ? ?Sandra Sexton, is a 26 y.o. female ? ?BVQ:[id number] ? ?CMK:[id number] ? ?DOB - 13-Dec-1995 ? ?Chief Complaint  ?Patient presents with  ? check up  ?  No concerns. Came in for labs   ?    ? ?Subjective:  ? ?Sandra Sexton is a 26 y.o. Swahili female she is still breast feeding. She is here today for a follow up visit.  She presents for blood work .Patient has No headache, No chest pain, No abdominal pain - No Nausea, No new weakness tingling or numbness, No Cough -shortness of breath. Explained to patient her eating healthy diet and not skipping meal to monitor weight or loss this is not as important as a healthy baby and able to produce milk.  ? ?No problems updated. ? ?No Known Allergies ? ?Past Medical History:  ?Diagnosis Date  ? H/O malaria 2015  ? [place]  ? ? ?Current Outpatient Medications on File Prior to Visit  ?Medication Sig Dispense Refill  ? cyclobenzaprine (FLEXERIL) 10 MG tablet Take 1 tablet (10 mg total) by mouth at bedtime. 30 tablet 0  ? diclofenac Sodium (VOLTAREN) 1 % GEL Apply 4 g topically 4 (four) times daily. 150 g 1  ? ibuprofen (ADVIL) 600 MG tablet Take 1 tablet (600 mg total) by mouth every 8 (eight) hours as needed. 30 tablet 0  ? ?No current facility-administered medications on file prior to visit.  ? ? ?Objective:  ? ?Vitals:  ? 10/21/21 1024  ?BP: 122/81  ?Pulse: 84  ?Temp: 98.6 ?F (37 ?C)  ?TempSrc: Oral  ?SpO2: 100%  ?Weight: 184 lb 9.6 oz (83.7 kg)  ?Height: 5' 3.5" (1.613 m)  ? ? ?Exam ?General appearance : Awake, alert, not in any distress. Speech Clear. Not toxic looking ?HEENT: Atraumatic and Normocephalic, pupils equally reactive to light and accomodation ?Neck: Supple, no JVD. No cervical lymphadenopathy.  ?Chest: Good air entry bilaterally, no added sounds  ?CVS: S1 S2 regular, no murmurs.  ?Abdomen: Bowel sounds present, Non tender and not distended with no gaurding, rigidity or rebound. ?Extremities: B/L Lower  Ext shows no edema, both legs are warm to touch ?Neurology: Awake alert, and oriented X 3, Non focal ?Skin: No Rash ? ?Data Review ?Lab Results  ?Component Value Date  ? HGBA1C 5.6 08/21/2021  ? HGBA1C 5.1 03/25/2020  ? HGBA1C 5.0 12/08/2016  ? ? ?Assessment & Plan  ?Dannell was seen today for check up. ? ?Diagnoses and all orders for this visit: ? ?Healthcare maintenance ?Labs ? ?Breastfeeding (infant) ?Labs  ? Sandra Sexton was seen today for check up. ? ?Lipid screening ?-     Lipid panel ? ?Class 2 severe obesity due to excess calories with serious comorbidity in adult, unspecified BMI ([place]) ?-     CMP14+EGFR ? ?Menorrhagia with irregular cycle ?-     CBC with Differential/Platelet ? ?  ? ?Patient have been counseled extensively about nutrition and exercise. Other issues discussed during this visit include: low cholesterol diet, weight control and daily exercise, foot care, annual eye examinations at Ophthalmology, importance of adherence with medications and regular follow-up. We also discussed long term complications of uncontrolled diabetes and hypertension.  ? ? ?The patient was given clear instructions to go to ER or return to medical center if symptoms don't improve, worsen or new problems develop. The patient verbalized understanding. The patient was told to call to get lab results if they haven't heard anything in the next week.  ? ?  This note has been created with Dragon speech recognition software and smart phrase technology. Any transcriptional errors are unintentional.  ? ?Michelle P Edwards, NP ?10/25/2021, 7:34 PM  ?

## 2021-10-22 LAB — CBC WITH DIFFERENTIAL/PLATELET
Basophils Absolute: 0 10*3/uL (ref 0.0–0.2)
Basos: 1 %
EOS (ABSOLUTE): 0 10*3/uL (ref 0.0–0.4)
Eos: 1 %
Hematocrit: 38.9 % (ref 34.0–46.6)
Hemoglobin: 12.7 g/dL (ref 11.1–15.9)
Immature Grans (Abs): 0 10*3/uL (ref 0.0–0.1)
Immature Granulocytes: 0 %
Lymphocytes Absolute: 1.6 10*3/uL (ref 0.7–3.1)
Lymphs: 34 %
MCH: 29.7 pg (ref 26.6–33.0)
MCHC: 32.6 g/dL (ref 31.5–35.7)
MCV: 91 fL (ref 79–97)
Monocytes Absolute: 0.5 10*3/uL (ref 0.1–0.9)
Monocytes: 10 %
Neutrophils Absolute: 2.6 10*3/uL (ref 1.4–7.0)
Neutrophils: 54 %
Platelets: 167 10*3/uL (ref 150–450)
RBC: 4.27 x10E6/uL (ref 3.77–5.28)
RDW: 12 % (ref 11.7–15.4)
WBC: 4.7 10*3/uL (ref 3.4–10.8)

## 2021-10-22 LAB — CMP14+EGFR
ALT: 12 IU/L (ref 0–32)
AST: 17 IU/L (ref 0–40)
Albumin/Globulin Ratio: 1.4 (ref 1.2–2.2)
Albumin: 4.4 g/dL (ref 3.9–5.0)
Alkaline Phosphatase: 98 IU/L (ref 44–121)
BUN/Creatinine Ratio: 9 (ref 9–23)
BUN: 6 mg/dL (ref 6–20)
Bilirubin Total: <0.2 mg/dL (ref 0.0–1.2)
CO2: 23 mmol/L (ref 20–29)
Calcium: 8.8 mg/dL (ref 8.7–10.2)
Chloride: 104 mmol/L (ref 96–106)
Creatinine, Ser: 0.66 mg/dL (ref 0.57–1.00)
Globulin, Total: 3.2 g/dL (ref 1.5–4.5)
Glucose: 85 mg/dL (ref 70–99)
Potassium: 4.6 mmol/L (ref 3.5–5.2)
Sodium: 139 mmol/L (ref 134–144)
Total Protein: 7.6 g/dL (ref 6.0–8.5)
eGFR: 123 mL/min/{1.73_m2} (ref >59)

## 2021-10-22 LAB — LIPID PANEL
Chol/HDL Ratio: 3 ratio (ref 0.0–4.4)
Cholesterol, Total: 159 mg/dL (ref 100–199)
HDL: 53 mg/dL (ref >39)
LDL Chol Calc (NIH): 93 mg/dL (ref 0–99)
Triglycerides: 66 mg/dL (ref 0–149)
VLDL Cholesterol Cal: 13 mg/dL (ref 5–40)

## 2021-10-24 ENCOUNTER — Other Ambulatory Visit: Payer: Self-pay

## 2021-10-24 ENCOUNTER — Emergency Department (HOSPITAL_COMMUNITY)
Admission: EM | Admit: 2021-10-24 | Discharge: 2021-10-24 | Disposition: A | Discharge status: Home / Self Care | Payer: Medicaid Other | Attending: Emergency Medicine | Admitting: Emergency Medicine

## 2021-10-24 ENCOUNTER — Encounter (HOSPITAL_COMMUNITY): Payer: Self-pay | Admitting: Emergency Medicine

## 2021-10-24 DIAGNOSIS — Z20822 Contact with and (suspected) exposure to covid-19: Secondary | ICD-10-CM | POA: Insufficient documentation

## 2021-10-24 DIAGNOSIS — J020 Streptococcal pharyngitis: Secondary | ICD-10-CM | POA: Diagnosis not present

## 2021-10-24 DIAGNOSIS — R000 Tachycardia, unspecified: Secondary | ICD-10-CM | POA: Diagnosis not present

## 2021-10-24 DIAGNOSIS — J029 Acute pharyngitis, unspecified: Secondary | ICD-10-CM | POA: Diagnosis present

## 2021-10-24 LAB — RESP PANEL BY RT-PCR (FLU A&B, COVID) ARPGX2
Influenza A by PCR: NEGATIVE
Influenza B by PCR: NEGATIVE
SARS Coronavirus 2 by RT PCR: NEGATIVE

## 2021-10-24 LAB — GROUP A STREP BY PCR: Group A Strep by PCR: DETECTED — AB

## 2021-10-24 MED ORDER — AMOXICILLIN 500 MG PO CAPS: 500.0000 mg | ORAL_CAPSULE | Freq: Two times a day (BID) | ORAL | 0 refills | Status: AC

## 2021-10-24 MED ORDER — IBUPROFEN 400 MG PO TABS
600.0000 mg | ORAL_TABLET | Freq: Once | ORAL | Status: AC
  Administered 2021-10-24: 600 mg via ORAL
  Filled 2021-10-24: qty 1

## 2021-10-24 NOTE — ED Notes (Signed)
DC instructions reviewed with pt. PT verbalized understanding. PT DC °

## 2021-10-24 NOTE — Discharge Instructions (Addendum)
You were seen in the emergency department today for sore throat. ? ?You tested positive for strep.  This is a bacterial infection of your throat.  We treat with antibiotics.  It is important you take the entire course of medication.  You will take this twice per day for 10 days. ? ?You can take ibuprofen as needed for pain or fever. ? ?Continue to monitor how you're doing and return to the ER for new or worsening symptoms.  ?

## 2021-10-24 NOTE — ED Provider Notes (Signed)
?[facility] EMERGENCY DEPARTMENT ?Provider Note ? ? ?CSN: [id number] ?Arrival date & time: 10/24/21  1038 ? ?  ? ?History ? ?Chief Complaint  ?Patient presents with  ? Sore Throat  ? ? ?Sandra Sexton is a 26 y.o. female who presents the emergency department complaining of sore throat and fever starting yesterday.  She took some ibuprofen last night before going to bed, and states she had no relief.  She did not check her temperature but felt feverish.  No cough or difficulty breathing. ? ? ?Sore Throat ?Pertinent negatives include no chest pain and no shortness of breath.  ? ?  ? ?Home Medications ?Prior to Admission medications   ?Medication Sig Start Date End Date Taking? Authorizing Provider  ?amoxicillin (AMOXIL) 500 MG capsule Take 1 capsule (500 mg total) by mouth 2 (two) times daily for 10 days. 10/24/21 11/03/21 Yes Kiante Petrovich T, PA-C  ?cyclobenzaprine (FLEXERIL) 10 MG tablet Take 1 tablet (10 mg total) by mouth at bedtime. 09/21/21   Kirtland Bouchard, PA-C  ?diclofenac Sodium (VOLTAREN) 1 % GEL Apply 4 g topically 4 (four) times daily. 09/10/21   Grayce Sessions, NP  ?ibuprofen (ADVIL) 600 MG tablet Take 1 tablet (600 mg total) by mouth every 8 (eight) hours as needed. 08/21/21   Grayce Sessions, NP  ?   ? ?Allergies    ?Patient has no known allergies.   ? ?Review of Systems   ?Review of Systems  ?Constitutional:  Positive for fever.  ?HENT:  Positive for sore throat. Negative for trouble swallowing and voice change.   ?Respiratory:  Negative for cough and shortness of breath.   ?Cardiovascular:  Negative for chest pain.  ?All other systems reviewed and are negative. ? ?Physical Exam ?Updated Vital Signs ?BP 121/78   Pulse (!) 119   Temp 99.5 ?F (37.5 ?C) (Oral)   Resp 14   LMP 09/25/2021   SpO2 100%  ?Physical Exam ?Vitals and nursing note reviewed.  ?Constitutional:   ?   Appearance: Normal appearance.  ?HENT:  ?   Head: Normocephalic and atraumatic.  ?   Mouth/Throat:  ?    Lips: Pink.  ?   Mouth: Mucous membranes are moist.  ?   Pharynx: Uvula midline. Posterior oropharyngeal erythema present. No oropharyngeal exudate.  ?   Tonsils: No tonsillar exudate or tonsillar abscesses. 1+ on the right. 1+ on the left.  ?Eyes:  ?   Conjunctiva/sclera: Conjunctivae normal.  ?Cardiovascular:  ?   Rate and Rhythm: Normal rate and regular rhythm.  ?Pulmonary:  ?   Effort: Pulmonary effort is normal. No respiratory distress.  ?   Breath sounds: Normal breath sounds.  ?Abdominal:  ?   General: There is no distension.  ?   Palpations: Abdomen is soft.  ?   Tenderness: There is no abdominal tenderness.  ?Skin: ?   General: Skin is warm and dry.  ?Neurological:  ?   General: No focal deficit present.  ?   Mental Status: She is alert.  ? ? ?ED Results / Procedures / Treatments   ?Labs ?(all labs ordered are listed, but only abnormal results are displayed) ?Labs Reviewed  ?GROUP A STREP BY PCR - Abnormal; Notable for the following components:  ?    Result Value  ? Group A Strep by PCR DETECTED (*)   ? All other components within normal limits  ?RESP PANEL BY RT-PCR (FLU A&B, COVID) [id number]  ? ? ?EKG ?None ? ?Radiology ?  No results found. ? ?Procedures ?Procedures  ? ? ?Medications Ordered in ED ?Medications - No data to display ? ?ED Course/ Medical Decision Making/ A&P ?  ?                        ?Medical Decision Making ?Risk ?Prescription drug management. ? ? ?This patient is a 26 year old female who presents to the ED for concern of sore throat.  ? ?Differential diagnoses prior to evaluation: ?The emergent differential diagnosis includes, but is not limited to,  Viral pharyngitis, strep pharyngitis, dental caries/abscess, esophagitis, sinusitis, post nasal drip, reflux, angioedema, RTA/PTA, Ludwig's angina.  ? ?This is not an exhaustive differential.  ? ?Past Medical History / Co-morbidities: ?Malaria in 2015 ? ?Physical Exam: ?Physical exam performed. The pertinent findings include: Patient is  afebrile, tachycardic to 119, not hypoxic, no acute distress.  Oropharyngeal erythema, with bilateral 1+ tonsillar swelling.  No exudate or abscess. ? ?Lab Tests/Imaging studies: ?I Ordered, and personally interpreted labs/imaging including respiratory panel and group A strep.  The pertinent results include:  Negative COVID and flu.  Positive group A strep..  ?  ?Disposition: ?After consideration of the diagnostic results and the patients response to treatment, I feel that patient is not requiring admission or inpatient treatment further symptoms.  We will treat with antibiotics for strep pharyngitis.  Discussed reasons to return to the emergency department, and the patient is agreeable to the plan.  ? ?Final Clinical Impression(s) / ED Diagnoses ?Final diagnoses:  ?Strep pharyngitis  ?Sore throat  ? ? ?Rx / DC Orders ?ED Discharge Orders   ? ?      Ordered  ?  amoxicillin (AMOXIL) 500 MG capsule  2 times daily       ? 10/24/21 1331  ? ?  ?  ? ?  ? ?Portions of this report may have been transcribed using voice recognition software. Every effort was made to ensure accuracy; however, inadvertent computerized transcription errors may be present. ? ?  ?Su Monks, PA-C ?10/24/21 1332 ? ?  ?Rozelle Logan, DO ?10/25/21 1130 ? ?

## 2021-10-24 NOTE — ED Triage Notes (Signed)
C/o sore throat and fever since Friday. ?

## 2021-10-29 ENCOUNTER — Ambulatory Visit (INDEPENDENT_AMBULATORY_CARE_PROVIDER_SITE_OTHER): Payer: Medicaid Other | Admitting: Physician Assistant

## 2021-10-29 ENCOUNTER — Ambulatory Visit (INDEPENDENT_AMBULATORY_CARE_PROVIDER_SITE_OTHER): Payer: Self-pay

## 2021-10-29 ENCOUNTER — Encounter: Payer: Self-pay | Admitting: Physician Assistant

## 2021-10-29 DIAGNOSIS — R6889 Other general symptoms and signs: Secondary | ICD-10-CM | POA: Diagnosis not present

## 2021-10-29 DIAGNOSIS — J302 Other seasonal allergic rhinitis: Secondary | ICD-10-CM | POA: Diagnosis not present

## 2021-10-29 MED ORDER — CETIRIZINE HCL 10 MG PO TABS: 10.0000 mg | ORAL_TABLET | Freq: Every day | ORAL | 11 refills | Status: DC

## 2021-10-29 MED ORDER — OLOPATADINE HCL 0.1 % OP SOLN: 1.0000 [drp] | Freq: Two times a day (BID) | OPHTHALMIC | 12 refills | Status: DC

## 2021-10-29 NOTE — Telephone Encounter (Signed)
?  Chief Complaint: allergies ?Symptoms: eyes itchy and watery ?Frequency: 4 days ?Pertinent Negatives: Patient denies runny nose, or cough ?Disposition: [] ED /[] Urgent Care (no appt availability in office) / [x] Appointment(In office/virtual)/ []  [place] [facility]/ [] Home Care/ [] Refused Recommended Disposition /[] [place] [facility]/ []  Follow-up with PCP ?Additional Notes: I advised pt's husband what they could purchase OTC to help with symptoms but she preferred to have an appt and medication be sent into pharmacy.  ? ?Summary: eye itchy  ? Pt called in stating she has been having itchy eyes and wanted to speak with a nurse, there were no available appt, Please advise  ?  ? ?Reason for Disposition ? Mild eye allergy ? ?Answer Assessment - Initial Assessment Questions ?1. SYMPTOM: "What's the main symptom you're concerned about?" (e.g., runny nose, stuffiness, sneezing, itching) ?    Itching watery eyes ?2. SEVERITY: "How bad is it?" "What does it keep you from doing?" (e.g., sleeping, working)  ?    Mild to moderate ?3. EYES: "Are the eyes also red, watery, and itchy?"  ?    yes ?5. TREATMENT: "What medicine are you using?" "What medicine worked best in the past?" ?    no ?6. OTHER SYMPTOMS: "Do you have any other symptoms?" (e.g., coughing, difficulty breathing, wheezing) ?    no ?7. PREGNANCY: "Is there any chance you are pregnant?" "When was your last menstrual period?" ?    no ? ?Protocols used: Nasal Allergies (Hay Fever)-A-AH, Eye - Allergy-A-AH ? ?

## 2021-10-29 NOTE — Progress Notes (Signed)
Medical Assistant used [employer] to contact patient.  ?Interpreter Name: Penni Bombard #: [id number] ?Patient verified DOB ?Patient reports having eye allergies beginning Sunday with redness, watering and waking up with her eyes crusted over and draining yellow fluid. ?Patient is taking the antibiotic at this time. ? ?

## 2021-10-29 NOTE — Progress Notes (Signed)
? ?Established Patient Office Visit ? ?Subjective:  ?Patient ID: Sandra Sexton, female    DOB: 05/17/1996  Age: 26 y.o. MRN: [id number] ? ?CC:  ?Chief Complaint  ?Patient presents with  ? Allergies  ? ? ?Virtual Visit via Telephone Note ? ?I connected with Sandra Sexton on 10/29/21 at  2:20 PM EDT by telephone and verified that I am speaking with the correct person using two identifiers. ? ?Location: ?Patient: Home ?Provider: Primary Care at [facility] ?  ?I discussed the limitations, risks, security and privacy concerns of performing an evaluation and management service by telephone and the availability of in person appointments. I also discussed with the patient that there may be a patient responsible charge related to this service. The patient expressed understanding and agreed to proceed. ? ? ?History of Present Illness: ? ?Sandra Sexton states that she has been having itchy watery eyes for the past 4 days.  States they are red when she wakes up in the morning. Some discharge in the morning. States that she has not tried anything for relief. ? ?States that she was treated for strep throat on October 24, 2021, states that she is still completing the amoxicillin.  States that her sore throat has improved greatly.  Is eating and drinking okay. ? ?Does endorse history of seasonal allergies, would prefer not to purchase allergy medication over-the-counter. ? ?Due to language barrier, an interpreter was present during the history-taking and subsequent discussion (and for part of the physical exam) with this patient. ?  ?Observations/Objective: ?Medical history and current medications reviewed, no physical exam completed ? ? ? ?Past Medical History:  ?Diagnosis Date  ? Allergy   ? H/O malaria 2015  ? [place]  ? ? ?Past Surgical History:  ?Procedure Laterality Date  ? NO PAST SURGERIES    ? ? ?History reviewed. No pertinent family history. ? ?Social History  ? ?Socioeconomic History  ? Marital  status: Married  ?  Spouse name: Not on file  ? Number of children: Not on file  ? Years of education: Not on file  ? Highest education level: Not on file  ?Occupational History  ? Not on file  ?Tobacco Use  ? Smoking status: Never  ? Smokeless tobacco: Never  ?Vaping Use  ? Vaping Use: Never used  ?Substance and Sexual Activity  ? Alcohol use: No  ? Drug use: No  ? Sexual activity: Yes  ?Other Topics Concern  ? Not on file  ?Social History Narrative  ? Not on file  ? ?Social Determinants of Health  ? ?Financial Resource Strain: Not on file  ?Food Insecurity: Not on file  ?Transportation Needs: Not on file  ?Physical Activity: Not on file  ?Stress: Not on file  ?Social Connections: Not on file  ?Intimate Partner Violence: Not on file  ? ? ?Outpatient Medications Prior to Visit  ?Medication Sig Dispense Refill  ? amoxicillin (AMOXIL) 500 MG capsule Take 1 capsule (500 mg total) by mouth 2 (two) times daily for 10 days. 20 capsule 0  ? cyclobenzaprine (FLEXERIL) 10 MG tablet Take 1 tablet (10 mg total) by mouth at bedtime. 30 tablet 0  ? diclofenac Sodium (VOLTAREN) 1 % GEL Apply 4 g topically 4 (four) times daily. 150 g 1  ? ibuprofen (ADVIL) 600 MG tablet Take 1 tablet (600 mg total) by mouth every 8 (eight) hours as needed. 30 tablet 0  ? ?No facility-administered medications prior to visit.  ? ? ?No Known  Allergies ? ?ROS ?Review of Systems  ?Constitutional:  Negative for chills and fever.  ?HENT:  Positive for rhinorrhea, sore throat and trouble swallowing. Negative for congestion.   ?Eyes:  Positive for discharge, redness and itching. Negative for photophobia and visual disturbance.  ?Respiratory:  Negative for cough and shortness of breath.   ?Cardiovascular:  Negative for chest pain.  ?Gastrointestinal: Negative.   ?Endocrine: Negative.   ?Genitourinary: Negative.   ?Musculoskeletal: Negative.   ?Skin: Negative.   ?Allergic/Immunologic: Negative.   ?Neurological: Negative.   ?Hematological: Negative.    ?Psychiatric/Behavioral: Negative.    ? ?  ?Objective:  ?  ? ? ?LMP 09/25/2021  ?Wt Readings from Last 3 Encounters:  ?10/21/21 184 lb 9.6 oz (83.7 kg)  ?09/10/21 183 lb (83 kg)  ?08/21/21 185 lb 12.8 oz (84.3 kg)  ? ? ? ?Health Maintenance Due  ?Topic Date Due  ? COVID-19 Vaccine (1) Never done  ? ? ?There are no preventive care reminders to display for this patient. ? ?Lab Results  ?Component Value Date  ? TSH 1.16 10/21/2016  ? ?Lab Results  ?Component Value Date  ? WBC 4.7 10/21/2021  ? HGB 12.7 10/21/2021  ? HCT 38.9 10/21/2021  ? MCV 91 10/21/2021  ? PLT 167 10/21/2021  ? ?Lab Results  ?Component Value Date  ? NA 139 10/21/2021  ? K 4.6 10/21/2021  ? CO2 23 10/21/2021  ? GLUCOSE 85 10/21/2021  ? BUN 6 10/21/2021  ? CREATININE 0.66 10/21/2021  ? BILITOT <0.2 10/21/2021  ? ALKPHOS 98 10/21/2021  ? AST 17 10/21/2021  ? ALT 12 10/21/2021  ? PROT 7.6 10/21/2021  ? ALBUMIN 4.4 10/21/2021  ? CALCIUM 8.8 10/21/2021  ? ANIONGAP 7 06/08/2017  ? EGFR 123 10/21/2021  ? ?Lab Results  ?Component Value Date  ? CHOL 159 10/21/2021  ? ?Lab Results  ?Component Value Date  ? HDL 53 10/21/2021  ? ?Lab Results  ?Component Value Date  ? [place] 93 10/21/2021  ? ?Lab Results  ?Component Value Date  ? TRIG 66 10/21/2021  ? ?Lab Results  ?Component Value Date  ? CHOLHDL 3.0 10/21/2021  ? ?Lab Results  ?Component Value Date  ? HGBA1C 5.6 08/21/2021  ? ? ?  ?Assessment & Plan:  ? ?Problem List Items Addressed This Visit   ?None ?Visit Diagnoses   ? ? Seasonal allergies    -  Primary  ? Relevant Medications  ? cetirizine (ZYRTEC) 10 MG tablet  ? Itchy eyes      ? Relevant Medications  ? olopatadine (PATADAY) 0.1 % ophthalmic solution  ? ?  ? ? ?Meds ordered this encounter  ?Medications  ? olopatadine (PATADAY) 0.1 % ophthalmic solution  ?  Sig: Place 1 drop into both eyes 2 (two) times daily.  ?  Dispense:  5 mL  ?  Refill:  12  ?  Order Specific Question:   Supervising Provider  ?  Answer:   Elsie Stain [[id number]]  ? cetirizine  (ZYRTEC) 10 MG tablet  ?  Sig: Take 1 tablet (10 mg total) by mouth daily.  ?  Dispense:  30 tablet  ?  Refill:  11  ?  Order Specific Question:   Supervising Provider  ?  Answer:   Elsie Stain [[id number]]  ? ?Assessment and Plan: ?1. Seasonal allergies ?Trial Zyrtec.  Patient education given on supportive care.  Red flags given for prompt reevaluation ?- cetirizine (ZYRTEC) 10 MG tablet; Take 1 tablet (10 mg total)  by mouth daily.  Dispense: 30 tablet; Refill: 11 ? ?2. Itchy eyes ?Trial Pataday ?- olopatadine (PATADAY) 0.1 % ophthalmic solution; Place 1 drop into both eyes 2 (two) times daily.  Dispense: 5 mL; Refill: 12 ? ?No AVS was created due to language barrier.  Patient education given via interpreter. ? ?Follow Up Instructions: ? ?  ?I discussed the assessment and treatment plan with the patient. The patient was provided an opportunity to ask questions and all were answered. The patient agreed with the plan and demonstrated an understanding of the instructions. ?  ?The patient was advised to call back or seek an in-person evaluation if the symptoms worsen or if the condition fails to improve as anticipated. ? ?I provided 16 minutes of non-face-to-face time during this encounter. ? ? ? ? ?Follow-up: Return if symptoms worsen or fail to improve.  ? ? ?Jori Thrall S Mayers, PA-C ?

## 2021-11-16 ENCOUNTER — Other Ambulatory Visit (HOSPITAL_COMMUNITY)
Admission: RE | Admit: 2021-11-16 | Discharge: 2021-11-16 | Disposition: A | Discharge status: Home / Self Care | Payer: Medicaid Other | Source: Ambulatory Visit | Attending: Primary Care | Admitting: Primary Care

## 2021-11-16 ENCOUNTER — Encounter (INDEPENDENT_AMBULATORY_CARE_PROVIDER_SITE_OTHER): Payer: Self-pay | Admitting: Primary Care

## 2021-11-16 ENCOUNTER — Ambulatory Visit (INDEPENDENT_AMBULATORY_CARE_PROVIDER_SITE_OTHER): Payer: Medicaid Other | Admitting: Primary Care

## 2021-11-16 VITALS — BP 108/72 | HR 75 | Temp 98.0°F | Ht 63.5 in | Wt 184.0 lb

## 2021-11-16 DIAGNOSIS — J302 Other seasonal allergic rhinitis: Secondary | ICD-10-CM

## 2021-11-16 DIAGNOSIS — N898 Other specified noninflammatory disorders of vagina: Secondary | ICD-10-CM

## 2021-11-16 NOTE — Progress Notes (Signed)
Purchased OTC clear eyes with no relief ? ?

## 2021-11-18 LAB — CERVICOVAGINAL ANCILLARY ONLY
Bacterial Vaginitis (gardnerella): NEGATIVE
Candida Glabrata: NEGATIVE
Candida Vaginitis: NEGATIVE
Chlamydia: NEGATIVE
Comment: NEGATIVE
Comment: NEGATIVE
Comment: NEGATIVE
Comment: NEGATIVE
Comment: NEGATIVE
Comment: NORMAL
Neisseria Gonorrhea: NEGATIVE
Trichomonas: NEGATIVE

## 2021-11-18 NOTE — Progress Notes (Signed)
?[facility] ? ?Sandra Sexton, is a 26 y.o. female ? ?VVO:[id number] ? ?YIR:[id number] ? ?DOB - 27-Mar-1996 ? ?Chief Complaint  ?Patient presents with  ? Allergies  ?  Itchy eyes  ?    ? ?Subjective:  ? ?Sandra Sexton is a 26 y.o. female here today for a follow up visit for allergies and itching eyes. She did not pick up Pataday it was OTC and did not understand she needed to pay out of her pocket. She is also concern with vaginal discharge state like mucous clots.  Patient has No headache, No chest pain, No abdominal pain - No Nausea, No new weakness tingling or numbness, No Cough - shortness of breath ? ?No problems updated. ? ?No Known Allergies ? ?Past Medical History:  ?Diagnosis Date  ? Allergy   ? H/O malaria 2015  ? [place]  ? ? ?Current Outpatient Medications on File Prior to Visit  ?Medication Sig Dispense Refill  ? cetirizine (ZYRTEC) 10 MG tablet Take 1 tablet (10 mg total) by mouth daily. 30 tablet 11  ? cyclobenzaprine (FLEXERIL) 10 MG tablet Take 1 tablet (10 mg total) by mouth at bedtime. 30 tablet 0  ? diclofenac Sodium (VOLTAREN) 1 % GEL Apply 4 g topically 4 (four) times daily. 150 g 1  ? olopatadine (PATADAY) 0.1 % ophthalmic solution Place 1 drop into both eyes 2 (two) times daily. (Patient not taking: Reported on 11/16/2021) 5 mL 12  ? ?No current facility-administered medications on file prior to visit.  ?Comprehensive ROS Pertinent positive and negative noted in HPI   ? ?Objective:  ? ?Vitals:  ? 11/16/21 1425  ?BP: 108/72  ?Pulse: 75  ?Temp: 98 ?F (36.7 ?C)  ?TempSrc: Oral  ?SpO2: 100%  ?Weight: 184 lb (83.5 kg)  ?Height: 5' 3.5" (1.613 m)  ? ? ?Exam ?General appearance : Awake, alert, not in any distress. Speech Clear. Not toxic looking ?HEENT: Atraumatic and Normocephalic, pupils equally reactive to light and accomodation ?Neck: Supple, no JVD. No cervical lymphadenopathy.  ?Chest: Good air entry bilaterally, no added sounds  ?CVS: S1 S2 regular, no  murmurs.  ?Abdomen: Bowel sounds present, Non tender and not distended with no gaurding, rigidity or rebound. ?Extremities: B/L Lower Ext shows no edema, both legs are warm to touch ?Neurology: Awake alert, and oriented X 3, CN II-XII intact, Non focal ?Skin: No Rash ? ?Data Review ?Lab Results  ?Component Value Date  ? HGBA1C 5.6 08/21/2021  ? HGBA1C 5.1 03/25/2020  ? HGBA1C 5.0 12/08/2016  ? ? ?Assessment & Plan  ? ? ?Krysti was seen today for allergies. ? ?Diagnoses and all orders for this visit: ? ?Vaginal discharge ?-     Cervicovaginal ancillary only ? ? Seasonal allergies ?Continue to take antihistamine daily or at least as the pollen is high. Purchase Pataday for eyes. ? ?Patient have been counseled extensively about nutrition and exercise. Other issues discussed during this visit include: low cholesterol diet, weight control and daily exercise, foot care, annual eye examinations at Ophthalmology, importance of adherence with medications and regular follow-up. We also discussed long term complications of uncontrolled diabetes and hypertension.  ? ?Return if symptoms worsen or fail to improve. ? ?The patient was given clear instructions to go to ER or return to medical center if symptoms don't improve, worsen or new problems develop. The patient verbalized understanding. The patient was told to call to get lab results if they haven't heard anything in the next week.  ? ?  This note has been created with Education officer, environmental. Any transcriptional errors are unintentional.  ? ?Grayce Sessions, NP ?11/18/2021, 11:21 AM  ?

## 2021-11-22 ENCOUNTER — Other Ambulatory Visit: Payer: Self-pay | Admitting: Physician Assistant

## 2021-12-05 IMAGING — DX DG ABDOMEN 1V
1 series · 1 of 1 positions shown · non-contrast
Comparison: CT abdomen pelvis 09/17/2016.

CLINICAL DATA: Abdominal pain.

EXAM:
ABDOMEN - 1 VIEW

[abdomen]
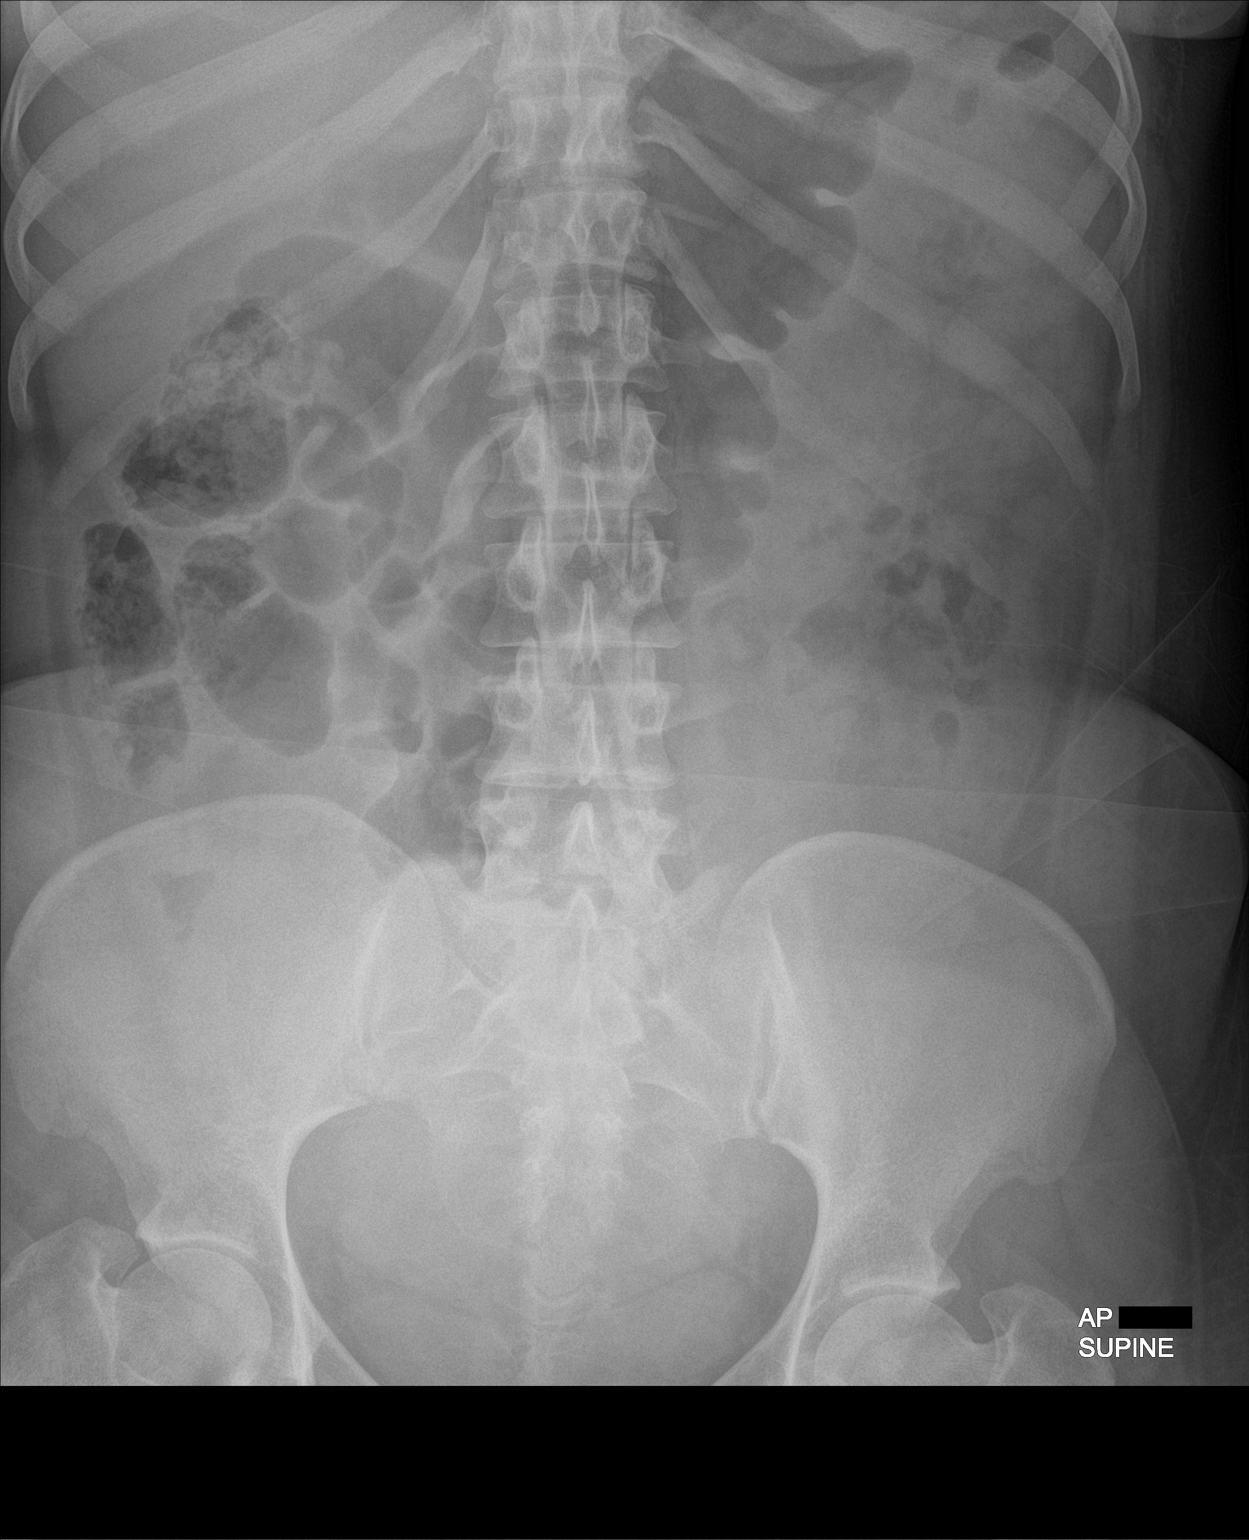

[1 of 1 positions shown; findings below may reference images not displayed]

FINDINGS: Gas is demonstrated within nondilated loops of large and small bowel
in a nonobstructed pattern. Supine evaluation limited for the
detection of free intraperitoneal air. Osseous structures
unremarkable.
IMPRESSION: Nonobstructed bowel gas pattern.

## 2021-12-08 ENCOUNTER — Ambulatory Visit: Payer: Medicaid Other | Admitting: Medical

## 2021-12-24 ENCOUNTER — Ambulatory Visit: Payer: Medicaid Other | Admitting: Family Medicine

## 2022-03-09 ENCOUNTER — Encounter: Payer: Self-pay | Admitting: Family Medicine

## 2022-03-09 ENCOUNTER — Other Ambulatory Visit (HOSPITAL_COMMUNITY)
Admission: RE | Admit: 2022-03-09 | Discharge: 2022-03-09 | Disposition: A | Discharge status: Home / Self Care | Payer: Medicaid Other | Source: Ambulatory Visit | Attending: Medical | Admitting: Medical

## 2022-03-09 ENCOUNTER — Ambulatory Visit (INDEPENDENT_AMBULATORY_CARE_PROVIDER_SITE_OTHER): Payer: Medicaid Other | Admitting: Family Medicine

## 2022-03-09 VITALS — BP 103/70 | HR 96 | Wt 182.0 lb

## 2022-03-09 DIAGNOSIS — N898 Other specified noninflammatory disorders of vagina: Secondary | ICD-10-CM | POA: Insufficient documentation

## 2022-03-09 DIAGNOSIS — R300 Dysuria: Secondary | ICD-10-CM | POA: Diagnosis not present

## 2022-03-09 NOTE — Progress Notes (Signed)
IUD placed 10/06/20

## 2022-03-09 NOTE — Assessment & Plan Note (Signed)
Concern for BV, yeast infection, STI.  I have collected wet prep which we sent off.  We will call patient with results and: New medications needed into her pharmacy.

## 2022-03-09 NOTE — Progress Notes (Signed)
    SUBJECTIVE:   CHIEF COMPLAINT / HPI:   Vaginal discharge and dysuria Patient presenting for evaluation today because she has had increased vaginal discharge for approximately 1 month.  She reports that she is sexually active and does not use protection.  She has not had any known contact with STIs.  She reports that yesterday she noticed she was also having some dysuria.  She reports that she rarely has vaginal discharge that she is concerned.  Patient does report that she had an IUD placed but has not checked her strings recently.  OBJECTIVE:   BP 103/70   Pulse 96   Wt 182 lb (82.6 kg)   BMI 31.73 kg/m   General: Pleasant, well-appearing 26 year old female in no acute distress Cardiac: Regular rate Respiratory: No work of breathing Abdomen: Soft, nontender, no costovertebral angle tenderness MSK: No gross abnormalities GU: Normal external female vaginal tissue, moderate amount of white vaginal discharge, mild cervical friability, wet prep collected  ASSESSMENT/PLAN:   Vaginal irritation Concern for BV, yeast infection, STI.  I have collected wet prep which we sent off.  We will call patient with results and: New medications needed into her pharmacy.  Dysuria Possible UTI but unsure.  Will send urine culture and treat as needed.  This could be related to vaginal infection.     Celedonio Savage, MD [facility]

## 2022-03-09 NOTE — Assessment & Plan Note (Signed)
Possible UTI but unsure.  Will send urine culture and treat as needed.  This could be related to vaginal infection.

## 2022-03-10 LAB — CERVICOVAGINAL ANCILLARY ONLY
Bacterial Vaginitis (gardnerella): NEGATIVE
Candida Glabrata: NEGATIVE
Candida Vaginitis: NEGATIVE
Chlamydia: NEGATIVE
Comment: NEGATIVE
Comment: NEGATIVE
Comment: NEGATIVE
Comment: NEGATIVE
Comment: NEGATIVE
Comment: NORMAL
Neisseria Gonorrhea: NEGATIVE
Trichomonas: NEGATIVE

## 2022-05-06 ENCOUNTER — Encounter (INDEPENDENT_AMBULATORY_CARE_PROVIDER_SITE_OTHER): Payer: Medicaid Other | Admitting: Primary Care

## 2022-08-27 ENCOUNTER — Ambulatory Visit (INDEPENDENT_AMBULATORY_CARE_PROVIDER_SITE_OTHER): Payer: Self-pay | Admitting: *Deleted

## 2022-08-27 NOTE — Telephone Encounter (Signed)
  Chief Complaint: Abdominal Pain Symptoms: 10/10 abdominal pain, "Upper and lower." Worsening "Used to come and go now all the time." Frequency:  Pertinent Negatives: Patient denies N/V/D Disposition: [x] ED /[] Urgent Care (no appt availability in office) / [] Appointment(In office/virtual)/ []  [place] [facility]/ [] Home Care/ [] Refused Recommended Disposition /[]  Mobile Bus/ []  Follow-up with PCP Additional Notes: Advised ED for 10/10 pain , states will follow disposition. Care advise provided, verbalizes understanding.  Pt's husband translating, pt present.  Reason for Disposition  [1] SEVERE pain (e.g., excruciating) AND [2] present > 1 hour  Answer Assessment - Initial Assessment Questions 1. LOCATION: "Where does it hurt?"      Upper stomach 2. RADIATION: "Does the pain shoot anywhere else?" (e.g., chest, back)     Lower in stomach 3. ONSET: "When did the pain begin?" (e.g., minutes, hours or days ago)       2 years 4. SUDDEN: "Gradual or sudden onset?"      5. PATTERN "Does the pain come and go, or is it constant?"    - If it comes and goes: "How long does it last?" "Do you have pain now?"     (Note: Comes and goes means the pain is intermittent. It goes away completely between bouts.)    - If constant: "Is it getting better, staying the same, or getting worse?"      (Note: Constant means the pain never goes away completely; most serious pain is constant and gets worse.)      Comes and goes, worsening. 6. SEVERITY: "How bad is the pain?"  (e.g., Scale 1-10; mild, moderate, or severe)    - MILD (1-3): Doesn't interfere with normal activities, abdomen soft and not tender to touch..     - MODERATE (4-7): Interferes with normal activities or awakens from sleep, abdomen tender to touch.     - SEVERE (8-10): Excruciating pain, doubled over, unable to do any normal activities.       10/10 7. RECURRENT SYMPTOM: "Have you ever had this type of stomach pain before?" If Yes,  ask: "When was the last time?" and "What happened that time?"       8. AGGRAVATING FACTORS: "Does anything seem to cause this pain?" (e.g., foods, stress, alcohol)      9. CARDIAC SYMPTOMS: "Do you have any of the following symptoms: chest pain, difficulty breathing, sweating, nausea?"      10. OTHER SYMPTOMS: "Do you have any other symptoms?" (e.g., back pain, diarrhea, fever, urination pain, vomiting)       NO 11. PREGNANCY: "Is there any chance you are pregnant?" "When was your last menstrual period?"  Protocols used: Abdominal Pain - Upper-A-AH

## 2022-08-30 NOTE — Telephone Encounter (Signed)
Noted  

## 2022-09-28 ENCOUNTER — Ambulatory Visit (INDEPENDENT_AMBULATORY_CARE_PROVIDER_SITE_OTHER): Payer: Medicaid Other | Admitting: Primary Care

## 2022-09-28 ENCOUNTER — Other Ambulatory Visit (HOSPITAL_COMMUNITY)
Admission: RE | Admit: 2022-09-28 | Discharge: 2022-09-28 | Disposition: A | Discharge status: Home / Self Care | Payer: Medicaid Other | Source: Ambulatory Visit | Attending: Primary Care | Admitting: Primary Care

## 2022-09-28 ENCOUNTER — Encounter (INDEPENDENT_AMBULATORY_CARE_PROVIDER_SITE_OTHER): Payer: Self-pay | Admitting: Primary Care

## 2022-09-28 VITALS — BP 104/69 | HR 69 | Resp 16 | Ht 62.5 in | Wt 178.2 lb

## 2022-09-28 DIAGNOSIS — N898 Other specified noninflammatory disorders of vagina: Secondary | ICD-10-CM

## 2022-09-28 DIAGNOSIS — N921 Excessive and frequent menstruation with irregular cycle: Secondary | ICD-10-CM

## 2022-09-28 DIAGNOSIS — Z131 Encounter for screening for diabetes mellitus: Secondary | ICD-10-CM | POA: Diagnosis not present

## 2022-09-28 DIAGNOSIS — Z789 Other specified health status: Secondary | ICD-10-CM | POA: Diagnosis not present

## 2022-09-28 NOTE — Progress Notes (Signed)
Sandra Sexton, is a 27 y.o. female  [id number]  [id number]  DOB - 10/05/1995        Subjective:   Sandra Sexton is a 27 y.o. Swahili female (interpreter Eustaquio Maize [phone]) here today for check up and requesting labs. She voices concerns about vaginal irritation.  Patient has No headache, No chest pain, No abdominal pain - No Nausea, No new weakness tingling or numbness, No Cough - shortness of breath  No problems updated.  No Known Allergies  Past Medical History:  Diagnosis Date   Allergy    H/O malaria 2015   [place]    Current Outpatient Medications on File Prior to Visit  Medication Sig Dispense Refill   cetirizine (ZYRTEC) 10 MG tablet Take 1 tablet (10 mg total) by mouth daily. (Patient not taking: Reported on 09/28/2022) 30 tablet 11   cyclobenzaprine (FLEXERIL) 10 MG tablet TAKE 1 TABLET BY MOUTH AT BEDTIME (Patient not taking: Reported on 09/28/2022) 30 tablet 0   diclofenac Sodium (VOLTAREN) 1 % GEL Apply 4 g topically 4 (four) times daily. (Patient not taking: Reported on 09/28/2022) 150 g 1   olopatadine (PATADAY) 0.1 % ophthalmic solution Place 1 drop into both eyes 2 (two) times daily. (Patient not taking: Reported on 11/16/2021) 5 mL 12   No current facility-administered medications on file prior to visit.    Objective:  Blood Pressure 104/69   Pulse 69   Respiration 16   Height 5' 2.5" (1.588 m)   Weight 178 lb 3.2 oz (80.8 kg)   Oxygen Saturation 99%   Body Mass Index 32.07 kg/m    Comprehensive ROS Pertinent positive and negative noted in HPI   Exam General appearance : Awake, alert, not in any distress. Speech Clear. Not toxic looking HEENT: Atraumatic and Normocephalic, pupils equally reactive to light and accomodation Bilateral cerumen inpaction Neck: Supple, no JVD. No cervical lymphadenopathy.  Chest: Good air entry bilaterally, no added sounds  CVS: S1 S2 regular, no murmurs.   Abdomen: Bowel sounds present, Non tender and not distended with no gaurding, rigidity or rebound. Extremities: B/L Lower Ext shows no edema, both legs are warm to touch Neurology: Awake alert, and oriented X 3, , Non focal Skin: No Rash  Data Review Lab Results  Component Value Date   HGBA1C 5.4 09/28/2022   HGBA1C 5.6 08/21/2021   HGBA1C 5.1 03/25/2020    Assessment & Plan  Diagnoses and all orders for this visit:  Class 2 severe obesity due to excess calories with serious comorbidity in adult, unspecified BMI ([place]) Obesity is 30-39 indicating an excess in caloric intake or underlining conditions. This may lead to other co-morbidities. Educated on lifestyle modifications of diet and exercise which may reduce obesity.    Language barrier affecting health care  Menorrhagia with irregular cycle -     CBC with Differential -     CMP14+EGFR  Screening diabetes -     Hemoglobin A1c 11 months ago 5.6  Vaginal irritation -     Cervicovaginal ancillary only    Patient have been counseled extensively about nutrition and exercise. Other issues discussed during this visit include: low cholesterol diet, weight control and daily exercise, foot care, annual eye examinations at Ophthalmology, importance of adherence with medications and regular follow-up. We also discussed long term complications of uncontrolled diabetes and hypertension.   Return for annual physical.  The patient was given clear instructions to go to ER or  return to medical center if symptoms don't improve, worsen or new problems develop. The patient verbalized understanding. The patient was told to call to get lab results if they haven't heard anything in the next week.   This note has been created with Surveyor, quantity. Any transcriptional errors are unintentional.   Kerin Perna, NP 10/03/2022, 8:29 PM

## 2022-09-29 LAB — CBC WITH DIFFERENTIAL/PLATELET
Basophils Absolute: 0 10*3/uL (ref 0.0–0.2)
Basos: 1 %
EOS (ABSOLUTE): 0 10*3/uL (ref 0.0–0.4)
Eos: 1 %
Hematocrit: 41.2 % (ref 34.0–46.6)
Hemoglobin: 12.8 g/dL (ref 11.1–15.9)
Immature Grans (Abs): 0 10*3/uL (ref 0.0–0.1)
Immature Granulocytes: 0 %
Lymphocytes Absolute: 1.7 10*3/uL (ref 0.7–3.1)
Lymphs: 44 %
MCH: 28.8 pg (ref 26.6–33.0)
MCHC: 31.1 g/dL — ABNORMAL LOW (ref 31.5–35.7)
MCV: 93 fL (ref 79–97)
Monocytes Absolute: 0.4 10*3/uL (ref 0.1–0.9)
Monocytes: 10 %
Neutrophils Absolute: 1.7 10*3/uL (ref 1.4–7.0)
Neutrophils: 44 %
Platelets: 182 10*3/uL (ref 150–450)
RBC: 4.45 x10E6/uL (ref 3.77–5.28)
RDW: 12.6 % (ref 11.7–15.4)
WBC: 3.9 10*3/uL (ref 3.4–10.8)

## 2022-09-29 LAB — CMP14+EGFR
ALT: 10 IU/L (ref 0–32)
AST: 15 IU/L (ref 0–40)
Albumin/Globulin Ratio: 1.4 (ref 1.2–2.2)
Albumin: 4.6 g/dL (ref 4.0–5.0)
Alkaline Phosphatase: 76 IU/L (ref 44–121)
BUN/Creatinine Ratio: 7 — ABNORMAL LOW (ref 9–23)
BUN: 4 mg/dL — ABNORMAL LOW (ref 6–20)
Bilirubin Total: 0.3 mg/dL (ref 0.0–1.2)
CO2: 26 mmol/L (ref 20–29)
Calcium: 9.3 mg/dL (ref 8.7–10.2)
Chloride: 101 mmol/L (ref 96–106)
Creatinine, Ser: 0.56 mg/dL — ABNORMAL LOW (ref 0.57–1.00)
Globulin, Total: 3.2 g/dL (ref 1.5–4.5)
Glucose: 89 mg/dL (ref 70–99)
Potassium: 4.5 mmol/L (ref 3.5–5.2)
Sodium: 139 mmol/L (ref 134–144)
Total Protein: 7.8 g/dL (ref 6.0–8.5)
eGFR: 127 mL/min/{1.73_m2} (ref >59)

## 2022-09-29 LAB — HEMOGLOBIN A1C
Est. average glucose Bld gHb Est-mCnc: 108 mg/dL
Hgb A1c MFr Bld: 5.4 % (ref 4.8–5.6)

## 2022-10-05 LAB — CERVICOVAGINAL ANCILLARY ONLY
Bacterial Vaginitis (gardnerella): NEGATIVE
Candida Glabrata: NEGATIVE
Candida Vaginitis: NEGATIVE
Chlamydia: NEGATIVE
Comment: NEGATIVE
Comment: NEGATIVE
Comment: NEGATIVE
Comment: NEGATIVE
Comment: NEGATIVE
Comment: NORMAL
Neisseria Gonorrhea: NEGATIVE
Trichomonas: NEGATIVE

## 2022-10-07 ENCOUNTER — Encounter: Payer: Self-pay | Admitting: Radiology

## 2022-10-20 ENCOUNTER — Encounter (INDEPENDENT_AMBULATORY_CARE_PROVIDER_SITE_OTHER): Payer: Medicaid Other | Admitting: Primary Care

## 2023-04-18 ENCOUNTER — Encounter (INDEPENDENT_AMBULATORY_CARE_PROVIDER_SITE_OTHER): Payer: Self-pay | Admitting: Primary Care

## 2023-04-18 ENCOUNTER — Other Ambulatory Visit (HOSPITAL_COMMUNITY)
Admission: RE | Admit: 2023-04-18 | Discharge: 2023-04-18 | Disposition: A | Discharge status: Home / Self Care | Payer: Medicaid Other | Source: Ambulatory Visit | Attending: Primary Care | Admitting: Primary Care

## 2023-04-18 ENCOUNTER — Ambulatory Visit (INDEPENDENT_AMBULATORY_CARE_PROVIDER_SITE_OTHER): Payer: Medicaid Other | Admitting: Primary Care

## 2023-04-18 VITALS — BP 107/72 | HR 82 | Resp 16 | Ht 62.5 in | Wt 182.2 lb

## 2023-04-18 DIAGNOSIS — Z124 Encounter for screening for malignant neoplasm of cervix: Secondary | ICD-10-CM | POA: Insufficient documentation

## 2023-04-18 NOTE — Progress Notes (Signed)
[facility]  WELL-WOMAN PHYSICAL & PAP Patient name: Sandra Sexton MRN [id number]  Date of birth: 04/17/1996 Chief Complaint:   Gynecologic Exam  History of Present Illness:   Sandra Sexton is a 27 y.o. [phone] female being seen today for a routine well-woman exam.   CC:gyn   The current method of family planning is IUD.  No LMP recorded. (Menstrual status: IUD). Last pap unknown.  Last mammogram:n/a Family h/o breast cancer: No Last colonoscopy: N/A.  Family h/o colorectal cancer: No  Review of Systems:    Denies any headaches, blurred vision, fatigue, shortness of breath, chest pain, abdominal pain, abnormal vaginal discharge/itching/odor/irritation, problems with periods, bowel movements, urination, or intercourse unless otherwise stated above.  Pertinent History Reviewed:   Reviewed past medical,surgical, social and family history.  Reviewed problem list, medications and allergies.  Physical Assessment:   Vitals:   04/18/23 1117  BP: 107/72  Pulse: 82  Resp: 16  SpO2: 97%  Weight: 182 lb 3.2 oz (82.6 kg)  Height: 5' 2.5" (1.588 m)  Body mass index is 32.79 kg/m.        Physical Examination:  General appearance - well appearing, and in no distress Mental status - alert, oriented to person, place, and time Psych:  She has a normal mood and affect Skin - warm and dry, normal color, no suspicious lesions noted Chest - effort normal, all lung fields clear to auscultation bilaterally Heart - normal rate and regular rhythm Neck:  midline trachea, no thyromegaly or nodules Breasts - breasts appear normal, no suspicious masses, no skin or nipple changes or axillary nodes Educated patient on proper self breast examination and had patient to demonstrate SBE. Abdomen - soft, nontender, nondistended, no masses or organomegaly Pelvic-VULVA: normal appearing vulva with no masses, tenderness or lesions   VAGINA: normal appearing vagina with normal color  and discharge, no lesions   CERVIX: normal appearing cervix without discharge or lesions, no CMT(IUD intact) UTERUS: uterus is felt to be normal size, shape, consistency and nontender  ADNEXA: No adnexal masses or tenderness noted. Extremities:  No swelling or varicosities noted  No results found for this or any previous visit (from the past 24 hour(s)).   Assessment & Plan:  [name] was seen today for gynecologic exam.  Diagnoses and all orders for this visit:  Cervical cancer screening -     Cervicovaginal ancillary only -     Cytology - PAP  Follow-up: Return for annual physical.  This note has been created with Dragon speech recognition software and Paediatric nurse. Any transcriptional errors are unintentional.   Grayce Sessions, NP 04/18/2023, 11:53 AM

## 2023-04-26 LAB — CYTOLOGY - PAP: Diagnosis: NEGATIVE

## 2023-06-13 NOTE — Telephone Encounter (Signed)
Error

## 2023-07-02 ENCOUNTER — Ambulatory Visit (HOSPITAL_COMMUNITY)
Admission: EM | Admit: 2023-07-02 | Discharge: 2023-07-02 | Disposition: A | Discharge status: Home / Self Care | Payer: Medicaid Other | Attending: Internal Medicine | Admitting: Internal Medicine

## 2023-07-02 ENCOUNTER — Encounter (HOSPITAL_COMMUNITY): Payer: Self-pay | Admitting: Emergency Medicine

## 2023-07-02 DIAGNOSIS — N898 Other specified noninflammatory disorders of vagina: Secondary | ICD-10-CM | POA: Insufficient documentation

## 2023-07-02 DIAGNOSIS — L219 Seborrheic dermatitis, unspecified: Secondary | ICD-10-CM | POA: Diagnosis present

## 2023-07-02 DIAGNOSIS — R1030 Lower abdominal pain, unspecified: Secondary | ICD-10-CM | POA: Diagnosis present

## 2023-07-02 DIAGNOSIS — B3731 Acute candidiasis of vulva and vagina: Secondary | ICD-10-CM | POA: Insufficient documentation

## 2023-07-02 LAB — POCT URINE PREGNANCY: Preg Test, Ur: NEGATIVE

## 2023-07-02 LAB — POCT URINALYSIS DIP (MANUAL ENTRY)
Bilirubin, UA: NEGATIVE
Blood, UA: NEGATIVE
Glucose, UA: NEGATIVE mg/dL
Ketones, POC UA: NEGATIVE mg/dL
Leukocytes, UA: NEGATIVE
Nitrite, UA: NEGATIVE
Protein Ur, POC: NEGATIVE mg/dL
Spec Grav, UA: 1.01 (ref 1.010–1.025)
Urobilinogen, UA: 0.2 U/dL
pH, UA: 6.5 (ref 5.0–8.0)

## 2023-07-02 MED ORDER — CLOBETASOL PROPIONATE 0.05 % EX SOLN: 1.0000 | Freq: Two times a day (BID) | CUTANEOUS | 3 refills | Status: DC

## 2023-07-02 MED ORDER — CLOBETASOL PROPIONATE 0.05 % EX SHAM: MEDICATED_SHAMPOO | CUTANEOUS | 3 refills | Status: DC

## 2023-07-02 MED ORDER — FLUCONAZOLE 150 MG PO TABS: 150.0000 mg | ORAL_TABLET | Freq: Every day | ORAL | 0 refills | Status: DC

## 2023-07-02 NOTE — Discharge Instructions (Addendum)
Given the vaginal discharge and irritation, will treat for possible yeast infection. Take Diflucan 150mg  today and repeat in 3 days.   For the scalp symptoms, will treat with the following:  Clobetasol shampoo, lather into hair and leave for 5 minutes, then rinse out. Do this once weekly Clobetasol external solution apply to scalp in small drops throughout the scalp twice daily for 10 days, then as needed.   Abdominal pain for several months, this less likely to be from an yeast infection. No fevers, nausea or vomiting and having bowel movements. These are good signs but the pain has been more constant recently but not much more severe. This can be followed up with your primary care doctor and recommend calling them on Monday to schedule an appointment as soon as possible for the abdominal pain.   Screening swab done today and results will be available in 24-28 hours. We will contact you if we need to arrange additional treatment based on your testing. Negative results will be on your MyChart account. If you have any worsening or changing symptoms including abnormal discharge, pelvic pain, abdominal pain, fever, nausea, or vomiting, then you should be reevaluated.

## 2023-07-02 NOTE — ED Provider Notes (Signed)
[facility]    CSN: [id number] Arrival date & time: 07/02/23  1134      History   Chief Complaint Chief Complaint  Patient presents with   Flank Pain    HPI Sandra Sexton is a 27 y.o. female.   27 year old female who is seen in urgent care secondary to several months of lower abdominal pain.  The pain is normally on and off and mild to moderate in nature.  It is isolated to the suprapubic region or lower abdominal region.  This is not associated with nausea, vomiting, dysuria, constipation or diarrhea.  She has had vaginal discharge and a foul smell from the area.  The discharge is whitish and thick in nature.  She does report the abdominal pain has been more constant in the last 24 hours but not more severe.  She recently saw her GYN and September and had a Pap smear that was normal and also cytology done at that time that was negative.  She denies fevers.  She does relate that she stays cold all the time but this is not a new issue.  Also complains of rash in the scalp region that is causing scabs and flaking. It is itchy.  This has been ongoing for several months now.   Flank Pain Pertinent negatives include no chest pain, no abdominal pain and no shortness of breath.    Past Medical History:  Diagnosis Date   Allergy    H/O malaria 2015   [place]    Patient Active Problem List   Diagnosis Date Noted   Vaginal irritation 03/09/2022   Dysuria 03/09/2022   IUD (intrauterine device) in place 10/06/2020   Language barrier affecting health care 05/20/2015    Past Surgical History:  Procedure Laterality Date   NO PAST SURGERIES      OB History     Gravida  4   Para  4   Term  4   Preterm  0   AB  0   Living  4      SAB  0   IAB  0   Ectopic  0   Multiple  0   Live Births  4            Home Medications    Prior to Admission medications   Medication Sig Start Date End Date Taking? Authorizing Provider   cetirizine (ZYRTEC) 10 MG tablet Take 1 tablet (10 mg total) by mouth daily. Patient not taking: Reported on 09/28/2022 10/29/21   Mayers, Cari S, PA-C  cyclobenzaprine (FLEXERIL) 10 MG tablet TAKE 1 TABLET BY MOUTH AT BEDTIME Patient not taking: Reported on 09/28/2022 11/23/21   Kirtland Bouchard, PA-C  diclofenac Sodium (VOLTAREN) 1 % GEL Apply 4 g topically 4 (four) times daily. Patient not taking: Reported on 09/28/2022 09/10/21   Grayce Sessions, NP  olopatadine (PATADAY) 0.1 % ophthalmic solution Place 1 drop into both eyes 2 (two) times daily. Patient not taking: Reported on 11/16/2021 10/29/21   Mayers, Kasandra Knudsen, PA-C    Family History History reviewed. No pertinent family history.  Social History Social History   Tobacco Use   Smoking status: Never   Smokeless tobacco: Never  Vaping Use   Vaping status: Never Used  Substance Use Topics   Alcohol use: No   Drug use: No     Allergies   Patient has no known allergies.   Review of Systems Review of Systems  Constitutional:  Negative for chills and fever.  HENT:  Negative for ear pain and sore throat.   Eyes:  Negative for pain and visual disturbance.  Respiratory:  Negative for cough and shortness of breath.   Cardiovascular:  Negative for chest pain and palpitations.  Gastrointestinal:  Negative for abdominal distention, abdominal pain, blood in stool, constipation, diarrhea, nausea and vomiting.  Genitourinary:  Positive for flank pain, vaginal discharge and vaginal pain (irritation). Negative for difficulty urinating, dysuria, frequency, hematuria and urgency.  Musculoskeletal:  Negative for arthralgias and back pain.  Skin:  Negative for color change and rash.       Scalp with flaky dry skin  Neurological:  Negative for seizures and syncope.  All other systems reviewed and are negative.    Physical Exam Triage Vital Signs ED Triage Vitals  Encounter Vitals Group     BP 07/02/23 1229 103/70     Systolic BP  Percentile --      Diastolic BP Percentile --      Pulse Rate 07/02/23 1229 68     Resp 07/02/23 1229 16     Temp 07/02/23 1229 98 F (36.7 C)     Temp Source 07/02/23 1229 Oral     SpO2 07/02/23 1229 99 %     Weight --      Height --      Head Circumference --      Peak Flow --      Pain Score 07/02/23 1228 7     Pain Loc --      Pain Education --      Exclude from Growth Chart --    No data found.  Updated Vital Signs BP 103/70 (BP Location: Left Arm)   Pulse 68   Temp 98 F (36.7 C) (Oral)   Resp 16   SpO2 99%   Visual Acuity Right Eye Distance:   Left Eye Distance:   Bilateral Distance:    Right Eye Near:   Left Eye Near:    Bilateral Near:     Physical Exam Vitals and nursing note reviewed.  Constitutional:      General: She is not in acute distress.    Appearance: She is well-developed.  HENT:     Head: Normocephalic and atraumatic.     Comments: Scalp with flaky dry skin    Right Ear: Tympanic membrane normal.     Left Ear: Tympanic membrane normal.  Eyes:     Conjunctiva/sclera: Conjunctivae normal.  Cardiovascular:     Rate and Rhythm: Normal rate and regular rhythm.     Heart sounds: No murmur heard. Pulmonary:     Effort: Pulmonary effort is normal. No respiratory distress.     Breath sounds: Normal breath sounds.  Abdominal:     General: Bowel sounds are normal. There is no distension.     Palpations: Abdomen is soft.     Tenderness: There is abdominal tenderness (Mild in the lower abdominal region). There is no guarding or rebound.     Hernia: No hernia is present.  Musculoskeletal:        General: No swelling.     Cervical back: Neck supple.  Skin:    General: Skin is warm and dry.     Capillary Refill: Capillary refill takes less than 2 seconds.  Neurological:     Mental Status: She is alert.  Psychiatric:        Mood and Affect: Mood normal.  UC Treatments / Results  Labs (all labs ordered are listed, but only abnormal  results are displayed) Labs Reviewed  POCT URINALYSIS DIP (MANUAL ENTRY)  POCT URINE PREGNANCY    EKG   Radiology No results found.  Procedures Procedures (including critical care time)  Medications Ordered in UC Medications - No data to display  Initial Impression / Assessment and Plan / UC Course  I have reviewed the triage vital signs and the nursing notes.  Pertinent labs & imaging results that were available during my care of the patient were reviewed by me and considered in my medical decision making (see chart for details).     Lower abdominal pain  Vaginal discharge  Candidiasis of genitalia in female  Seborrheic dermatitis of scalp   Given the vaginal discharge and irritation, will treat for possible yeast infection. Take Diflucan 150mg  today and repeat in 3 days.   For the scalp symptoms, will treat with the following:  Clobetasol shampoo, lather into hair and leave for 5 minutes, then rinse out. Do this once weekly Clobetasol external solution apply to scalp in small drops throughout the scalp twice daily for 10 days, then as needed.   Abdominal pain for several months, this less likely to be from an yeast infection. No fevers, nausea or vomiting and having bowel movements. These are good signs but the pain has been more constant recently but not much more severe. This can be followed up with your primary care doctor and recommend calling them on Monday to schedule an appointment as soon as possible for the abdominal pain.   Screening swab done today and results will be available in 24-28 hours. We will contact you if we need to arrange additional treatment based on your testing. Negative results will be on your MyChart account. If you have any worsening or changing symptoms including abnormal discharge, pelvic pain, abdominal pain, fever, nausea, or vomiting, then you should be reevaluated.    Final Clinical Impressions(s) / UC Diagnoses   Final diagnoses:   None   Discharge Instructions   None    ED Prescriptions   None    PDMP not reviewed this encounter.   Landis Martins, [place] 07/02/23 1357

## 2023-07-02 NOTE — ED Triage Notes (Signed)
Pt c/o abdominal pain, flank pain, headache, abnormal vaginal smell. First noticed symptoms a few months ago but pain came and went. Now pain is constant

## 2023-07-04 ENCOUNTER — Ambulatory Visit (INDEPENDENT_AMBULATORY_CARE_PROVIDER_SITE_OTHER): Payer: Self-pay

## 2023-07-04 LAB — CERVICOVAGINAL ANCILLARY ONLY
Bacterial Vaginitis (gardnerella): NEGATIVE
Candida Glabrata: NEGATIVE
Candida Vaginitis: NEGATIVE
Chlamydia: NEGATIVE
Comment: NEGATIVE
Comment: NEGATIVE
Comment: NEGATIVE
Comment: NEGATIVE
Comment: NEGATIVE
Comment: NORMAL
Neisseria Gonorrhea: NEGATIVE
Trichomonas: NEGATIVE

## 2023-07-04 NOTE — Telephone Encounter (Signed)
  Chief Complaint: Abdominal pain Symptoms: see ED note Frequency: a long time Pertinent Negatives: Patient denies  Disposition: [] ED /[] Urgent Care (no appt availability in office) / [x] Appointment(In office/virtual)/ []  [facility]/ [] Home Care/ [] Refused Recommended Disposition /[] [facility]/ []  Follow-up with PCP Additional Notes: Received call from pt's husband. Not on DPR. He states that pt has abdominal pain it is fairly constant, but get worse with movement. Pt was seen at ED and released and is to follow up with PCP. Appt. Made.    Reason for Disposition  [1] MODERATE pain (e.g., interferes with normal activities) AND [2] pain comes and goes (cramps) AND [3] present > 24 hours  (Exception: Pain with Vomiting or Diarrhea - see that Guideline.)  Answer Assessment - Initial Assessment Questions 1. LOCATION: "Where does it hurt?"      Lower abdomen    3. ONSET: "When did the pain begin?" (e.g., minutes, hours or days ago)      3 days 5. PATTERN "Does the pain come and go, or is it constant?"    - If it comes and goes: "How long does it last?" "Do you have pain now?"     (Note: Comes and goes means the pain is intermittent. It goes away completely between bouts.)    - If constant: "Is it getting better, staying the same, or getting worse?"      (Note: Constant means the pain never goes away completely; most serious pain is constant and gets worse.)      Fairly constant 6. SEVERITY: "How bad is the pain?"  (e.g., Scale 1-10; mild, moderate, or severe)    - MILD (1-3): Doesn't interfere with normal activities, abdomen soft and not tender to touch.     - MODERATE (4-7): Interferes with normal activities or awakens from sleep, abdomen tender to touch.     - SEVERE (8-10): Excruciating pain, doubled over, unable to do any normal activities.       moderate 7. RECURRENT SYMPTOM: "Have you ever had this type of stomach pain before?" If Yes, ask: "When was the last  time?" and "What happened that time?"      yes 8. CAUSE: "What do you think is causing the stomach pain?"     unsure 9. RELIEVING/AGGRAVATING FACTORS: "What makes it better or worse?" (e.g., antacids, bending or twisting motion, bowel movement)     Moving hurts  Protocols used: Abdominal Pain - Female-A-AH

## 2023-07-05 ENCOUNTER — Ambulatory Visit (INDEPENDENT_AMBULATORY_CARE_PROVIDER_SITE_OTHER): Payer: Medicaid Other | Admitting: Primary Care

## 2023-07-05 ENCOUNTER — Encounter (INDEPENDENT_AMBULATORY_CARE_PROVIDER_SITE_OTHER): Payer: Self-pay | Admitting: Primary Care

## 2023-07-05 VITALS — BP 103/69 | HR 87 | Resp 16 | Wt 179.8 lb

## 2023-07-05 DIAGNOSIS — Z09 Encounter for follow-up examination after completed treatment for conditions other than malignant neoplasm: Secondary | ICD-10-CM | POA: Diagnosis not present

## 2023-07-05 DIAGNOSIS — R1031 Right lower quadrant pain: Secondary | ICD-10-CM

## 2023-07-05 DIAGNOSIS — R300 Dysuria: Secondary | ICD-10-CM | POA: Diagnosis not present

## 2023-07-05 LAB — POCT URINALYSIS DIP (CLINITEK)
Bilirubin, UA: NEGATIVE
Blood, UA: NEGATIVE
Glucose, UA: NEGATIVE mg/dL
Leukocytes, UA: NEGATIVE
Nitrite, UA: NEGATIVE
POC PROTEIN,UA: NEGATIVE
Spec Grav, UA: 1.025 (ref 1.010–1.025)
Urobilinogen, UA: 0.2 U/dL
pH, UA: 7.5 (ref 5.0–8.0)

## 2023-07-05 NOTE — Patient Instructions (Signed)
Constipation, Adult Constipation is when a person has trouble pooping (having a bowel movement). When you have this condition, you may poop fewer than 3 times a week. Your poop (stool) may also be dry, hard, or bigger than normal. Follow these instructions at home: Eating and drinking  Eat foods that have a lot of fiber, such as: Fresh fruits and vegetables. Whole grains. Beans. Eat less of foods that are low in fiber and high in fat and sugar, such as: [place] fries. Hamburgers. Cookies. Candy. Soda. Drink enough fluid to keep your pee (urine) pale yellow. General instructions Exercise regularly or as told by your doctor. Try to do 150 minutes of exercise each week. Go to the restroom when you feel like you need to poop. Do not hold it in. Take over-the-counter and prescription medicines only as told by your doctor. These include any fiber supplements. When you poop: Do deep breathing while relaxing your lower belly (abdomen). Relax your pelvic floor. The pelvic floor is a group of muscles that support the rectum, bladder, and intestines (as well as the uterus in women). Watch your condition for any changes. Tell your doctor if you notice any. Keep all follow-up visits as told by your doctor. This is important. Contact a doctor if: You have pain that gets worse. You have a fever. You have not pooped for 4 days. You vomit. You are not hungry. You lose weight. You are bleeding from the opening of the butt (anus). You have thin, pencil-like poop. Get help right away if: You have a fever, and your symptoms suddenly get worse. You leak poop or have blood in your poop. Your belly feels hard or bigger than normal (bloated). You have very bad belly pain. You feel dizzy or you faint. Summary Constipation is when a person poops fewer than 3 times a week, has trouble pooping, or has poop that is dry, hard, or bigger than normal. Eat foods that have a lot of fiber. Drink enough fluid  to keep your pee (urine) pale yellow. Take over-the-counter and prescription medicines only as told by your doctor. These include any fiber supplements. This information is not intended to replace advice given to you by your health care provider. Make sure you discuss any questions you have with your health care provider. Document Revised: 06/02/2022 Document Reviewed: 06/02/2022 Elsevier Patient Education  2024 Elsevier Inc. High-Fiber Eating Plan Fiber, also called dietary fiber, is found in foods such as fruits, vegetables, whole grains, and beans. A high-fiber diet can be good for your health. Your health care provider may recommend a high-fiber diet to help: Prevent trouble pooping (constipation). Lower your cholesterol. Treat the following conditions: Hemorrhoids. This is inflammation of veins in the anus. Inflammation of specific areas of the digestive tract. Irritable bowel syndrome (IBS). This is a problem of the large intestine, also called the colon, that sometimes causes belly pain and bloating. Prevent overeating as part of a weight-loss plan. Lower the risk of heart disease, type 2 diabetes, and certain cancers. What are tips for following this plan? Reading food labels  Check the nutrition facts label on foods for the amount of dietary fiber. Choose foods that have 4 grams of fiber or more per serving. The recommended goals for how much fiber you should eat each day include: Males 38 years old or younger: 30-34 g. Males over 5 years old: 28-34 g. Females 72 years old or younger: 25-28 g. Females over 86 years old: 22-25 g. Your daily  fiber goal is _____________ g. Shopping Choose whole fruits and vegetables instead of processed. For example, choose apples instead of apple juice or applesauce. Choose a variety of high-fiber foods such as avocados, lentils, oats, and pinto beans. Read the nutrition facts label on foods. Check for foods with added fiber. These foods often  have high sugar and salt (sodium) amounts per serving. Cooking Use whole-grain flour for baking and cooking. Cook with brown rice instead of white rice. Make meals that have a lot of beans and vegetables in them, such as chili or vegetable-based soups. Meal planning Start the day with a breakfast that is high in fiber, such as a cereal that has 5 g of fiber or more per serving. Eat breads and cereals that are made with whole-grain flour instead of refined flour or white flour. Eat brown rice, bulgur wheat, or millet instead of white rice. Use beans in place of meat in soups, salads, and pasta dishes. Be sure that half of the grains you eat each day are whole grains. General information You can get the recommended amount of dietary fiber by: Eating a variety of fruits, vegetables, grains, nuts, and beans. Taking a fiber supplement if you aren't able to eat enough fiber. It's better to get fiber through food than from a supplement. Slowly increase how much fiber you eat. If you increase the amount of fiber you eat too quickly, you may have bloating, cramping, or gas. Drink plenty of water to help you digest fiber. Choose high-fiber snacks, such as berries, raw vegetables, nuts, and popcorn. What foods should I eat? Fruits Berries. Pears. Apples. Oranges. Avocado. Prunes and raisins. Dried figs. Vegetables Sweet potatoes. Spinach. Kale. Artichokes. Cabbage. Broccoli. Cauliflower. Green peas. Carrots. Squash. Grains Whole-grain breads. Multigrain cereal. Oats and oatmeal. Brown rice. Barley. Bulgur wheat. Millet. Quinoa. Bran muffins. Popcorn. Rye wafer crackers. Meats and other proteins Navy beans, kidney beans, and pinto beans. Soybeans. Split peas. Lentils. Nuts and seeds. Dairy Fiber-fortified yogurt. Fortified means that fiber has been added to the product. Beverages Fiber-fortified soy milk. Fiber-fortified orange juice. Other foods Fiber bars. The items listed above may not be all  the foods and drinks you can have. Talk to a dietitian to learn more. What foods should I avoid? Fruits Fruit juice. Cooked, strained fruit. Vegetables Fried potatoes. Canned vegetables. Well-cooked vegetables. Grains White bread. Pasta made with refined flour. White rice. Meats and other proteins Fatty meat. Fried chicken or fried fish. Dairy Milk. Cream cheese. Sour cream. Fats and oils Butters. Beverages Soft drinks. Other foods Cakes and pastries. The items listed above may not be all the foods and drinks you should avoid. Talk to a dietitian to learn more. This information is not intended to replace advice given to you by your health care provider. Make sure you discuss any questions you have with your health care provider. Document Revised: 10/11/2022 Document Reviewed: 10/11/2022 Elsevier Patient Education  2024 [employer].

## 2023-07-05 NOTE — Progress Notes (Unsigned)
[facility]   Subjective:   Sandra Sexton is a 27 y.o. female presents for ED follow up , patient presented on  07/02/23 secondary to several months of lower abdominal pain. The pain is normally on and off and mild to moderate in nature. It is isolated to the suprapubic region or lower abdominal region.  Patient is accompanied by her husband Sandra Sexton wife has given permission for him to be present and translate Swahili. Past Medical History:  Diagnosis Date   Allergy    H/O malaria 2015   [place]     No Known Allergies    Current Outpatient Medications on File Prior to Visit  Medication Sig Dispense Refill   cetirizine (ZYRTEC) 10 MG tablet Take 1 tablet (10 mg total) by mouth daily. (Patient not taking: Reported on 09/28/2022) 30 tablet 11   clobetasol (TEMOVATE) 0.05 % external solution Apply 1 Application topically 2 (two) times daily. Use twice daily for 10 days, then twice daily as needed for scalp itching/flaky. 50 mL 3   Clobetasol Propionate 0.05 % shampoo Apply to the hair in the shower and let it set for 5 minutes then rinse it out. 118 mL 3   cyclobenzaprine (FLEXERIL) 10 MG tablet TAKE 1 TABLET BY MOUTH AT BEDTIME (Patient not taking: Reported on 09/28/2022) 30 tablet 0   diclofenac Sodium (VOLTAREN) 1 % GEL Apply 4 g topically 4 (four) times daily. (Patient not taking: Reported on 09/28/2022) 150 g 1   fluconazole (DIFLUCAN) 150 MG tablet Take 1 tablet (150 mg total) by mouth daily. Take 1 tablet now and repeat in 3 days 2 tablet 0   olopatadine (PATADAY) 0.1 % ophthalmic solution Place 1 drop into both eyes 2 (two) times daily. (Patient not taking: Reported on 11/16/2021) 5 mL 12   No current facility-administered medications on file prior to visit.   Review of System: Comprehensive ROS Pertinent positive and negative noted in HPI    Objective:  BP 103/69   Pulse 87   Resp 16   Wt 179 lb 12.8 oz (81.6 kg)   SpO2 100%   BMI  32.36 kg/m   Filed Weights   07/05/23 1024  Weight: 179 lb 12.8 oz (81.6 kg)    Physical Exam: General Appearance: Well nourished, obese female in no apparent distress. Eyes: PERRLA, EOMs, conjunctiva no swelling or erythema Sinuses: No Frontal/maxillary tenderness ENT/Mouth: Ext aud canals clear, TMs without erythema, bulging. No erythema, swelling, or exudate on post pharynx.  Hearing normal.  Neck: Supple, thyroid normal.  Respiratory: Respiratory effort normal, BS equal bilaterally without rales, rhonchi, wheezing or stridor.  Cardio: RRR with no MRGs. Brisk peripheral pulses without edema.  Abdomen: Soft, + BS.  Non tender, no guarding, rebound, hernias, masses. Lymphatics: Non tender without lymphadenopathy.   Positive for flank pain  Musculoskeletal: Full ROM, 5/5 strength, normal gait.  Skin: Warm, dry without rashes, lesions, ecchymosis.  Neuro: Cranial nerves intact. Normal muscle tone, no cerebellar symptoms. Sensation intact.  Psych: Awake and oriented X 3, normal affect, Insight and Judgment appropriate.    Assessment:  Sandra Sexton was seen today for hospitalization follow-up.  Diagnoses and all orders for this visit:  Dysuria -     POCT URINALYSIS DIP (CLINITEK) Negative UTI + ketones   Hospital discharge follow-up See HPI  Right lower quadrant abdominal pain Tx with diflucan improving 2/10     This note has been created with Dragon speech recognition software and smart phrase  technology. Any transcriptional errors are unintentional.   Grayce Sessions, NP 07/05/2023, 10:37 AM

## 2023-07-22 ENCOUNTER — Ambulatory Visit (INDEPENDENT_AMBULATORY_CARE_PROVIDER_SITE_OTHER): Payer: Medicaid Other

## 2023-09-05 ENCOUNTER — Ambulatory Visit (INDEPENDENT_AMBULATORY_CARE_PROVIDER_SITE_OTHER): Payer: Medicaid Other | Admitting: Primary Care

## 2023-11-16 ENCOUNTER — Ambulatory Visit (HOSPITAL_COMMUNITY)
Admission: EM | Admit: 2023-11-16 | Discharge: 2023-11-16 | Disposition: A | Discharge status: Home / Self Care | Source: Home / Self Care

## 2023-11-16 ENCOUNTER — Encounter (HOSPITAL_COMMUNITY): Payer: Self-pay

## 2023-11-16 ENCOUNTER — Ambulatory Visit (INDEPENDENT_AMBULATORY_CARE_PROVIDER_SITE_OTHER)

## 2023-11-16 DIAGNOSIS — J209 Acute bronchitis, unspecified: Secondary | ICD-10-CM

## 2023-11-16 DIAGNOSIS — R051 Acute cough: Secondary | ICD-10-CM

## 2023-11-16 MED ORDER — PREDNISONE 20 MG PO TABS: 40.0000 mg | ORAL_TABLET | Freq: Every day | ORAL | 0 refills | Status: DC

## 2023-11-16 MED ORDER — ALBUTEROL SULFATE HFA 108 (90 BASE) MCG/ACT IN AERS: 1.0000 | INHALATION_SPRAY | Freq: Four times a day (QID) | RESPIRATORY_TRACT | 0 refills | Status: AC | PRN

## 2023-11-16 MED ORDER — ALBUTEROL SULFATE HFA 108 (90 BASE) MCG/ACT IN AERS
1.0000 | INHALATION_SPRAY | Freq: Four times a day (QID) | RESPIRATORY_TRACT | 0 refills | Status: DC | PRN
Start: 2023-11-16 — End: 2023-11-16

## 2023-11-16 MED ORDER — PREDNISONE 20 MG PO TABS: 40.0000 mg | ORAL_TABLET | Freq: Every day | ORAL | 0 refills | Status: AC

## 2023-11-16 MED ORDER — BENZONATATE 100 MG PO CAPS: 100.0000 mg | ORAL_CAPSULE | Freq: Three times a day (TID) | ORAL | 0 refills | Status: AC

## 2023-11-16 MED ORDER — BENZONATATE 100 MG PO CAPS: 100.0000 mg | ORAL_CAPSULE | Freq: Three times a day (TID) | ORAL | 0 refills | Status: DC

## 2023-11-16 NOTE — ED Provider Notes (Addendum)
 [facility]    CSN: [id number] Arrival date & time: 11/16/23  1815      History   Chief Complaint Chief Complaint  Patient presents with   Cough    HPI Sandra Sexton is a 28 y.o. female.   Patient presents to clinic today for concern of cough and congestion that has been ongoing for the past 7 days.  Cough will intermittently be productive with white phlegm.  She does feel short of breath with coughing.  Wakes up in the morning and she is okay, but throughout the day she gets tired and the cough will keep her up all night.  Has felt hot and cold, has not checked her temperature.  Denies sore throat.  Does have nasal congestion and rhinorrhea.  Has not had any nausea, vomiting or diarrhea.  Patient declines language interpreter, is able to speak [place].  The history is provided by the patient and medical records.  Cough   Past Medical History:  Diagnosis Date   Allergy    H/O malaria 2015   [place]    Patient Active Problem List   Diagnosis Date Noted   Vaginal irritation 03/09/2022   Dysuria 03/09/2022   IUD (intrauterine device) in place 10/06/2020   Language barrier affecting health care 05/20/2015    Past Surgical History:  Procedure Laterality Date   NO PAST SURGERIES      OB History     Gravida  4   Para  4   Term  4   Preterm  0   AB  0   Living  4      SAB  0   IAB  0   Ectopic  0   Multiple  0   Live Births  4            Home Medications    Prior to Admission medications   Medication Sig Start Date End Date Taking? Authorizing Provider  albuterol  (VENTOLIN  HFA) 108 (90 Base) MCG/ACT inhaler Inhale 1-2 puffs into the lungs every 6 (six) hours as needed for wheezing or shortness of breath. 11/16/23   Harlow Lighter, Jerico Grisso  N, FNP  benzonatate  (TESSALON ) 100 MG capsule Take 1 capsule (100 mg total) by mouth every 8 (eight) hours. 11/16/23   Harlow Lighter, Eldene Plocher  N, FNP  predniSONE  (DELTASONE ) 20 MG  tablet Take 2 tablets (40 mg total) by mouth daily for 5 days. 11/16/23 11/21/23  Josephina Nicks, FNP    Family History History reviewed. No pertinent family history.  Social History Social History   Tobacco Use   Smoking status: Never   Smokeless tobacco: Never  Vaping Use   Vaping status: Never Used  Substance Use Topics   Alcohol use: No   Drug use: No     Allergies   Patient has no known allergies.   Review of Systems Review of Systems  Per HPI  Physical Exam Triage Vital Signs ED Triage Vitals [11/16/23 1858]  Encounter Vitals Group     BP 97/68     Systolic BP Percentile      Diastolic BP Percentile      Pulse Rate 84     Resp 18     Temp 98.1 F (36.7 C)     Temp Source Oral     SpO2 98 %     Weight      Height      Head Circumference      Peak Flow  Pain Score 0     Pain Loc      Pain Education      Exclude from Growth Chart    No data found.  Updated Vital Signs BP 97/68 (BP Location: Left Arm)   Pulse 84   Temp 98.1 F (36.7 C) (Oral)   Resp 18   LMP 10/10/2023 (Approximate)   SpO2 98%   Visual Acuity Right Eye Distance:   Left Eye Distance:   Bilateral Distance:    Right Eye Near:   Left Eye Near:    Bilateral Near:     Physical Exam Vitals and nursing note reviewed.  Constitutional:      Appearance: Normal appearance.  HENT:     Head: Normocephalic and atraumatic.     Right Ear: External ear normal.     Left Ear: External ear normal.     Nose: Congestion and rhinorrhea present.     Mouth/Throat:     Mouth: Mucous membranes are moist.  Eyes:     Conjunctiva/sclera: Conjunctivae normal.  Cardiovascular:     Rate and Rhythm: Normal rate and regular rhythm.     Heart sounds: Normal heart sounds. No murmur heard. Pulmonary:     Effort: Pulmonary effort is normal. No respiratory distress.     Breath sounds: Normal breath sounds.  Skin:    General: Skin is warm and dry.  Neurological:     General: No focal  deficit present.     Mental Status: She is alert.  Psychiatric:        Mood and Affect: Mood normal.      UC Treatments / Results  Labs (all labs ordered are listed, but only abnormal results are displayed) Labs Reviewed - No data to display  EKG   Radiology DG Chest 2 View Result Date: 11/16/2023 CLINICAL DATA:  Cough and congestion for few days. EXAM: CHEST - 2 VIEW COMPARISON:  June 12, 2016 FINDINGS: The heart size and mediastinal contours are within normal limits. Both lungs are clear. The visualized skeletal structures are unremarkable. IMPRESSION: No active cardiopulmonary disease. Electronically Signed   By: Virgle Grime M.D.   On: 11/16/2023 21:19    Procedures Procedures (including critical care time)  Medications Ordered in UC Medications - No data to display  Initial Impression / Assessment and Plan / UC Course  I have reviewed the triage vital signs and the nursing notes.  Pertinent labs & imaging results that were available during my care of the patient were reviewed by me and considered in my medical decision making (see chart for details).  Vitals in triage reviewed, patient is hemodynamically stable.  Lungs are vesicular, heart with regular rate and rhythm.  Chest x-ray does not show any obvious infiltrate by my interpretation.  Will treat with steroid burst, inhaler and Tessalon  for acute bronchitis.  Staff will contact if radiology overread changes treatment management.  Plan of care, follow-up care return precautions given, no questions at this time.  Radiology overread shows no acute cardiopulmonary disease process, no updates to treatment plan at this time.    Final Clinical Impressions(s) / UC Diagnoses   Final diagnoses:  Acute cough  Acute bronchitis, unspecified organism     Discharge Instructions      Your chest x-ray did not show any obvious pneumonia, the radiology read will be back in the next few hours and if it comes back  abnormal you will be contacted within business hours.  You can use albuterol   inhaler as needed for any wheezing or shortness of breath.  Start the steroids tomorrow with breakfast.  Use the Tessalon  Perles every 8 hours for cough.  Ensure you are drinking at least 64 ounces of water  daily.  Sleeping with a humidifier may help loosen secretions in addition to taking 1200 mg of Mucinex daily.  Symptoms should improve over the next week or so, if no improvement or any changes return to clinic or follow-up with your primary care provider.      ED Prescriptions     Medication Sig Dispense Auth. Provider   albuterol  (VENTOLIN  HFA) 108 (90 Base) MCG/ACT inhaler  (Status: Discontinued) Inhale 1-2 puffs into the lungs every 6 (six) hours as needed for wheezing or shortness of breath. 18 g Harlow Lighter, Teneisha Gignac  N, FNP   benzonatate  (TESSALON ) 100 MG capsule  (Status: Discontinued) Take 1 capsule (100 mg total) by mouth every 8 (eight) hours. 21 capsule Harlow Lighter, Jewelle Whitner  N, FNP   predniSONE  (DELTASONE ) 20 MG tablet  (Status: Discontinued) Take 2 tablets (40 mg total) by mouth daily for 5 days. 10 tablet Harlow Lighter, Cotina Freedman  N, FNP   albuterol  (VENTOLIN  HFA) 108 (90 Base) MCG/ACT inhaler Inhale 1-2 puffs into the lungs every 6 (six) hours as needed for wheezing or shortness of breath. 18 g Harlow Lighter, Caprice Wasko  N, FNP   benzonatate  (TESSALON ) 100 MG capsule Take 1 capsule (100 mg total) by mouth every 8 (eight) hours. 21 capsule Harlow Lighter, Shakeema Lippman  N, FNP   predniSONE  (DELTASONE ) 20 MG tablet Take 2 tablets (40 mg total) by mouth daily for 5 days. 10 tablet Harlow Lighter, Zaviyar Rahal  N, FNP      PDMP not reviewed this encounter.   Harlow Lighter, Cristiano Capri  N, FNP 11/16/23 1935    Harlow Lighter, Caitlynn Ju  N, FNP 11/18/23 [id number]

## 2023-11-16 NOTE — Discharge Instructions (Addendum)
 Your chest x-ray did not show any obvious pneumonia, the radiology read will be back in the next few hours and if it comes back abnormal you will be contacted within business hours.  You can use albuterol inhaler as needed for any wheezing or shortness of breath.  Start the steroids tomorrow with breakfast.  Use the Tessalon Perles every 8 hours for cough.  Ensure you are drinking at least 64 ounces of water daily.  Sleeping with a humidifier may help loosen secretions in addition to taking 1200 mg of Mucinex daily.  Symptoms should improve over the next week or so, if no improvement or any changes return to clinic or follow-up with your primary care provider.

## 2023-11-16 NOTE — ED Triage Notes (Signed)
 Patient with c/o cough and congestion for a few days. States she is coughing up white phlegm.

## 2023-11-17 ENCOUNTER — Ambulatory Visit: Payer: Self-pay

## 2023-11-17 NOTE — Telephone Encounter (Addendum)
 Attempt 3 was made. [employer] [place] [id number] was used. Left voicemail.routing to office for follow up.    Attempt 2 made. Interpretor [place] ID [phone] stated he was able to leave voicemail.    Attempt one made. LVM. Interpretor [place] ID [id number] was used.

## 2024-09-05 ENCOUNTER — Telehealth (INDEPENDENT_AMBULATORY_CARE_PROVIDER_SITE_OTHER): Payer: Self-pay | Admitting: Primary Care

## 2024-09-05 NOTE — Telephone Encounter (Signed)
 Left VM with pt about their upcoming appt. Pt did not answer

## 2024-09-06 ENCOUNTER — Ambulatory Visit (INDEPENDENT_AMBULATORY_CARE_PROVIDER_SITE_OTHER): Admitting: Primary Care

## 2024-09-10 ENCOUNTER — Ambulatory Visit: Payer: Self-pay
